# Patient Record
Sex: Male | Born: 1935 | Race: White | Hispanic: No | Marital: Married | Smoking: Never smoker
Health system: Southern US, Community
[De-identification: ages and names within clinical notes are randomized; demographics above are authoritative.]

## PROBLEM LIST (undated history)

## (undated) ENCOUNTER — Emergency Department (HOSPITAL_COMMUNITY): Disposition: A | Discharge status: Home / Self Care | Payer: Medicare Other

## (undated) DIAGNOSIS — N400 Enlarged prostate without lower urinary tract symptoms: Secondary | ICD-10-CM

## (undated) DIAGNOSIS — I779 Disorder of arteries and arterioles, unspecified: Secondary | ICD-10-CM

## (undated) DIAGNOSIS — I639 Cerebral infarction, unspecified: Secondary | ICD-10-CM

## (undated) DIAGNOSIS — R55 Syncope and collapse: Secondary | ICD-10-CM

## (undated) DIAGNOSIS — R42 Dizziness and giddiness: Secondary | ICD-10-CM

## (undated) DIAGNOSIS — E785 Hyperlipidemia, unspecified: Secondary | ICD-10-CM

## (undated) DIAGNOSIS — T7840XA Allergy, unspecified, initial encounter: Secondary | ICD-10-CM

## (undated) DIAGNOSIS — I10 Essential (primary) hypertension: Secondary | ICD-10-CM

## (undated) DIAGNOSIS — I739 Peripheral vascular disease, unspecified: Secondary | ICD-10-CM

## (undated) DIAGNOSIS — R0609 Other forms of dyspnea: Secondary | ICD-10-CM

## (undated) DIAGNOSIS — I8390 Asymptomatic varicose veins of unspecified lower extremity: Secondary | ICD-10-CM

## (undated) DIAGNOSIS — M1990 Unspecified osteoarthritis, unspecified site: Secondary | ICD-10-CM

## (undated) DIAGNOSIS — Z1211 Encounter for screening for malignant neoplasm of colon: Secondary | ICD-10-CM

## (undated) DIAGNOSIS — R0600 Dyspnea, unspecified: Secondary | ICD-10-CM

## (undated) DIAGNOSIS — C61 Malignant neoplasm of prostate: Secondary | ICD-10-CM

## (undated) DIAGNOSIS — K469 Unspecified abdominal hernia without obstruction or gangrene: Secondary | ICD-10-CM

## (undated) HISTORY — DX: Encounter for screening for malignant neoplasm of colon: Z12.11

## (undated) HISTORY — DX: Cerebral infarction, unspecified: I63.9

## (undated) HISTORY — DX: Dyspnea, unspecified: R06.00

## (undated) HISTORY — DX: Malignant neoplasm of prostate: C61

## (undated) HISTORY — DX: Essential (primary) hypertension: I10

## (undated) HISTORY — DX: Unspecified osteoarthritis, unspecified site: M19.90

## (undated) HISTORY — DX: Allergy, unspecified, initial encounter: T78.40XA

## (undated) HISTORY — DX: Syncope and collapse: R55

## (undated) HISTORY — DX: Unspecified abdominal hernia without obstruction or gangrene: K46.9

## (undated) HISTORY — DX: Hyperlipidemia, unspecified: E78.5

## (undated) HISTORY — PX: TOTAL HIP ARTHROPLASTY: SHX124

## (undated) HISTORY — DX: Dizziness and giddiness: R42

## (undated) HISTORY — DX: Asymptomatic varicose veins of unspecified lower extremity: I83.90

## (undated) HISTORY — DX: Disorder of arteries and arterioles, unspecified: I77.9

## (undated) HISTORY — DX: Other forms of dyspnea: R06.09

## (undated) HISTORY — PX: VEIN LIGATION AND STRIPPING: SHX2653

## (undated) HISTORY — DX: Benign prostatic hyperplasia without lower urinary tract symptoms: N40.0

## (undated) HISTORY — PX: PROSTATE BIOPSY: SHX241

## (undated) HISTORY — DX: Peripheral vascular disease, unspecified: I73.9

## (undated) HISTORY — PX: INGUINAL HERNIA REPAIR: SUR1180

---

## 1979-08-21 HISTORY — PX: HEMORRHOID SURGERY: SHX153

## 1990-12-20 HISTORY — PX: VEIN LIGATION AND STRIPPING: SHX2653

## 1997-12-20 HISTORY — PX: LUMBAR MICRODISCECTOMY: SHX99

## 1998-10-10 ENCOUNTER — Encounter: Payer: Self-pay | Admitting: Neurosurgery

## 1998-10-10 ENCOUNTER — Ambulatory Visit (HOSPITAL_COMMUNITY): Admission: RE | Admit: 1998-10-10 | Discharge: 1998-10-10 | Payer: Self-pay | Admitting: Neurosurgery

## 1998-11-03 ENCOUNTER — Ambulatory Visit (HOSPITAL_COMMUNITY): Admission: RE | Admit: 1998-11-03 | Discharge: 1998-11-03 | Payer: Self-pay | Admitting: Sports Medicine

## 1998-11-03 ENCOUNTER — Encounter: Payer: Self-pay | Admitting: Sports Medicine

## 1998-11-05 ENCOUNTER — Ambulatory Visit (HOSPITAL_COMMUNITY): Admission: RE | Admit: 1998-11-05 | Discharge: 1998-11-05 | Payer: Self-pay | Admitting: Neurology

## 1998-11-05 ENCOUNTER — Encounter: Payer: Self-pay | Admitting: Neurology

## 1998-11-19 HISTORY — PX: ANTERIOR CERVICAL DECOMP/DISCECTOMY FUSION: SHX1161

## 1998-11-26 ENCOUNTER — Encounter: Payer: Self-pay | Admitting: Neurosurgery

## 1998-12-01 ENCOUNTER — Encounter: Payer: Self-pay | Admitting: Neurosurgery

## 1998-12-01 ENCOUNTER — Inpatient Hospital Stay (HOSPITAL_COMMUNITY): Admission: RE | Admit: 1998-12-01 | Discharge: 1998-12-02 | Payer: Self-pay | Admitting: Neurosurgery

## 1998-12-25 ENCOUNTER — Ambulatory Visit (HOSPITAL_COMMUNITY): Admission: RE | Admit: 1998-12-25 | Discharge: 1998-12-25 | Payer: Self-pay | Admitting: Neurosurgery

## 1998-12-25 ENCOUNTER — Encounter: Payer: Self-pay | Admitting: Neurosurgery

## 1999-01-02 ENCOUNTER — Ambulatory Visit (HOSPITAL_COMMUNITY): Admission: RE | Admit: 1999-01-02 | Discharge: 1999-01-02 | Payer: Self-pay | Admitting: Neurosurgery

## 1999-01-15 ENCOUNTER — Encounter: Admission: RE | Admit: 1999-01-15 | Discharge: 1999-04-15 | Payer: Self-pay | Admitting: Neurosurgery

## 1999-02-03 ENCOUNTER — Encounter: Payer: Self-pay | Admitting: Neurosurgery

## 1999-02-03 ENCOUNTER — Ambulatory Visit (HOSPITAL_COMMUNITY): Admission: RE | Admit: 1999-02-03 | Discharge: 1999-02-03 | Payer: Self-pay | Admitting: Neurosurgery

## 1999-03-10 ENCOUNTER — Encounter: Payer: Self-pay | Admitting: Sports Medicine

## 1999-03-10 ENCOUNTER — Ambulatory Visit (HOSPITAL_COMMUNITY): Admission: RE | Admit: 1999-03-10 | Discharge: 1999-03-10 | Payer: Self-pay | Admitting: Sports Medicine

## 1999-03-30 ENCOUNTER — Inpatient Hospital Stay (HOSPITAL_COMMUNITY): Admission: RE | Admit: 1999-03-30 | Discharge: 1999-04-02 | Payer: Self-pay | Admitting: Orthopedic Surgery

## 1999-03-30 ENCOUNTER — Encounter: Payer: Self-pay | Admitting: Orthopedic Surgery

## 1999-04-02 ENCOUNTER — Inpatient Hospital Stay (HOSPITAL_COMMUNITY)
Admission: RE | Admit: 1999-04-02 | Discharge: 1999-04-10 | Payer: Self-pay | Admitting: Physical Medicine & Rehabilitation

## 1999-04-03 ENCOUNTER — Encounter: Payer: Self-pay | Admitting: Physical Medicine & Rehabilitation

## 1999-04-06 ENCOUNTER — Encounter: Payer: Self-pay | Admitting: Physical Medicine & Rehabilitation

## 1999-06-08 ENCOUNTER — Encounter: Admission: RE | Admit: 1999-06-08 | Discharge: 1999-07-20 | Payer: Self-pay | Admitting: Orthopedic Surgery

## 1999-07-21 ENCOUNTER — Encounter: Admission: RE | Admit: 1999-07-21 | Discharge: 1999-07-31 | Payer: Self-pay | Admitting: Orthopedic Surgery

## 2000-09-23 ENCOUNTER — Encounter: Payer: Self-pay | Admitting: Orthopedic Surgery

## 2000-09-29 ENCOUNTER — Inpatient Hospital Stay (HOSPITAL_COMMUNITY): Admission: RE | Admit: 2000-09-29 | Discharge: 2000-10-04 | Payer: Self-pay | Admitting: Orthopedic Surgery

## 2000-09-29 ENCOUNTER — Encounter: Payer: Self-pay | Admitting: Orthopedic Surgery

## 2001-03-21 ENCOUNTER — Encounter (INDEPENDENT_AMBULATORY_CARE_PROVIDER_SITE_OTHER): Payer: Self-pay | Admitting: Specialist

## 2001-03-21 ENCOUNTER — Other Ambulatory Visit: Admission: RE | Admit: 2001-03-21 | Discharge: 2001-03-21 | Payer: Self-pay | Admitting: Gastroenterology

## 2004-12-20 DIAGNOSIS — K469 Unspecified abdominal hernia without obstruction or gangrene: Secondary | ICD-10-CM

## 2004-12-20 HISTORY — DX: Unspecified abdominal hernia without obstruction or gangrene: K46.9

## 2005-02-09 ENCOUNTER — Ambulatory Visit: Payer: Self-pay | Admitting: Family Medicine

## 2005-02-23 ENCOUNTER — Ambulatory Visit: Payer: Self-pay | Admitting: Family Medicine

## 2005-12-21 ENCOUNTER — Other Ambulatory Visit: Payer: Self-pay

## 2005-12-21 ENCOUNTER — Ambulatory Visit: Payer: Self-pay | Admitting: General Surgery

## 2006-02-17 HISTORY — PX: CARDIAC CATHETERIZATION: SHX172

## 2006-03-03 ENCOUNTER — Ambulatory Visit: Payer: Self-pay | Admitting: General Surgery

## 2006-03-23 ENCOUNTER — Ambulatory Visit: Payer: Self-pay | Admitting: Family Medicine

## 2006-12-20 DIAGNOSIS — I639 Cerebral infarction, unspecified: Secondary | ICD-10-CM

## 2006-12-20 DIAGNOSIS — R42 Dizziness and giddiness: Secondary | ICD-10-CM

## 2006-12-20 HISTORY — DX: Cerebral infarction, unspecified: I63.9

## 2006-12-20 HISTORY — DX: Dizziness and giddiness: R42

## 2006-12-20 HISTORY — PX: COLONOSCOPY: SHX174

## 2007-08-14 ENCOUNTER — Ambulatory Visit: Payer: Self-pay | Admitting: Gastroenterology

## 2007-08-28 ENCOUNTER — Encounter: Payer: Self-pay | Admitting: Gastroenterology

## 2007-08-28 ENCOUNTER — Ambulatory Visit: Payer: Self-pay | Admitting: Gastroenterology

## 2007-10-26 ENCOUNTER — Inpatient Hospital Stay (HOSPITAL_COMMUNITY): Admission: EM | Admit: 2007-10-26 | Discharge: 2007-10-30 | Payer: Self-pay | Admitting: Emergency Medicine

## 2007-10-26 ENCOUNTER — Encounter (INDEPENDENT_AMBULATORY_CARE_PROVIDER_SITE_OTHER): Payer: Self-pay | Admitting: *Deleted

## 2007-10-30 ENCOUNTER — Encounter (INDEPENDENT_AMBULATORY_CARE_PROVIDER_SITE_OTHER): Payer: Self-pay | Admitting: Internal Medicine

## 2007-11-07 ENCOUNTER — Inpatient Hospital Stay (HOSPITAL_COMMUNITY): Admission: EM | Admit: 2007-11-07 | Discharge: 2007-11-09 | Payer: Self-pay | Admitting: Emergency Medicine

## 2007-11-13 HISTORY — PX: CARDIOVASCULAR STRESS TEST: SHX262

## 2008-02-29 ENCOUNTER — Ambulatory Visit: Payer: Self-pay | Admitting: General Surgery

## 2008-03-07 ENCOUNTER — Ambulatory Visit: Payer: Self-pay | Admitting: General Surgery

## 2008-11-26 ENCOUNTER — Inpatient Hospital Stay (HOSPITAL_COMMUNITY): Admission: EM | Admit: 2008-11-26 | Discharge: 2008-11-28 | Payer: Self-pay | Admitting: Emergency Medicine

## 2008-12-20 HISTORY — PX: CATARACT EXTRACTION W/ INTRAOCULAR LENS  IMPLANT, BILATERAL: SHX1307

## 2010-07-23 ENCOUNTER — Ambulatory Visit: Payer: Self-pay | Admitting: Cardiology

## 2011-05-04 ENCOUNTER — Ambulatory Visit: Payer: Self-pay | Admitting: Gastroenterology

## 2011-05-04 NOTE — H&P (Signed)
NAMEDIONICIO, SHELNUTT NO.:  [id number]   MEDICAL RECORD NO.:  [id number]          PATIENT TYPE:  INP   LOCATION:  3705                         FACILITY:  [facility]   PHYSICIAN:  Colleen Can. Deborah Chalk, M.D.DATE OF BIRTH:  [birth year]   DATE OF ADMISSION:  11/07/2007  DATE OF DISCHARGE:                              HISTORY & PHYSICAL   CHIEF COMPLAINT:  Syncope.   HISTORY OF PRESENT ILLNESS:  Sohail is a pleasant 77 year old white male  who was just discharged from the hospital approximately 8 days ago after  having a cerebellar stroke.  At that time he presented with acute  vertigo.  He really had no residual neurologic deficits.  He was noted  to be hypertensive during that admission and was started on Avalide  150/12.5 mg.  Since his discharge time he has basically returned to his  normal activities.  He actually took his wife to a doctor's appointment  yesterday.  He is back driving without problems.  Today he took his  blood pressure and noted it was somewhat low at approximately 106  systolic.  He was feeling dopey-headed.  His daughter called the house  and the patient's wife informed her that he had had several syncopal  episodes.  His blood pressure apparently became lower at 68/40.  EMS was  called.  He was sitting in a chair upon their arrival and then had  another witnessed episode apparently of snoring respirations.  Apparently his heart rate dropped down into the 30s and blood pressure  remained low as well.  Those strips are not available at the present for  review.  He was subsequently brought to the emergency room.  He is  currently in no acute distress.  He is pain-free and neurologically  intact.   PAST MEDICAL HISTORY:  1. Recent cerebellar stroke.  2. Hypertension, recently started on Avalide.  3. Hypercholesterolemia, recently started on Zocor.  4. Osteoarthritis.  5. History of multiple vein strippings due to varicose veins.  6. Hemorrhoid  surgery.  7. Lumbar microdiskectomy.  8. Anterior C5-6 diskectomy.  9. Bilateral hip surgery.  10.Right inguinal hernia surgery.   ALLERGIES:  DARVOCET.   MEDICINES:  1. Aspirin 325 mg a day.  2. Avalide 150/12.5 mg daily.  3. Simvastatin 40 mg a day.   FAMILY HISTORY:  Unchanged.   SOCIAL HISTORY:  He is married.  He is retired from [employer].  He has  no alcohol or tobacco use.   REVIEW OF SYSTEMS:  As noted above and is otherwise unremarkable.   EXAM:  He is pleasant, elderly white male who is in no acute distress.  Blood pressure is 137/68, his heart rate is in the 60s, respiratory rate  is 18, he is afebrile.  SKIN:  Warm and dry.  Color is unremarkable.  LUNGS:  Fairly clear.  HEART:  A regular rhythm.  ABDOMEN:  Soft.  EXTREMITIES:  Without edema.  NEUROLOGIC:  No gross focal deficits.   Labs are pending.  His EKG shows first-degree AV block.   OVERALL IMPRESSION:  1. Syncope.  2. Recent cerebellar stroke.  3. Hypertension, recently started on Avalide.  4. Hyperlipidemia.   PLAN:  He will be admitted to the service of Dr. Ronny Flurry.  He will  be placed on telemetry.  Avalide will be held.  We will check  orthostatic blood pressures.  The patient may, in fact, need permanent  pacemaker implantation.      Sharlee Blew, N.P.      Colleen Can. Deborah Chalk, M.D.  Electronically Signed    LC/MEDQ  D:  11/07/2007  T:  11/08/2007  Job:  [id number]   cc:   Cassell Clement, M.D.

## 2011-05-04 NOTE — H&P (Signed)
Matthew Bright, Matthew Bright                ACCOUNT NO.:  [id number]   MEDICAL RECORD NO.:  [id number]          PATIENT TYPE:  OBV   LOCATION:  6707                         FACILITY:  [facility]   PHYSICIAN:  Darryl D. Prime, MD    DATE OF BIRTH:  09-26-1926   DATE OF ADMISSION:  11/25/2008  DATE OF DISCHARGE:                              HISTORY & PHYSICAL   PRIMARY CARE PHYSICIAN:  Dr. Chandra Batch.   CARDIOLOGIST:  Dr. Patty Sermons.   Patient is full code.   CHIEF COMPLAINT:  Passed out.   HISTORY OF PRESENT ILLNESS:  Mr. Matthew Bright is an 77 year old male with a  history of possible vasovagal syncope who was at a meeting at church and  think he ate too much.  He had a huge meal and shortly thereafter felt  weak and groggy, then passed out.  A former nurse there noted he did not  have a pulse for about a minute.  He was brought to the emergency room  here.  He came to after a minute with no signs of depressive  neurological status.  He denies any chest pain or shortness of breath  prior to the episode.  In the emergency room, he was given aspirin and  IV fluids.  He was not orthostatic there.   PAST MEDICAL HISTORY/PAST SURGICAL HISTORY:  1. He has a history of syncope believed to be possibly vasovagal in      November 2008 having negative cath in 2007.  2. The patient has a history of orthostatic hypertension related to      AVALIDE which was discontinued.  3. History of hyperlipidemia.  4. History of multiple vein stripping due to varicose veins.  5. He has a history of possible cerebella stroke in 2008.  6. History of hemorrhoid surgery.  7. History of anterior C5-C6 diskectomy.  8. History of bilateral hip surgery.  9. He has a history of right inguinal hernia surgery.  10.History of lumbar microlaminectomy.  11.He has a history of PFO by echo.  EF 60% at that time which was in      November 2008.   ALLERGIES:  He is allergic to DARVOCET.   MEDICATIONS HE IS ON:  1. Lisinopril 5 mg  daily.  2. He is on lovastatin 20 mg daily.   FAMILY HISTORY:  Positive for coronary artery disease in his siblings.   SOCIAL HISTORY:  He is married with no history of tobacco, alcohol or  drug use.  He used to work and [employer].   REVIEW OF SYSTEMS:  A 14-point review of systems is negative or as  stated above.   PHYSICAL EXAMINATION:  VITAL SIGNS:  Temperature is 97.9 with a blood  pressure of 142/73.  Pulse is 70.  Respiratory rate of 16.  Sats are 98%  on room air.  GENERAL:  Patient is a male who looks much younger than his stated age  lying flat in bed in no acute distress.  HEENT:  Normocephalic, atraumatic.  Pupils are equal, round and reactive  to light.  Extraocular movements intact.  The  oropharynx shows no  posterior pharyngeal lesions.  NECK:  Supple with no lymphadenopathy or thyromegaly.  No carotid  bruits.  No jugular venous distention.  LUNGS:  Clear to auscultation bilaterally.  CARDIOVASCULAR:  Regular rhythm and rate with no murmurs, rubs or  gallops.  Normal S1, S2.  No S3 or S4.  ABDOMEN:  Soft, nontender, nondistended.  No hepatosplenomegaly.  EXTREMITIES:  No clubbing, cyanosis or edema.  NEUROLOGIC:  He is alert and oriented x4 with cranial nerves 2-12  grossly intact.  Strength and sensation are grossly intact.   LABORATORY DATA:  Shows a sodium of 137 with a potassium of 4.3,  chloride 104, bicarb of 25, BUN 19, creatinine of 1.45.  The increase  significantly compared to 10/2007 creatinine which was 1.  White count  of 9.3 with a hemoglobin of 13.6, hematocrit 41, platelets 223, calcium  is 9.3, cardiac markers at 2200 for a CK-MB of 9.3.  Chest x-ray is  negative.  EKG shows normal sinus rhythm with a vent rate of 77 beats  per minute, PR interval 217 with a left anterior vesicular block.   ASSESSMENT AND PLAN:  This is a patient, 77 year old male with a history  of possible vasovagal syncope who after eating a heavy meal, much  heavier than he  usually eats, had another episode of syncope.  He came  to quickly.  EKG is unremarkable.  He had no chest pain prior or after.  He does not have orthostatic hypotension at this time, although he was  given IV fluids in the emergency room.  He has hypertension.  At this  time, we will give him IV fluids if he does have signs of azotemia and  will place him on telemetry for at least 24 hours to rule out  arrhythmias.  He will likely be okay to have him discharged on his home  medications as prescribed.  If his blood pressure continues to be  difficult to control, we may get cardiology involved given the  combination of vasovagal episodes and hypertension.      Darryl D. Prime, MD  Electronically Signed     DDP/MEDQ  D:  11/26/2008  T:  11/26/2008  Job:  [id number]

## 2011-05-04 NOTE — Consult Note (Signed)
Matthew Bright, RACCA NO.:  [id number]   MEDICAL RECORD NO.:  [id number]          PATIENT TYPE:  INP   LOCATION:  6524                         FACILITY:  [facility]   PHYSICIAN:  Cassell Clement, M.D. DATE OF BIRTH:  12/01/1926   DATE OF CONSULTATION:  10/26/2007  DATE OF DISCHARGE:                                 CONSULTATION   HISTORY:  This is a 77 year old Caucasian gentleman, a resident of  [place], [place], who was admitted to [facility] with acute vertigo  and was found incidentally to have an elevated CK-MB and total CK but  normal troponin-I.  There is no history of any known prior heart  disease.  The patient gives a history that he had a cardiac  catheterization at [facility] in 2007 and was found to have no significant  disease at that time and was allowed to proceed with elective inguinal  herniorrhaphy after the catheterization results were known.  The patient  denies any history of chest pain or angina or exertional dyspnea.   FAMILY HISTORY:  Positive for coronary disease in siblings, but all of  them were heavy smokers.   SOCIAL HISTORY:  He lives in [place].  He is retired.  He does not  use alcohol or tobacco.   PAST MEDICAL HISTORY:  Multiple orthopedic surgical procedures.  The  patient also has a past history of vertigo, usually responsive to  Antivert but he did not have any with him this time.   REVIEW OF SYSTEMS:  Otherwise unremarkable.   PHYSICAL EXAM:  Blood pressure 140/50, pulse 73, regular, normal sinus  rhythm, respirations are normal.  SKIN:  Clear.  HEENT:  Negative.  No nystagmus.  Carotids normal.  Jugular venous pressure normal.  Thyroid normal.  CHEST:  Clear.  HEART:  No murmur, gallop, rub or click.  ABDOMEN:  Soft.  Liver and spleen not enlarged.  EXTREMITIES:  Good pedal pulses.  No edema or phlebitis.   His electrocardiogram taken today at 0626 as well as a repeat at 2032  show normal sinus rhythm, left anterior  hemiblock, variable degree of  incomplete right bundle branch block with poor R-wave progression, V1  through V4, but no acute ST-T wave changes.  There is been no  significant change in the tracing since previous EKG of September 23, 2000.   His 2-D echo shows normal left ventricular systolic function with an  ejection fraction of 60% and no wall motion abnormalities.   Chest x-ray not yet done and will be done this evening.   LABORATORY STUDIES:  CK and CK-MB of 11.6 and 12.7 with troponin-I of  0.03 and 0.03.   IMPRESSION:  1. Vertigo with possible recent underlying stroke.  2. No good evidence for an acute myocardial infarction with slight      bump in MB but normal troponin, no EKG changes, no chest pain and      essentially normal catheterization in 2007.  It may be that the      slight bump in MB reflects some mild brain damage from recent  stroke.   RECOMMENDATIONS:  Chest x-ray will be done tonight.  We will check  another set of enzymes in the morning as well as get a last EKG in the  morning.  At this point there is no indication for recatheterization.  Would consider a follow-up Cardiolite stress test as an outpatient in  the office after his acute neurologic workup and vertigo problems have  been resolved.  Would certainly continue him on daily aspirin.   Thanks for the opportunity to see this gentleman with you.           ______________________________  Cassell Clement, M.D.     TB/MEDQ  D:  10/26/2007  T:  10/27/2007  Job:  [id number]   cc:   [employer]

## 2011-05-04 NOTE — H&P (Signed)
NAMETANISH, PRIEN NO.:  [id number]   MEDICAL RECORD NO.:  [id number]          PATIENT TYPE:  EMS   LOCATION:  MAJO                         FACILITY:  [facility]   PHYSICIAN:  Michaelyn Barter, M.D. DATE OF BIRTH:  [birth year]   DATE OF ADMISSION:  10/26/2007  DATE OF DISCHARGE:                              HISTORY & PHYSICAL   PRIMARY CARE PHYSICIAN:  Unassigned.   CHIEF COMPLAINT:  Dizziness.   HISTORY OF PRESENT ILLNESS:  Mr. Lennox is a 77 year old gentleman who  states that he felt fine up until approximately 9:00 to 10:00 this  evening.  He indicates that he was lying in bed when he suddenly began  to feel dizzy.  He attempted to sit alongside of the bed but indicates  that the dizziness became worse.  He indicates that whenever he would  attempt to turn his head from side-to-side, he would become  significantly more dizzy.  After sitting up and feeling dizzy, he  decided to lay back down.  While sitting up, he began to feel clammy.  Therefore, he decided to call EMS.  He states that earlier today he felt  fine.  He attended his typical church/bible study and was okay.  He  states that he has had some dry heaves.  He denies any shortness of  breath.  No fevers or chills.  No diarrhea.  No other complaints.   PAST MEDICAL HISTORY:  1. Multiple vein stripping in both of his lower extremities secondary      to varicose veins.  2. Hemorrhoid repair.  3. Lumbar microdiskectomy.  4. Anterior C5-6 diskectomy.  5. Right hip repair by Dr. Eulah Pont.  6. Left hip repair by Dr. Eulah Pont.  7. Right inguinal hernia repair.   HOSPITAL MEDICATIONS:  None.   ALLERGIES:  DARVOCET.   SOCIAL HISTORY:  Cigarettes the patient denies.  Alcohol the patient  denies.   FAMILY HISTORY:  Father had a heart attack, had multiple CVAs.  He died  at the age of 52 secondary to CVA.  Mother died at age 70 after an MI.  Brother had bone cancer.   REVIEW OF SYSTEMS:  As per HPI.   PHYSICAL EXAMINATION:  GENERAL:  The patient is awake.  He is lying  flat.  He shows no obvious respiratory distress.  He is cooperative.  VITAL SIGNS:  Blood pressure 173/86, temperature 96.7, heart rate 64,  respirations 19, O2 saturation 100%.  HEENT:  Normocephalic, atraumatic.  Anicteric.  Extraocular movements  intact.  Oral mucosa is pink.  No exudates.  Partials are present.  NECK:  No JVD.  Supple.  No lymphadenopathy.  No thyromegaly.  CARDIAC:  S1, S2 present.  Regular rate and rhythm.  No murmurs,  gallops, rubs.  ABDOMEN:  Flat, soft, nontender, nondistended.  Positive bowel sounds x4  quadrants.  No masses palpated.  No hepatosplenomegaly .  EXTREMITIES:  Trace bilateral leg edema.  NEUROLOGICAL:  The patient is alert and oriented x3, cranial nerves II-  XII are intact excluding gag reflexes not tested.  There is no facial  droop.  No focal motor deficit.  Grip strength is equal bilaterally.   LABORATORY DATA:  The patient's sodium is 135, potassium 3.7, chloride  102, CO2 25, glucose 126, BUN 19, creatinine 0.90, bilirubin total 0.7,  alk-phos 57, SGOT 35, SGPT 26, total protein 6.5, albumin 3.6, calcium  8.7.  PTT 27, PT 13.9, INR 1.1.  White blood cell count is 12.1,  hemoglobin 12.7, hematocrit 38.5, platelets 229.  CT scan of the  patient's head revealed hypoattenuation in the inferior right cerebellar  hemisphere.  Acute ischemia could have this appearance.  MRI was  recommended.   ASSESSMENT/PLAN:  1. Acute onset of vertigo.  Etiology of this is questionable.  The      patient indicated that she had vertigo in the past.  CT scan of the      patient's head has been completed.  Findings are somewhat      inconclusive.  Will order a MRI of the head to further delineate      the questionable findings on CT scan of patient's head.  Will      provide the patient with IV fluids for now.  Will start Antivert.      Will check a 2D echocardiogram, carotid Dopplers as well  as a TSH,      T4, RPR and B-12.  2. Nausea.  Will provide the patient with p.r.n. antiemetics for now.  3. Gastrointestinal prophylaxis.  Will provide Protonix.  4. Deep vein thrombosis prophylaxis.  Will provide Lovenox.      Michaelyn Barter, M.D.  Electronically Signed     OR/MEDQ  D:  10/26/2007  T:  10/26/2007  Job:  [id number]

## 2011-05-04 NOTE — Discharge Summary (Signed)
NAMECAEDON, BOND NO.:  [id number]   MEDICAL RECORD NO.:  [id number]          PATIENT TYPE:  INP   LOCATION:  3705                         FACILITY:  [facility]   PHYSICIAN:  Cassell Clement, M.D. DATE OF BIRTH:  1926-01-29   DATE OF ADMISSION:  11/07/2007  DATE OF DISCHARGE:  11/09/2007                               DISCHARGE SUMMARY   FINAL DIAGNOSES:  1. Syncope.  2. Recent cerebellar strokes.  3. Hypercholesterolemia.  4. Orthostatic hypotension  5. History of multiple vein stripping in the past.   OPERATIONS PERFORMED:  None.   HISTORY OF PRESENT ILLNESS:  This 77 year old gentleman presented to the  emergency room after having episode of syncope at home.  He had been  discharged 8 days previously after being hospitalized on the hospitalist  service for acute vertigo and was diagnosed as having recent cerebellar  strokes.  No source of his strokes was found, and the patient was  discharged on aspirin.  He was also discharged at that time on Avalide,  Zocor and aspirin.  He had been doing well until the day of admission  when he developed dizziness and subsequent syncope at home.  He was  suspected of having orthostatic hypertension.  He came to emergency room  where he was evaluated by Dr. Deborah Chalk and admitted.  His heart rhythm  remained stable during the hospital stay orthostatic blood pressures  were monitored and once Avalide was stopped, there was no evidence of  any significant orthostatic drop.  He did have a slight bump in his CK-  MB of 10.5, 7.1 and 5.3 with troponins remaining normal and 0.02, 0.02  and 0.03.  His initial white count was elevated at 13,100 and follow-up  white counts were normal.  On November 19, the day after admission,  sitting blood pressure was 150/70 and standing was 140/70.  He was begun  on low-dose lisinopril without a diuretic.  He tolerated ambulation in  the hall.  The following day, he remained stable and blood  pressures  were 130/60 supine, 130/60 sitting and 136/60 standing.  It was felt  that the patient was stable for discharge.  He will be followed closely  in the office.   DISCHARGE MEDICATIONS:  1. Aspirin 325 one daily.  2. Simvastatin 40 mg each evening.  3. Lisinopril 5 mg daily.   DISCHARGE INSTRUCTIONS:  Patient is to wear support hose.  He is to  return to the office as previously scheduled for a stress Cardiolite on  Monday November 13, 2007.  He will be on a low sodium heart healthy  diet.  He is to increase activity as tolerated.  He is to stop the  Avalide that he has at home.   LABORATORY STUDIES:  On discharge, hemoglobin 12.5, white count 8800,  sodium 132, potassium 3.9, BUN 16, creatinine 1.04.  liver function  studies are normal.  TSH is normal at 2.36.  His urinalysis was benign.   CONDITION ON DISCHARGE:  Improved.           ______________________________  Cassell Clement, M.D.  TB/MEDQ  D:  11/09/2007  T:  11/09/2007  Job:  [id number]

## 2011-05-04 NOTE — Discharge Summary (Signed)
NAMEERRICK, SALTS NO.:  [id number]   MEDICAL RECORD NO.:  [id number]          PATIENT TYPE:  INP   LOCATION:  3031                         FACILITY:  [facility]   PHYSICIAN:  Lonia Blood, M.D.       DATE OF BIRTH:  1926-09-18   DATE OF ADMISSION:  10/25/2007  DATE OF DISCHARGE:  10/30/2007                               DISCHARGE SUMMARY   DISCHARGE DIAGNOSES:  1. Acute cerebellar strokes.  2. Hypertension.  3. Hyperlipidemia.  4. Osteoarthritis status post multiple surgeries.   DISCHARGE MEDICATIONS:  1. Aspirin 325 mg daily.  2. Avalide 150/12.5 mg  daily.  3. Simvastatin 40 mg at bedtime.   CONDITION ON DISCHARGE:  Matthew Bright was discharged in good condition.  At the time of discharge, the patient had no neurological deficits.  The  patient will follow up with Dr. Patty Sermons.   PROCEDURES DURING THIS ADMISSION:  1. On September 26, 2007, the patient underwent MRI of the brain with      findings of acute right pica territory infarct with occlusion of      the distal right vertebral artery.  2. On September 25, 2007, a transthoracic echocardiogram with findings of      68% ejection fraction.  3. On September 29, 2007, transesophageal echocardiogram by Dr. Reyes Ivan      with findings of 60% ejection fraction, normal-sized left atrium,      no thrombi in the left atrial appendage, minimal PFO with right-to-      left shunt, normal right atrium without thrombus, normal-appearing      pericardium.   CONSULTATIONS DURING THIS ADMISSION:  The patient was seen in  consultation by Dr. Patty Sermons from [facility].   HISTORY AND PHYSICAL:  For admission history and physical, refer to the  dictated H&P done by Dr. Michaelyn Barter on October 26, 2007.   HOSPITAL COURSE:  Problem 1.  Acute onset vertigo.  Matthew Bright was  admitted and observed on the telemetry unit.  His MRI of the brain  indicated an acute cerebellar stroke.  The patient was fully  investigated with  MRI, MRA, carotid Dopplers, as well as a  transesophageal echocardiogram.  No clear source for emboli was  identified.  Decision was made to treat the patient with aspirin as well  as antihypertensive and antilipidemics.  Matthew Bright neurological  deficits were quick-lived, and they did not require TPA.   Problem 2.  Hypertension.  Throughout this admission, the patient had a  persistent elevation of blood pressure measurements.  He seems to have  an isolated systolic hypertension problem.  We have elected to treat the  patient with a combination an ARB and diuretic, and the patient can  follow up with Dr. Patty Sermons.   Problem 3.  Hyperlipidemia.  A measured fasting lipid panel revealed the  presence of an LDL of 124.  Given the patient's acute stroke, we have  elected to treat the patient with a statin on a daily basis.   Problem 4.  Severe osteoarthritis.  We have checked Matthew Bright for the  possibility of hemochromatosis and rheumatoid arthritis, but it seems  that his arthritis is just severe osteoarthritis.  Matthew Bright was  evaluated by physical therapy and occupational therapy, and they did not  identify any acute and/or chronic needs.   Matthew Bright is discharged in good condition on October 30, 2007 and  instructed to follow up with his primary care physician as well as with  Dr. Patty Sermons.      Lonia Blood, M.D.  Electronically Signed     SL/MEDQ  D:  10/30/2007  T:  10/31/2007  Job:  [id number]   cc:   Cassell Clement, M.D.

## 2011-05-04 NOTE — Discharge Summary (Signed)
Matthew Bright, Matthew Bright NO.:  [id number]   MEDICAL RECORD NO.:  [id number]          PATIENT TYPE:  INP   LOCATION:  6707                         FACILITY:  [facility]   PHYSICIAN:  Altha Harm, MDDATE OF BIRTH:  [birth year]   DATE OF ADMISSION:  11/25/2008  DATE OF DISCHARGE:  11/28/2008                               DISCHARGE SUMMARY   DISCHARGE DISPOSITION:  Home.   FINAL DISCHARGE DIAGNOSES:  1. Syncope.  2. Hypertension.  3. Hyperlipidemia.  4. History of orthostatic hypotension related to Avalide use.  5. History of patent foramen ovale by echocardiogram with an ejection      fraction of 60% in November 2008.   SECONDARY DIAGNOSES:  1. History of multiple vein stripping due to varicosities.  2. History of cerebellar stroke in 2008.  3. History of hemorrhoid surgery.  4. History of anterior C5-C6 diskectomy.  5. History of bilateral hip surgery.  6. History of right inguinal hernia surgery.  7. History of lumbar microlaminectomy.   DISCHARGE MEDICATIONS:  Include the following:  1. Lisinopril 5 mg p.o. daily.  2. Lovastatin 20 mg p.o. daily.  3. Aspirin 81 mg p.o. daily.   CONSULTANT:  Dr. Patty Sermons:  Phone consultation.   PROCEDURES:  None.   DIAGNOSTIC STUDIES:  Portable chest x-ray done at time of admission  showed borderline heart size without acute cardiopulmonary disease.   CODE STATUS:  Full code.   ALLERGIES:  DARVOCET.   PRIMARY CARE PHYSICIAN:  Dr. Cassell Clement   CHIEF COMPLAINT:  Passing out.   HISTORY OF PRESENT ILLNESS:  Please refer to the H and P dictated by Dr.  Oralia Rud for details of the HPI.   HOSPITAL COURSE:  The patient was admitted after having passed out while  at church.  The patient describes his passing out episode as being out  for approximately maybe 30 seconds.  He states that during this time, he  had no awareness of anything occurring around him and was not able to  hear any voices or anything occurring.   The patient came to during that  episode and immediately was oriented x3.  The patient himself postulates  that this is secondary to eating a meal.  However, in all of his years,  he has never had a reaction like this to eating.  The physician who  admitted the patient reviewed the patient's records at the time, and the  patient has had a recent MRI and CT of the brain done within the last  year.  Thus, the decision was made not to repeat them as the patient has  had no focal neurological deficits.   The patient was observed under telemetry monitoring during his hospital  stay and had no arrhythmias.  In reviewing the patient's records, he had  a duplex ultrasound done in November 2008, approximately 1 year ago,  which showed bilaterally no significant plaque visualized.  There was no  ICA stenosis.  There was in the right vertebral artery thumping Doppler  signal consistent with a possible distal occlusion.  In the left  vertebral  artery, flow was antegrade.  An MRA of the head was performed  at that time, and the findings suggested an occlusion of the distal  right vertebral artery of unknown acuity, and an MRA of the neck was  recommended for further evaluation.  A 2-D echocardiogram was done also  in 2008 which showed atrial septum aneurysm with a small patent foramen  ovale and recommended aspirin or antiplatelet medication indefinitely  for stroke risk factor modifications made at that time.  In light of the  fact that these studies were done only 1 year ago, they were not  repeated at this time.  The patient had no further syncope here in the  hospital.  I discussed with the patient the possibility of having the  MRA of the neck done here in the hospital, and the patient is reluctant  to partake with this study.  I discussed it with Dr. Patty Sermons, and I am  going to recommend that the patient have the MRA of the neck done as an  outpatient to determine at that time of any further  intervention is  necessary and whether or not this could possibly be the source of the  cause of syncope for this patient.   At this time, the patient will be discharged at his request.  I  discussed this all with Dr. Patty Sermons, and I will recommend an  outpatient MRA of the neck performed to further evaluate this.  In  addition, Dr. Patty Sermons has instructed that the patient should call him  upon discharge from the hospital and follow up with him for possible  Holter monitor if necessary.   DIETARY RESTRICTION:  The patient should be on a 2 gram sodium diet, low-  cholesterol diet.   PHYSICAL RESTRICTIONS:  None.   FOLLOWUP:  The patient is to call Dr. Yevonne Pax office today to  schedule an appointment with him.  In addition, the patient is to follow  up with having the MRA of his neck performed.      Altha Harm, MD  Electronically Signed     MAM/MEDQ  D:  11/28/2008  T:  11/28/2008  Job:  [id number]   cc:   Cassell Clement, M.D.

## 2011-05-07 NOTE — Discharge Summary (Signed)
[place]. [facility]  Patient:    Matthew Bright, Matthew Bright                       MRN: [id number] Adm. Date:  [id number] Disc. Date: [id number] Attending:  Colbert Ewing Dictator:   Oris Drone. Petrarca, P.A.-C.                           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  DISCHARGE DIAGNOSIS:  Advanced degenerative joint disease of left hip.  PROCEDURE:  Left total hip replacement.  HISTORY OF PRESENT ILLNESS:  A 77 year old white male seen for evaluation of his left hip.  He is status post right total hip replacement with good results.  He is now having persistent pain in the left hip. Radiographic evidence is noted to be bone on bone.  He has pain with activities of daily living as well as with every step.  Now indicated for left total hip replacement.  HOSPITAL COURSE:  A 77 year old white male admitted September 29, 2000, after appropriate laboratory studies were obtained as well as 1 g of Ancef IV on call to the operating room, was taken to the operating room where he underwent a left total hip replacement.  He tolerated the procedure well.  He was continued postoperatively on Ancef prophylaxis.  Heparin and Coumadin as per pharmacy protocol for antithrombotic prophylaxis was ordered.  PT and OT rehab consults were ordered.  Foley catheter was placed intraoperative.  Physical therapy for ambulation, weightbearing as tolerated on the left.  He had an unremarkable postoperative course.  On October 01, 2000, he had his PCA and IV discontinued and he was placed on oral pain medications.  He did well and it was felt that he was not a candidate for rehab and he was discharged on October 04, 2000, to return back to the office in 10 days for follow-up.  EKG showed normal sinus rhythm with left axis deviation.  Nonspecific T-wave abnormalities noted.  Postoperative total hip replacement with no evidence of  acute complications on x-ray.  Chest x-ray showed basilar atelectasis or infiltrate left worse than right on April 02, 2000.  LABORATORY STUDIES:  September 23, 2000, showed hemoglobin 14.1, hematocrit 38.9%, white count 8400, platelets 215,000.  October 03, 2000, showed hemoglobin 10.7, hematocrit 30.6%, white count 8000, platelets 235,000. Chemistries of September 23, 2000, showed a sodium 138, potassium 4.0, chloride 104, CO2 28, glucose 193, BUN 14, creatinine 1.0, calcium 8.8, total protein 6.3, albumin 3.6, AST 33, ALT 24, ALP 64, total bilirubin 0.6.  Discharge sodium 137, potassium 3.9, chloride 105, CO2 28, glucose 98, BUN 15, creatinine 0.5, calcium 8.3.  Urinalysis was benign for a voided urine.  He was blood type A positive, antibody screen negative.  DISCHARGE INSTRUCTIONS:  He was given a prescription for Coumadin 5 mg as dosed by [facility], Lortab one tablet q.4h. p.r.n. pain.  Iron sulfate 325 one p.o. daily with a meal.  Continue his home medications.  He may be up with his walker.  He should drink plenty of fluids.  He should keep his wound clean and dry.  FOLLOW-UP:  He will follow back [place] in 10 days for recheck evaluation.  DISCHARGE CONDITION:  Improved. DD:  10/24/00 TD:  10/25/00 Job: [id number]

## 2011-05-07 NOTE — Op Note (Signed)
[place]  Patient:    Matthew Bright, Matthew Bright                       MRN: [id number] Proc. Date: 09/29/00 Adm. Date:  [id number] Attending:  Colbert Ewing                           Operative Report  PREOPERATIVE DIAGNOSIS:  End-stage degenerative joint disease, left hip.  POSTOPERATIVE DIAGNOSIS:  End-stage degenerative joint disease, left hip.  PROCEDURE:  Left total hip replacement utilizing Osteonics prosthesis. Press-fit 60 mm acetabular component.  Screw fixation x 2.  A 10-degree polyethylene insert.  Cemented #9 femoral component, Eon type.  A 132 degree neck angle.  A -3 head.  SURGEON:  Loreta Ave, M.D.  ASSISTANT:  Arlys John D. Petrarca, P.A.-C.  ANESTHESIA:  General.  ESTIMATED BLOOD LOSS:  500 cc.  BLOOD GIVEN:  None.  SPECIMENS:  Excised bone and soft tissue.  CULTURES:  None.  COMPLICATIONS:  None.  DRESSING:  Soft compressive with abduction pillow.  DESCRIPTION OF PROCEDURE:  Patient brought to the operating room and placed on the operating table in supine position.  After adequate anesthesia had been obtained, turned to the lateral position, prepped and draped in the usual sterile fashion.  Posterolateral approach.  Incision along the lateral aspect of the femur, extending posterior superior.  Skin and subcutaneous tissue divided and hemostasis obtained with electrocautery.  The iliotibial band exposed and incised.  Charnley retractor put in place.  Neurovascular structures identified and protected.  External rotators and capsule taken down off the back of the intertrochanteric groove and tagged with #1 Ethibond.  Hip dislocated posteriorly.  Grade 4 changes throughout.  Femoral neck cut in line with the definitive prosthesis one finger breadth above the lesser trochanter.  Acetabulum exposed.  All debris and soft tissue were removed.  Sequential reaming up to good bleeding bone and adequate inferior and medial  placement. Fitted for a 60 mm cup, which was hammered in place, then placed in 45 degrees of abduction and 20 of anteversion.  Fixation augmented with two screws 20 mm above, 16 mm posterior.  A 10-degree insert placed through the overhang, placed posterosuperior.  Retractor was removed.  Femur exposed.  Sequential reaming with the hand-held and power reamers up to a #9 cemented component. Trial reduction with the #9 component with a 40 mm neck, 132 degree neck angle, and -3 head yielded excellent stability in flexion and extension and excellent restoration of leg length.  Trial was removed.  Cement restrictor placed 2 cm distal to the definitive femoral component.  Copious irrigation with the pulse irrigating device.  Cement placed in the femoral shaft under pressure technique.  Femoral component seated in place, excessive cement removed.  Once the cement had hardened, the femoral head was attached. The hip was reduced.  Excellent stability in flexion and extension with good restoration of leg lengths.  External rotators and capsule were repaired through drill holes in the back of the intertrochanteric groove and tied over bone.  Wound irrigated.  The iliotibial band closed with #1 Vicryl, skin and subcutaneous tissue with Vicryl and staples.  The margins of the wound injected with Marcaine.  Sterile compressive dressing was applied.  Returned to the supine position.  Abduction pillow placed.  Anesthesia reversed, brought to the recovery room.  Tolerated the surgery well.  No complications. DD:  09/29/00 TD:  10/01/00 Job: [id number]

## 2011-07-21 ENCOUNTER — Other Ambulatory Visit: Payer: Self-pay | Admitting: Cardiology

## 2011-09-23 LAB — URINALYSIS, ROUTINE W REFLEX MICROSCOPIC
Glucose, UA: 100 mg/dL — AB
Hgb urine dipstick: NEGATIVE
Ketones, ur: 15 mg/dL — AB
Protein, ur: NEGATIVE mg/dL
Specific Gravity, Urine: 1.026 (ref 1.005–1.030)
Urobilinogen, UA: 1 mg/dL (ref 0.0–1.0)
pH: 6 (ref 5.0–8.0)

## 2011-09-23 LAB — BASIC METABOLIC PANEL
BUN: 14 mg/dL (ref 6–23)
CO2: 25 mEq/L (ref 19–32)
Calcium: 8.5 mg/dL (ref 8.4–10.5)
Calcium: 9.3 mg/dL (ref 8.4–10.5)
Chloride: 104 mEq/L (ref 96–112)
Chloride: 104 mEq/L (ref 96–112)
Chloride: 107 mEq/L (ref 96–112)
GFR calc Af Amer: >60 mL/min (ref >60)
GFR calc Af Amer: >60 mL/min (ref >60)
GFR calc non Af Amer: 47 mL/min — ABNORMAL LOW (ref >60)
GFR calc non Af Amer: >60 mL/min (ref >60)
Potassium: 3.9 mEq/L (ref 3.5–5.1)

## 2011-09-23 LAB — CK TOTAL AND CKMB (NOT AT ARMC)
CK, MB: 10.7 ng/mL — ABNORMAL HIGH (ref 0.3–4.0)
Total CK: 389 U/L — ABNORMAL HIGH (ref 7–232)

## 2011-09-23 LAB — DIFFERENTIAL
Basophils Absolute: 0.1 10*3/uL (ref 0.0–0.1)
Basophils Relative: 1 % (ref 0–1)
Eosinophils Relative: 5 % (ref 0–5)
Monocytes Absolute: 0.7 10*3/uL (ref 0.1–1.0)
Neutro Abs: 7 10*3/uL (ref 1.7–7.7)
Neutrophils Relative %: 75 % (ref 43–77)

## 2011-09-23 LAB — CBC
Hemoglobin: 12.9 g/dL — ABNORMAL LOW (ref 13.0–17.0)
Hemoglobin: 13.6 g/dL (ref 13.0–17.0)
MCHC: 34 g/dL (ref 30.0–36.0)
MCV: 90.8 fL (ref 78.0–100.0)
MCV: 91.3 fL (ref 78.0–100.0)
Platelets: 198 10*3/uL (ref 150–400)
RDW: 14.1 % (ref 11.5–15.5)
WBC: 8 10*3/uL (ref 4.0–10.5)
WBC: 9.3 10*3/uL (ref 4.0–10.5)

## 2011-09-23 LAB — POCT CARDIAC MARKERS: CKMB, poc: 9.3 ng/mL (ref 1.0–8.0)

## 2011-09-23 LAB — CARDIAC PANEL(CRET KIN+CKTOT+MB+TROPI)
Relative Index: 2.6 — ABNORMAL HIGH (ref 0.0–2.5)
Total CK: 340 U/L — ABNORMAL HIGH (ref 7–232)

## 2011-09-28 LAB — BASIC METABOLIC PANEL
CO2: 27
Chloride: 104
Chloride: 97
Chloride: 105
Creatinine, Ser: 1.04
Creatinine, Ser: 1.05
Creatinine, Ser: 1.1
GFR calc Af Amer: >60
GFR calc Af Amer: >60
GFR calc Af Amer: >60
GFR calc non Af Amer: >60
Potassium: 3.9
Sodium: 132 — ABNORMAL LOW

## 2011-09-28 LAB — DIFFERENTIAL
Basophils Relative: 0
Basophils Relative: 0
Eosinophils Absolute: 0.1
Lymphs Abs: 0.9
Monocytes Relative: 3
Neutrophils Relative %: 87 — ABNORMAL HIGH
Neutrophils Relative %: 89 — ABNORMAL HIGH

## 2011-09-28 LAB — I-STAT 8, (EC8 V) (CONVERTED LAB)
Acid-Base Excess: 2
BUN: 21
HCT: 43
Hemoglobin: 14.6
Operator id: 198171
Potassium: 4.4
Sodium: 135
TCO2: 32
pCO2, Ven: 56.9 — ABNORMAL HIGH
pH, Ven: 7.327 — ABNORMAL HIGH

## 2011-09-28 LAB — COMPREHENSIVE METABOLIC PANEL
ALT: 26
AST: 32
Albumin: 3.6
Alkaline Phosphatase: 57
CO2: 25
Calcium: 9.1
Creatinine, Ser: 1.16
GFR calc Af Amer: >60
GFR calc Af Amer: >60
GFR calc non Af Amer: >60
Glucose, Bld: 126 — ABNORMAL HIGH
Total Bilirubin: 0.7
Total Protein: 6.6
Total Protein: 6.5

## 2011-09-28 LAB — CBC
HCT: 36.9 — ABNORMAL LOW
HCT: 39.4
HCT: 38.5 — ABNORMAL LOW
Hemoglobin: 12.5 — ABNORMAL LOW
Hemoglobin: 13.6
MCHC: 33.9
MCHC: 33
MCV: 89.4
MCV: 90
MCV: 90.2
MCV: 89.7
MCV: 90.8
Platelets: 239
Platelets: 229
RBC: 4.1 — ABNORMAL LOW
RBC: 4.18 — ABNORMAL LOW
RBC: 4.13 — ABNORMAL LOW
RDW: 13.5
RDW: 14.4 — ABNORMAL HIGH
WBC: 13.1 — ABNORMAL HIGH
WBC: 8.4
WBC: 8.6

## 2011-09-28 LAB — CK TOTAL AND CKMB (NOT AT ARMC)
CK, MB: 11.6 — ABNORMAL HIGH
CK, MB: 7.2 — ABNORMAL HIGH
Relative Index: 3.5 — ABNORMAL HIGH
Relative Index: 3.6 — ABNORMAL HIGH
Total CK: 205

## 2011-09-28 LAB — CARDIAC PANEL(CRET KIN+CKTOT+MB+TROPI)
CK, MB: 5.3 — ABNORMAL HIGH
CK, MB: 12.7 — ABNORMAL HIGH
Relative Index: 2.7 — ABNORMAL HIGH
Relative Index: 3.4 — ABNORMAL HIGH
Relative Index: 4.1 — ABNORMAL HIGH
Troponin I: 0.02
Troponin I: 0.03
Troponin I: 0.02
Troponin I: 0.03

## 2011-09-28 LAB — TROPONIN I
Troponin I: 0.02
Troponin I: 0.03
Troponin I: 0.03

## 2011-09-28 LAB — URINALYSIS, ROUTINE W REFLEX MICROSCOPIC
Protein, ur: NEGATIVE
Urobilinogen, UA: 1

## 2011-09-28 LAB — APTT: aPTT: 27

## 2011-09-28 LAB — LIPID PANEL
LDL Cholesterol: 124 — ABNORMAL HIGH
VLDL: 9

## 2011-09-28 LAB — PROTIME-INR: INR: 1.1

## 2011-09-28 LAB — T4: T4, Total: 8.5

## 2011-09-28 LAB — FERRITIN: Ferritin: 27 (ref 22–322)

## 2011-09-28 LAB — POCT CARDIAC MARKERS: CKMB, poc: 3.6

## 2011-09-28 LAB — POCT I-STAT CREATININE: Creatinine, Ser: 1.4

## 2011-10-19 ENCOUNTER — Emergency Department (HOSPITAL_COMMUNITY)
Admission: EM | Admit: 2011-10-19 | Discharge: 2011-10-19 | Disposition: A | Discharge status: Home / Self Care | Payer: Medicare Other | Attending: Emergency Medicine | Admitting: Emergency Medicine

## 2011-10-19 ENCOUNTER — Emergency Department (HOSPITAL_COMMUNITY): Payer: Medicare Other

## 2011-10-19 DIAGNOSIS — Y92009 Unspecified place in unspecified non-institutional (private) residence as the place of occurrence of the external cause: Secondary | ICD-10-CM | POA: Insufficient documentation

## 2011-10-19 DIAGNOSIS — I10 Essential (primary) hypertension: Secondary | ICD-10-CM | POA: Insufficient documentation

## 2011-10-19 DIAGNOSIS — R296 Repeated falls: Secondary | ICD-10-CM | POA: Insufficient documentation

## 2011-10-19 DIAGNOSIS — R55 Syncope and collapse: Secondary | ICD-10-CM | POA: Insufficient documentation

## 2011-10-19 DIAGNOSIS — S0180XA Unspecified open wound of other part of head, initial encounter: Secondary | ICD-10-CM | POA: Insufficient documentation

## 2011-10-19 DIAGNOSIS — Z79899 Other long term (current) drug therapy: Secondary | ICD-10-CM | POA: Insufficient documentation

## 2011-10-19 LAB — URINALYSIS, ROUTINE W REFLEX MICROSCOPIC
Bilirubin Urine: NEGATIVE
Glucose, UA: NEGATIVE mg/dL
Ketones, ur: NEGATIVE mg/dL
Leukocytes, UA: NEGATIVE
Specific Gravity, Urine: 1.012 (ref 1.005–1.030)
pH: 7.5 (ref 5.0–8.0)

## 2011-10-19 LAB — DIFFERENTIAL
Basophils Absolute: 0 10*3/uL (ref 0.0–0.1)
Basophils Relative: 0 % (ref 0–1)
Eosinophils Absolute: 0.2 10*3/uL (ref 0.0–0.7)
Eosinophils Relative: 2 % (ref 0–5)
Monocytes Absolute: 0.7 10*3/uL (ref 0.1–1.0)
Neutro Abs: 8.2 10*3/uL — ABNORMAL HIGH (ref 1.7–7.7)

## 2011-10-19 LAB — COMPREHENSIVE METABOLIC PANEL
ALT: 22 U/L (ref 0–53)
Albumin: 3.5 g/dL (ref 3.5–5.2)
Alkaline Phosphatase: 62 U/L (ref 39–117)
Calcium: 8.9 mg/dL (ref 8.4–10.5)
Potassium: 3.9 mEq/L (ref 3.5–5.1)
Sodium: 134 mEq/L — ABNORMAL LOW (ref 135–145)
Total Protein: 6.5 g/dL (ref 6.0–8.3)

## 2011-10-19 LAB — CBC
MCHC: 34.3 g/dL (ref 30.0–36.0)
Platelets: 190 10*3/uL (ref 150–400)
RDW: 13.2 % (ref 11.5–15.5)

## 2011-10-20 ENCOUNTER — Telehealth: Payer: Self-pay | Admitting: Cardiology

## 2011-10-20 NOTE — Telephone Encounter (Signed)
New message  Pt called and said he passed out yesterday and went to hospital  He called to tell dr Patty Sermons

## 2011-10-20 NOTE — Telephone Encounter (Signed)
Spoke with patient and scheduled office visit with Lawson Fiscal for 11/2

## 2011-10-21 ENCOUNTER — Encounter: Payer: Self-pay | Admitting: Nurse Practitioner

## 2011-10-22 ENCOUNTER — Encounter: Payer: Self-pay | Admitting: Nurse Practitioner

## 2011-10-22 ENCOUNTER — Ambulatory Visit (INDEPENDENT_AMBULATORY_CARE_PROVIDER_SITE_OTHER): Payer: Medicare Other | Admitting: Nurse Practitioner

## 2011-10-22 VITALS — BP 160/82 | HR 73 | Ht 70.0 in | Wt 165.0 lb

## 2011-10-22 DIAGNOSIS — R9431 Abnormal electrocardiogram [ECG] [EKG]: Secondary | ICD-10-CM

## 2011-10-22 DIAGNOSIS — I10 Essential (primary) hypertension: Secondary | ICD-10-CM

## 2011-10-22 DIAGNOSIS — R55 Syncope and collapse: Secondary | ICD-10-CM

## 2011-10-22 NOTE — Progress Notes (Signed)
    Matthew Bright Date of Birth: September 23, 1926 Medical Record #[id number]  History of Present Illness: Matthew Bright is seen today for a work in visit. He is seen for Dr. Patty Sermons. He was in the ER this past Tuesday after having a syncopal episode. CT showed atrophy and small vessel disease, no acute intracranial abnormality and ethmoid sinus disease. He did suffer a laceration and has a black right eye with stitches. He has had prior syncope back in 2008 and there was mention made of low blood pressure and low heart rate at that time. His Avalide was stopped and he had no further problems. Rhythm remained stable and pacemaker was deferred.   He now presents with recurrent syncope. He had been feeling just fine up until this past Tuesday. He had eaten breakfast. He started to feel a little "funny". He got up and went towards the den, but passed out and hit the door. His glasses broke. He has a black right eye and a laceration that required stitches. Glucose was 82. No mention of his blood pressure or pulse. He did not have any incontinence. No seizure activity. EMS was called. He was discharged from the ER.   Current Outpatient Prescriptions on File Prior to Visit  Medication Sig Dispense Refill  . aspirin 81 MG tablet Take 81 mg by mouth daily.        Marland Kitchen lisinopril (PRINIVIL,ZESTRIL) 5 MG tablet TAKE ONE TABLET BY MOUTH TWICE DAILY  60 tablet  5    Allergies  Allergen Reactions  . Avalide (Irbesartan-Hydrochlorothiazide)   . Darvocet (Propoxyphene N-Acetaminophen)   . Lovastatin   . Zocor (Simvastatin)     Past Medical History  Diagnosis Date  . Hyperlipidemia   . Enlarged prostate   . Prostate cancer   . Exertional dyspnea   . Syncope and collapse October 2012  . Hypertension   . Cerebellar stroke, acute 2008  . S/P cardiac cath     Cath at [facility] several years ago with no significant CAD noted.     Past Surgical History  Procedure Date  . Prostate biopsy     X2  . Vein ligation and  stripping   . Hip surgery   . Lumbar laminectomy   . Hemorrhoid surgery   . Cervical discectomy     ANTERIOR C5-C6  . Inguinal hernia repair   . Cardiovascular stress test 11/13/2007    EF 67%, NO ISCHEMIA     History  Smoking status  . Never Smoker   Smokeless tobacco  . Not on file    History  Alcohol Use No    No family history on file.  Review of Systems: The review of systems is positive for recurrent syncope. No chest pain.  All other systems were reviewed and are negative.  Physical Exam: BP 160/80  Pulse 73  Ht 5\' 10"  (1.778 m)  Wt 165 lb (74.844 kg)  BMI 23.68 kg/m2 Patient is very pleasant and in no acute distress. Skin is warm and dry. Color is normal.  HEENT is unremarkable. Normocephalic/atraumatic. PERRL. Sclera are nonicteric. Neck is supple. No masses. No JVD. Lungs are clear. Cardiac exam shows a regular rate and rhythm. Abdomen is soft. Extremities are without edema. Gait and ROM are intact. No gross neurologic deficits noted.   LABORATORY DATA: EKG is reviewed with Dr. Patty Sermons and shows sinus rhythm with a first degree AV block.    Assessment / Plan:

## 2011-10-22 NOTE — Assessment & Plan Note (Addendum)
He has recurrent syncope. There has been concern in the past for bradycardia. I have discussed his case with Dr. Patty Sermons and reviewed the EKG. We will place an event monitor and check an echo. We will see him back in 1 month. If his monitor is ok, may need to consider loop recorder implantation. CBC and CMET was ok. CT is reviewed. Further disposition to follow. He is advised to not drive. Patient is agreeable to this plan and will call if any problems develop in the interim.

## 2011-10-22 NOTE — Assessment & Plan Note (Signed)
Has sinus with first degree AV block. Will place event monitor for recurrent syncope. I am concerned about bradycardia/pauses.

## 2011-10-22 NOTE — Patient Instructions (Addendum)
We are going to get an ultrasound of your heart.  I am going to have you wear a heart monitor for the next month to look at your heart rhythm  You should not drive over this next month.  We will see you back in a month.  Call for any problems.

## 2011-10-22 NOTE — Assessment & Plan Note (Signed)
Blood pressure is up a little but I have left his medicines as they are with this recent episode.

## 2011-10-27 ENCOUNTER — Ambulatory Visit (HOSPITAL_COMMUNITY): Payer: Medicare Other | Attending: Cardiovascular Disease | Admitting: Radiology

## 2011-10-27 DIAGNOSIS — I10 Essential (primary) hypertension: Secondary | ICD-10-CM | POA: Insufficient documentation

## 2011-10-27 DIAGNOSIS — I079 Rheumatic tricuspid valve disease, unspecified: Secondary | ICD-10-CM | POA: Insufficient documentation

## 2011-10-27 DIAGNOSIS — I379 Nonrheumatic pulmonary valve disorder, unspecified: Secondary | ICD-10-CM | POA: Insufficient documentation

## 2011-10-27 DIAGNOSIS — I080 Rheumatic disorders of both mitral and aortic valves: Secondary | ICD-10-CM | POA: Insufficient documentation

## 2011-10-27 DIAGNOSIS — R55 Syncope and collapse: Secondary | ICD-10-CM | POA: Insufficient documentation

## 2011-11-02 ENCOUNTER — Telehealth: Payer: Self-pay | Admitting: *Deleted

## 2011-11-02 NOTE — Telephone Encounter (Signed)
Advised of echo results 

## 2011-11-02 NOTE — Telephone Encounter (Signed)
Message copied by Burnell Blanks on Tue Nov 02, 2011 10:06 AM ------      Message from: Cassell Clement      Created: Wed Oct 27, 2011  8:20 PM       Echo okay.  Pl report.

## 2011-11-03 ENCOUNTER — Other Ambulatory Visit (HOSPITAL_COMMUNITY): Payer: Medicare Other | Admitting: Radiology

## 2011-11-30 ENCOUNTER — Ambulatory Visit (INDEPENDENT_AMBULATORY_CARE_PROVIDER_SITE_OTHER): Payer: Medicare Other | Admitting: Cardiology

## 2011-11-30 ENCOUNTER — Other Ambulatory Visit: Payer: Self-pay | Admitting: *Deleted

## 2011-11-30 ENCOUNTER — Other Ambulatory Visit: Payer: Medicare Other | Admitting: *Deleted

## 2011-11-30 ENCOUNTER — Encounter: Payer: Self-pay | Admitting: Cardiology

## 2011-11-30 VITALS — BP 150/70 | HR 76 | Ht 70.5 in | Wt 163.1 lb

## 2011-11-30 DIAGNOSIS — M791 Myalgia, unspecified site: Secondary | ICD-10-CM

## 2011-11-30 DIAGNOSIS — IMO0001 Reserved for inherently not codable concepts without codable children: Secondary | ICD-10-CM

## 2011-11-30 DIAGNOSIS — R55 Syncope and collapse: Secondary | ICD-10-CM

## 2011-11-30 DIAGNOSIS — I10 Essential (primary) hypertension: Secondary | ICD-10-CM

## 2011-11-30 DIAGNOSIS — I119 Hypertensive heart disease without heart failure: Secondary | ICD-10-CM

## 2011-11-30 DIAGNOSIS — E7800 Pure hypercholesterolemia, unspecified: Secondary | ICD-10-CM

## 2011-11-30 LAB — BASIC METABOLIC PANEL
Calcium: 8.8 mg/dL (ref 8.4–10.5)
GFR: 87.81 mL/min (ref >60.00)
Glucose, Bld: 81 mg/dL (ref 70–99)
Potassium: 4.1 mEq/L (ref 3.5–5.1)
Sodium: 134 mEq/L — ABNORMAL LOW (ref 135–145)

## 2011-11-30 LAB — CBC WITH DIFFERENTIAL/PLATELET
Basophils Relative: 0.6 % (ref 0.0–3.0)
Eosinophils Relative: 3.3 % (ref 0.0–5.0)
HCT: 37.1 % — ABNORMAL LOW (ref 39.0–52.0)
Hemoglobin: 12.7 g/dL — ABNORMAL LOW (ref 13.0–17.0)
Lymphocytes Relative: 17.1 % (ref 12.0–46.0)
Lymphs Abs: 1.3 10*3/uL (ref 0.7–4.0)
Monocytes Relative: 7.4 % (ref 3.0–12.0)
Neutro Abs: 5.3 10*3/uL (ref 1.4–7.7)
RBC: 4.07 Mil/uL — ABNORMAL LOW (ref 4.22–5.81)
RDW: 13.6 % (ref 11.5–14.6)
WBC: 7.3 10*3/uL (ref 4.5–10.5)

## 2011-11-30 LAB — LIPID PANEL
HDL: 58.3 mg/dL (ref >39.00)
Triglycerides: 84 mg/dL (ref 0.0–149.0)
VLDL: 16.8 mg/dL (ref 0.0–40.0)

## 2011-11-30 LAB — CARDIAC PANEL: Relative Index: 2.9 calc — ABNORMAL HIGH (ref 0.0–2.5)

## 2011-11-30 LAB — HEPATIC FUNCTION PANEL
ALT: 34 U/L (ref 0–53)
AST: 44 U/L — ABNORMAL HIGH (ref 0–37)
Albumin: 4 g/dL (ref 3.5–5.2)
Total Bilirubin: 0.7 mg/dL (ref 0.3–1.2)

## 2011-11-30 NOTE — Progress Notes (Signed)
Matthew Bright Date of Birth:  September 21, 1926 [facility] [address]   [address] [place], [place]  [address] [phone]           Fax   [phone]  History of Present Illness: This pleasant elderly gentleman is seen for a scheduled followup office visit.  He has been in good general health.  He had an episode of syncope on 10/18/11 while in his kitchen.  He came in the next day to office and was seen by Norma Fredrickson  NP and he had a 30 day outpatient event monitor which did not show any arrhythmias.  He also had a two-dimensional echocardiogram done on 10/27/11 which was unremarkable.  Of note is the fact that the patient had had a prior episode of syncope in 2008.  At that time it was felt that it may have been related to hypovolemia and at that time his Avalide had been stopped.  This time in retrospect the patient remembers that about a week prior to his syncopal spell he had given blood.  Prior to giving blood and they checked his blood pressure and it was lower than usual for him.  The patient has had no further episodes of syncope since 10/18/11.  One week ago he tripped on a drain pipe in his garage and fell and reinjured the right side of his face and was seen in a urgent care setting where his laceration was repaired with glue.  Current Outpatient Prescriptions  Medication Sig Dispense Refill  . aspirin 81 MG tablet Take 81 mg by mouth daily.        . B Complex-Biotin-FA (B COMPLEX 100 TR) TBCR Take by mouth daily.        . Coenzyme Q10 (COQ10 PO) Take by mouth daily.        Marland Kitchen lisinopril (PRINIVIL,ZESTRIL) 5 MG tablet TAKE ONE TABLET BY MOUTH TWICE DAILY  60 tablet  5  . Misc Natural Products (ADVANCED JOINT RELIEF PO) Take by mouth daily.        . niacin 500 MG tablet Take 500 mg by mouth daily with breakfast.        . NON FORMULARY Healthy prostate 4 tablets twice a day         Allergies  Allergen Reactions  . Avalide (Irbesartan-Hydrochlorothiazide)     . Darvocet (Propoxyphene N-Acetaminophen)   . Lovastatin   . Zocor (Simvastatin)     Patient Active Problem List  Diagnoses  . Syncope  . HTN (hypertension)  . Abnormal EKG    History  Smoking status  . Never Smoker   Smokeless tobacco  . Not on file    History  Alcohol Use No    No family history on file.  Review of Systems: Constitutional: no fever chills diaphoresis or fatigue or change in weight.  Head and neck: no hearing loss, no epistaxis, no photophobia or visual disturbance. Respiratory: No cough, shortness of breath or wheezing. Cardiovascular: No chest pain peripheral edema, palpitations. Gastrointestinal: No abdominal distention, no abdominal pain, no change in bowel habits hematochezia or melena. Genitourinary: No dysuria, no frequency, no urgency, no nocturia. Musculoskeletal:No arthralgias, no back pain, no gait disturbance or myalgias. Neurological: No dizziness, no headaches, no numbness, no seizures, no syncope, no weakness, no tremors. Hematologic: No lymphadenopathy, no easy bruising. Psychiatric: No confusion, no hallucinations, no sleep disturbance.    Physical Exam: Filed Vitals:   11/30/11 1038  BP: 150/70  Pulse: 76  the general appearance reveals a well-developed well-nourished elderly gentleman in no distress.  He does have significant residual ecchymosis on the right side of his face and around his eye secondary to his fall in the garage one week ago today.Pupils equal and reactive.   Extraocular Movements are full.  There is no scleral icterus.  The mouth and pharynx are normal.  The neck is supple.  The carotids reveal no bruits.  The jugular venous pressure is normal.  The thyroid is not enlarged.  There is no lymphadenopathy.  The chest is clear to percussion and auscultation. There are no rales or rhonchi. Expansion of the chest is symmetrical.  The precordium is quiet.  The first heart sound is normal.  The second heart sound is  physiologically split.  There is no murmur gallop rub or click.  There is no abnormal lift or heave.  The abdomen is soft and nontender. Bowel sounds are normal. The liver and spleen are not enlarged. There Are no abdominal masses. There are no bruits.  ExtremitiesStrength is normal and symmetrical in all extremities.  There is no lateralizing weakness.  There are no sensory deficits.  Integument is normal except for the facial bruising   Assessment / Plan: Continue same medication.  Blood work today is pending.  He may start to drive her car again now.  I have advised him not to donate blood in the future.  Recheck in 4 months for followup office visit and EKG

## 2011-11-30 NOTE — Assessment & Plan Note (Signed)
I reviewed his 30 day monitor which did not detect any arrhythmias.  His rhythm is normal sinus rhythm.  He said no further episodes of syncope since October.

## 2011-11-30 NOTE — Assessment & Plan Note (Signed)
The patient has a past history of high blood pressure and is on low-dose lisinopril 5 mg daily.  He has not had any side effects or dizziness from the present therapy.

## 2011-11-30 NOTE — Patient Instructions (Signed)
Do NOT give blood in the future You may drive now Your physician recommends that you continue on your current medications as directed. Please refer to the Current Medication list given to you today. Will obtain labs today and call you with the results Your physician wants you to follow-up in: 4 months You will receive a reminder letter in the mail two months in advance. If you don't receive a letter, please call our office to schedule the follow-up appointment.

## 2011-11-30 NOTE — Assessment & Plan Note (Signed)
The patient has a past history of hypercholesterolemia.  He is intolerant of statins which caused leg weakness and he did have prolonged elevation of his CK levels.  He is presently controlling his cholesterol with diet alone.

## 2011-12-02 ENCOUNTER — Telehealth: Payer: Self-pay | Admitting: *Deleted

## 2011-12-02 NOTE — Telephone Encounter (Signed)
Message copied by Burnell Blanks on Thu Dec 02, 2011  9:11 AM ------      Message from: Cassell Clement      Created: Tue Nov 30, 2011  9:27 PM       Please report.  Lipids are better. LDL 96 satisfactory.  One of the liver tests is slightly elevated.  This could also be muscle damage from recent fall. Kidney function is normal.  BS normal.      CBC shows mild anemia so avoid giving blood donations in the future.      Muscle enzymes are still elevated--the recent fall a week ago probably contributing.      Continue same meds.

## 2011-12-02 NOTE — Telephone Encounter (Signed)
Advise of labs

## 2011-12-06 ENCOUNTER — Emergency Department (HOSPITAL_COMMUNITY): Payer: Medicare Other

## 2011-12-06 ENCOUNTER — Other Ambulatory Visit: Payer: Self-pay

## 2011-12-06 ENCOUNTER — Inpatient Hospital Stay (HOSPITAL_COMMUNITY)
Admission: EM | Admit: 2011-12-06 | Discharge: 2011-12-08 | DRG: 312 | Disposition: A | Discharge status: Home / Self Care | Payer: Medicare Other | Source: Ambulatory Visit | Attending: Internal Medicine | Admitting: Internal Medicine

## 2011-12-06 ENCOUNTER — Encounter (HOSPITAL_COMMUNITY): Payer: Self-pay | Admitting: Neurology

## 2011-12-06 DIAGNOSIS — W19XXXA Unspecified fall, initial encounter: Secondary | ICD-10-CM | POA: Diagnosis present

## 2011-12-06 DIAGNOSIS — Y998 Other external cause status: Secondary | ICD-10-CM

## 2011-12-06 DIAGNOSIS — R55 Syncope and collapse: Principal | ICD-10-CM

## 2011-12-06 DIAGNOSIS — I10 Essential (primary) hypertension: Secondary | ICD-10-CM

## 2011-12-06 DIAGNOSIS — Z7982 Long term (current) use of aspirin: Secondary | ICD-10-CM

## 2011-12-06 DIAGNOSIS — I6200 Nontraumatic subdural hemorrhage, unspecified: Secondary | ICD-10-CM

## 2011-12-06 DIAGNOSIS — R404 Transient alteration of awareness: Secondary | ICD-10-CM | POA: Diagnosis present

## 2011-12-06 DIAGNOSIS — S065X9A Traumatic subdural hemorrhage with loss of consciousness of unspecified duration, initial encounter: Secondary | ICD-10-CM | POA: Diagnosis present

## 2011-12-06 LAB — COMPREHENSIVE METABOLIC PANEL
Albumin: 3.5 g/dL (ref 3.5–5.2)
Alkaline Phosphatase: 64 U/L (ref 39–117)
BUN: 19 mg/dL (ref 6–23)
Chloride: 98 mEq/L (ref 96–112)
Potassium: 4 mEq/L (ref 3.5–5.1)
Total Bilirubin: 0.4 mg/dL (ref 0.3–1.2)

## 2011-12-06 LAB — POCT I-STAT, CHEM 8
BUN: 20 mg/dL (ref 6–23)
Creatinine, Ser: 1.1 mg/dL (ref 0.50–1.35)
Potassium: 4.1 mEq/L (ref 3.5–5.1)
Sodium: 135 mEq/L (ref 135–145)
TCO2: 27 mmol/L (ref 0–100)

## 2011-12-06 LAB — DIFFERENTIAL
Basophils Relative: 1 % (ref 0–1)
Lymphocytes Relative: 19 % (ref 12–46)
Lymphs Abs: 1.4 10*3/uL (ref 0.7–4.0)
Monocytes Relative: 9 % (ref 3–12)
Neutro Abs: 4.7 10*3/uL (ref 1.7–7.7)
Neutrophils Relative %: 65 % (ref 43–77)

## 2011-12-06 LAB — CBC
Hemoglobin: 12.4 g/dL — ABNORMAL LOW (ref 13.0–17.0)
MCHC: 34.3 g/dL (ref 30.0–36.0)
RBC: 4.06 MIL/uL — ABNORMAL LOW (ref 4.22–5.81)
WBC: 7.3 10*3/uL (ref 4.0–10.5)

## 2011-12-06 LAB — CARDIAC PANEL(CRET KIN+CKTOT+MB+TROPI): Relative Index: 2.8 — ABNORMAL HIGH (ref 0.0–2.5)

## 2011-12-06 LAB — APTT: aPTT: 26 seconds (ref 24–37)

## 2011-12-06 LAB — PROTIME-INR
INR: 1.14 (ref 0.00–1.49)
Prothrombin Time: 14.8 seconds (ref 11.6–15.2)

## 2011-12-06 LAB — CK TOTAL AND CKMB (NOT AT ARMC): Total CK: 402 U/L — ABNORMAL HIGH (ref 7–232)

## 2011-12-06 MED ORDER — SODIUM CHLORIDE 0.9 % IV SOLN
INTRAVENOUS | Status: DC
  Administered 2011-12-06 – 2011-12-07 (×4): via INTRAVENOUS

## 2011-12-06 MED ORDER — SENNA 8.6 MG PO TABS
1.0000 | ORAL_TABLET | Freq: Every evening | ORAL | Status: DC | PRN
  Filled 2011-12-06: qty 1

## 2011-12-06 MED ORDER — ALUM & MAG HYDROXIDE-SIMETH 200-200-20 MG/5ML PO SUSP: 30.0000 mL | Freq: Four times a day (QID) | ORAL | Status: DC | PRN

## 2011-12-06 MED ORDER — DOCUSATE SODIUM 100 MG PO CAPS
100.0000 mg | ORAL_CAPSULE | Freq: Every day | ORAL | Status: DC | PRN
  Filled 2011-12-06: qty 1

## 2011-12-06 MED ORDER — ZOLPIDEM TARTRATE 5 MG PO TABS: 5.0000 mg | ORAL_TABLET | Freq: Every evening | ORAL | Status: DC | PRN

## 2011-12-06 NOTE — ED Provider Notes (Signed)
History     CSN: [id number] Arrival date & time: 12/06/2011 11:20 AM   Chief Complaint  Patient presents with  . Code Stroke   The history is provided by the patient and the EMS personnel. History Limited By: urgent need for intervention.   Pt was seen at 1115.  Pt was seen on arrival to ED.  Per EMS and pt report, c/o syncope and right sided weakness.  While sitting at table talking with his wife at home, pt experienced a brief syncopal episode approx 1015 PTA.  Pt woke up with right sided weakness and left facial droop.  EMS called Code Stroke en route to ED.  Pt has hx of multiple syncopal episodes over the last several months.  Pt endorses he fell against his truck approx 2-3 weeks ago, hitting the right side of his face.  Pt states he was eval at a local [facility] at that time.  Denies CP/palpitations, no SOB/cough, no abd pain, no N/V/D, no seizure activity, no incont of bowel/bladder.     PMD:  Dr. Mila Merry Past Medical History  Diagnosis Date  . Hyperlipidemia   . Enlarged prostate   . Prostate cancer   . Exertional dyspnea   . Syncope and collapse  2008 and again in October 2012  . Hypertension   . Cerebellar stroke, acute 2008  . S/P cardiac cath Jan. 2007    Cath at [facility] several years ago with no significant CAD noted.   . OA (osteoarthritis)   . Varicose veins     Past Surgical History  Procedure Date  . Prostate biopsy     X2  . Vein ligation and stripping   . Hip surgery   . Lumbar laminectomy   . Hemorrhoid surgery   . Cervical discectomy     ANTERIOR C5-C6  . Inguinal hernia repair   . Cardiovascular stress test 11/13/2007    EF 67%, NO ISCHEMIA    History  Substance Use Topics  . Smoking status: Never Smoker   . Smokeless tobacco: Not on file  . Alcohol Use: No    Review of Systems  Unable to perform ROS: Other    Allergies  Avalide; Darvocet; Lovastatin; and Zocor  Home Medications   Current Outpatient Rx  Name Route Sig Dispense Refill    . ASPIRIN 81 MG PO TABS Oral Take 81 mg by mouth daily.      . B COMPLEX 100 TR PO TBCR Oral Take by mouth daily.      . COQ10 PO Oral Take by mouth daily.      Marland Kitchen LISINOPRIL 5 MG PO TABS  TAKE ONE TABLET BY MOUTH TWICE DAILY 60 tablet 5  . ADVANCED JOINT RELIEF PO Oral Take by mouth daily.      Marland Kitchen NIACIN 500 MG PO TABS Oral Take 500 mg by mouth daily with breakfast.      . NON FORMULARY  Healthy prostate 4 tablets twice a day       BP 153/63  Pulse 57  Temp(Src) 97.5 F (36.4 C) (Oral)  Resp 16  SpO2 100%  Physical Exam 1115: Physical examination:  Nursing notes reviewed; Vital signs and O2 SAT reviewed;  Constitutional: Well developed, Well nourished, Well hydrated, In no acute distress; Head:  Normocephalic, +fading ecchymosis right lower face with horizontal healing lac to right zygoma area; Eyes: EOMI, PERRL, No scleral icterus; ENMT: Mouth and pharynx normal, Mucous membranes moist; Neck: Supple, Full range of motion, No  lymphadenopathy; Cardiovascular: Regular rate and rhythm, No murmur, rub, or gallop; Respiratory: Breath sounds clear & equal bilaterally, No rales, rhonchi, wheezes, or rub, Normal respiratory effort/excursion; Chest: Nontender, Movement normal; Abdomen: Soft, Nontender, Nondistended, Normal bowel sounds; Extremities: Pulses normal, No tenderness, 1+ pedal edema bilat, No calf edema or asymmetry.; Neuro: AA&Ox3, Major CN grossly intact. Speech clear, no facial droop.  +RUE and RLE drift on exam.  Strength 5/5 LUE and LLE; Skin: Color normal, Warm, Dry.    ED Course  Procedures  1135:  Neuro Dr. Thad Ranger at bedside for eval.   1200:  Neuro MD has eval, will follow as consultant.  Cancelled code stroke, pt not TPA candidate.  Requests to admit to Medicine service re: recurrent syncopal episodes, consult Neurosurgery re: SDH.  1210:  T/C to Neurosurgery Dr. Gerlene Fee, case discussed, including:  HPI, pertinent PM/SHx, VS/PE, dx testing, ED course and treatment.  He has  reviewed the CT scan images.  No intervention needed at this time.  Agreeable to consult if needed, have inpt service call prn.   1230:  T/C to [facility], case discussed, including:  HPI, pertinent PM/SHx, VS/PE, dx testing, ED course and treatment.  Agreeable to admit.  They will come to ED for eval.     MDM  MDM Reviewed: nursing note and vitals Reviewed previous: ECG Interpretation: ECG, x-ray, labs and CT scan Total time providing critical care: 30-74 minutes. This excludes time spent performing separately reportable procedures and services. Consults: admitting MD, neurosurgery and neurology   CRITICAL CARE Performed by: Laray Anger Total critical care time: 35 Critical care time was exclusive of separately billable procedures and treating other patients. Critical care was necessary to treat or prevent imminent or life-threatening deterioration. Critical care was time spent personally by me on the following activities: development of treatment plan with patient and/or surrogate as well as nursing, discussions with consultants, evaluation of patient's response to treatment, examination of patient, obtaining history from patient or surrogate, ordering and performing treatments and interventions, ordering and review of laboratory studies, ordering and review of radiographic studies, pulse oximetry and re-evaluation of patient's condition.    Date: 12/06/2011  Rate: 61  Rhythm: normal sinus rhythm  QRS Axis: left  Intervals: normal  ST/T Wave abnormalities: normal, artifact  Conduction Disutrbances:nonspecific intraventricular conduction delay  Narrative Interpretation:   Old EKG Reviewed: unchanged; no significant changes from previous EKG dated 11/25/2008.   Results for orders placed during the hospital encounter of 12/06/11  PROTIME-INR      Component Value Range   Prothrombin Time 14.8  11.6 - 15.2 (seconds)   INR 1.14  0.00 - 1.49   APTT      Component Value Range   aPTT  26  24 - 37 (seconds)  CBC      Component Value Range   WBC 7.3  4.0 - 10.5 (K/uL)   RBC 4.06 (*) 4.22 - 5.81 (MIL/uL)   Hemoglobin 12.4 (*) 13.0 - 17.0 (g/dL)   HCT 16.1 (*) 09.6 - 52.0 (%)   MCV 88.9  78.0 - 100.0 (fL)   MCH 30.5  26.0 - 34.0 (pg)   MCHC 34.3  30.0 - 36.0 (g/dL)   RDW 04.5  40.9 - 81.1 (%)   Platelets 204  150 - 400 (K/uL)  DIFFERENTIAL      Component Value Range   Neutrophils Relative 65  43 - 77 (%)   Neutro Abs 4.7  1.7 - 7.7 (K/uL)   Lymphocytes Relative  19  12 - 46 (%)   Lymphs Abs 1.4  0.7 - 4.0 (K/uL)   Monocytes Relative 9  3 - 12 (%)   Monocytes Absolute 0.7  0.1 - 1.0 (K/uL)   Eosinophils Relative 6 (*) 0 - 5 (%)   Eosinophils Absolute 0.5  0.0 - 0.7 (K/uL)   Basophils Relative 1  0 - 1 (%)   Basophils Absolute 0.1  0.0 - 0.1 (K/uL)  COMPREHENSIVE METABOLIC PANEL      Component Value Range   Sodium 132 (*) 135 - 145 (mEq/L)   Potassium 4.0  3.5 - 5.1 (mEq/L)   Chloride 98  96 - 112 (mEq/L)   CO2 28  19 - 32 (mEq/L)   Glucose, Bld 121 (*) 70 - 99 (mg/dL)   BUN 19  6 - 23 (mg/dL)   Creatinine, Ser 1.61  0.50 - 1.35 (mg/dL)   Calcium 9.0  8.4 - 09.6 (mg/dL)   Total Protein 6.6  6.0 - 8.3 (g/dL)   Albumin 3.5  3.5 - 5.2 (g/dL)   AST 38 (*) 0 - 37 (U/L)   ALT 29  0 - 53 (U/L)   Alkaline Phosphatase 64  39 - 117 (U/L)   Total Bilirubin 0.4  0.3 - 1.2 (mg/dL)   GFR calc non Af Amer 65 (*) >90 (mL/min)   GFR calc Af Amer 75 (*) >90 (mL/min)  CK TOTAL AND CKMB      Component Value Range   Total CK 402 (*) 7 - 232 (U/L)   CK, MB 11.2 (*) 0.3 - 4.0 (ng/mL)   Relative Index 2.8 (*) 0.0 - 2.5   TROPONIN I      Component Value Range   Troponin I <0.30  <0.30 (ng/mL)  POCT I-STAT, CHEM 8      Component Value Range   Sodium 135  135 - 145 (mEq/L)   Potassium 4.1  3.5 - 5.1 (mEq/L)   Chloride 100  96 - 112 (mEq/L)   BUN 20  6 - 23 (mg/dL)   Creatinine, Ser 0.45  0.50 - 1.35 (mg/dL)   Glucose, Bld 409 (*) 70 - 99 (mg/dL)   Calcium, Ion 8.11  1.12 -  1.32 (mmol/L)   TCO2 27  0 - 100 (mmol/L)   Hemoglobin 12.9 (*) 13.0 - 17.0 (g/dL)   HCT 91.4 (*) 78.2 - 52.0 (%)   Ct Head Wo Contrast  12/06/2011  *RADIOLOGY REPORT*  Clinical Data: History of previous syncopal episodes.  Episode of unresponsiveness today.  CT HEAD WITHOUT CONTRAST  Technique:  Contiguous axial images were obtained from the base of the skull through the vertex without contrast.  Comparison: 10/19/2011  Findings: There is an iso- to hypodense extracerebral fluid collection involving the left frontal and left parietal regions with maximal thickness of 19 mm (image 27). There is no midline shift.  This extra-axial fluid collection was not present previously and is consistent with a subacute to early chronic subdural hematoma.  There are no right-sided collections.  There is no evidence for acute stroke or parenchymal hemorrhage. There is no hydrocephalus.  Mild atrophy is noted.  There may be an old lacunar infarct involving the right basal ganglia.  The calvarium is intact.  There are no acute sinus fluid collections.  IMPRESSION: Roughly 2 cm thick subacute to early chronic left subdural hematoma.  This was not present on previous exam.  Critical Value/emergent results were called by telephone at the time of interpretation on 12/06/2011  at 11:39 a.m.  to  Dr. Clarene Duke, who verbally acknowledged these results.  Original Report Authenticated By: Elsie Stain, M.D.     [facility] [address] [place] Harry, DO 12/08/11 1105

## 2011-12-06 NOTE — ED Notes (Signed)
OPC to see

## 2011-12-06 NOTE — ED Notes (Signed)
Dr. Clarene Duke informed of CKMB

## 2011-12-06 NOTE — Progress Notes (Signed)
Brought family back to see patient.  Family is concerned because they don't know yet what is causing this episode.  I offered emotional and spiritual support.  Thornell Sartorius 1:49 PM  12/06/11 1300  Clinical Encounter Type  Visited With Patient;Family  Visit Type Spiritual support

## 2011-12-06 NOTE — H&P (Signed)
Hospital Admission Note Date: 12/06/2011  Patient name: Matthew Bright Medical record number: [id number] Date of birth: 02-04-1926 Age: 77 y.o. Gender: male PCP: Matthew Hakim, MD  Medical Service: Internal Medicine Teaching Service-[facility]  Attending physician:  Matthew Righter, MD   1st Contact:  Matthew Bright    Pager: [phone] 2nd Contact:  Matthew Bright   Pager: [phone] After 5 pm or weekends: 1st Contact:      Pager: [phone] 2nd Contact:      Pager: [phone]  Chief Complaint: passed out  History of Present Illness: Matthew Bright is a 77 yo male with PMHx significant for multiple syncopal episodes over the past 3 years and recent fall with resultant trauma to the right side of his face. He presents to the ED today via ambulance after an episode of syncope this morning.  He reports that he was seated having breakfast with his wife and started to feel "dopey headed" then passed out while seated.  The loss of consciousness was complete with rapid onset and short duration. He recovered spontaneously with feeling of right-sided weakness but reports that his right-side has been weak since since a fall 2 weeks ago.   He denies fatigue, sleep or food deprivation, being over heated, alcohol consumption, pain or strong emotions prior to the episode.  His last syncopal episode was in Oct 2012 whereby he sustained trauma to the head with need for sutures, although CT of head was negative.  At that time he had EKG that was without abnormalities and  was evaluated as an outpatient by his Cardiologist Matthew Bright.  Further evaluation revealed a normal TTE and 30-day event monitor which did not show arrythmias.  Of note he had recent fall 2 weeks ago after tripping over something with resultant trauma to right side of his face.  He was evaluated at Urgent Care had films but no CT imagining of the head at that time.  Today he was initially assessed as a code stroke, had CT of the head which demonstrated  subacute/chronic left subdural hematoma.  Meds: No current facility-administered medications on file as of 12/06/2011.   Medications Prior to Admission  Medication Sig Dispense Refill  . aspirin 81 MG tablet Take 81 mg by mouth daily.        . B Complex-Biotin-FA (B COMPLEX 100 TR) TBCR Take by mouth daily.        . Coenzyme Q10 (COQ10 PO) Take by mouth daily.        Marland Kitchen lisinopril (PRINIVIL,ZESTRIL) 5 MG tablet TAKE ONE TABLET BY MOUTH TWICE DAILY  60 tablet  5  . Misc Natural Products (ADVANCED JOINT RELIEF PO) Take by mouth daily.        . niacin 500 MG tablet Take 500 mg by mouth daily with breakfast.        . NON FORMULARY Healthy prostate 4 tablets twice a day         Allergies: Avalide; Darvocet; Lovastatin; and Zocor Past Medical History  Diagnosis Date  . Hyperlipidemia   . Enlarged prostate   . Prostate cancer   . Exertional dyspnea   . Syncope and collapse  2008 and again in October 2012  . Hypertension   . Cerebellar stroke, acute 2008  . S/P cardiac cath Jan. 2007    Cath at [facility] several years ago with no significant CAD noted.   . OA (osteoarthritis)   . Varicose veins    Past Surgical History  Procedure Date  .  Prostate biopsy     X2  . Vein ligation and stripping   . Hip surgery   . Lumbar laminectomy   . Hemorrhoid surgery   . Cervical discectomy     ANTERIOR C5-C6  . Inguinal hernia repair   . Cardiovascular stress test 11/13/2007    EF 67%, NO ISCHEMIA     History   Social History  . Marital Status: Married    Spouse Name: N/A    Number of Children: N/A  . Years of Education: N/A   Occupational History  . Not on file.   Social History Main Topics  . Smoking status: Never Smoker     Review of Systems: Pertinent items are noted in HPI.  Physical Exam: Blood pressure 163/81, pulse 64, temperature 97.7 F (36.5 C), temperature source Oral, resp. rate 16, SpO2 100.00%. General: Well-developed, well-nourished, elderly white male lying on  stretcher with family at bedside in no acute distress;  Head: Normocephalic, signs of prior trauma with multple ecchymoses to tight cheek, zygomatic arch and right peri-orbital area. Eyes/Ears: PERRL, EOMI, No signs of anemia or jaundice, hypertrichosis of external ear canals bilaterally Nose: Mucous membranes moist, not inflammed, nonerythematous. Throat: Oropharynx nonerythematous, no exudate appreciated.  Neck: No deformities, masses, or tenderness noted.Supple, No carotid Bruits, no JVD. Lungs: Normal respiratory effort. Clear to auscultation BL without crackles or wheezes anteriorly. Heart: RRR. S1 and S2 normal without gallop, murmur, or rubs appreciated Abdomen: BS normoactive. Soft, Nondistended, non-tender. No masses or organomegaly. Extremities: trace pretibial edema with bilateral TED hose Neurologic: alert and oriented x3, appropriate and cooperative throughout examination. Strength 5/5 bilateral upper and lower extremities   Lab results: Basic Metabolic Panel:  Basename 12/06/11 1150 12/06/11 1127  NA 135 132*  K 4.1 4.0  CL 100 98  CO2 -- 28  GLUCOSE 116* 121*  BUN 20 19  CREATININE 1.10 1.03  CALCIUM -- 9.0  MG -- --  PHOS -- --   Liver Function Tests:  [facility] 12/06/11 1127  AST 38*  ALT 29  ALKPHOS 64  BILITOT 0.4  PROT 6.6  ALBUMIN 3.5   CBC:  Basename 12/06/11 1150 12/06/11 1127  WBC -- 7.3  NEUTROABS -- 4.7  HGB 12.9* 12.4*  HCT 38.0* 36.1*  MCV -- 88.9  PLT -- 204   Cardiac Enzymes:  Basename 12/06/11 1126  CKTOTAL 402*  CKMB 11.2*  CKMBINDEX --  TROPONINI <0.30    Coagulation:  Basename 12/06/11 1127  LABPROT 14.8  INR 1.14    Imaging results:  Ct Head Wo Contrast  12/06/2011  *RADIOLOGY REPORT*  Clinical Data: History of previous syncopal episodes.  Episode of unresponsiveness today.  CT HEAD WITHOUT CONTRAST  Technique:  Contiguous axial images were obtained from the base of the skull through the vertex without contrast.   Comparison: 10/19/2011  Findings: There is an iso- to hypodense extracerebral fluid collection involving the left frontal and left parietal regions with maximal thickness of 19 mm (image 27). There is no midline shift.  This extra-axial fluid collection was not present previously and is consistent with a subacute to early chronic subdural hematoma.  There are no right-sided collections.  There is no evidence for acute stroke or parenchymal hemorrhage. There is no hydrocephalus.  Mild atrophy is noted.  There may be an old lacunar infarct involving the right basal ganglia.  The calvarium is intact.  There are no acute sinus fluid collections.  IMPRESSION: Roughly 2 cm thick subacute to early  chronic left subdural hematoma.  This was not present on previous exam.  Critical Value/emergent results were called by telephone at the time of interpretation on 12/06/2011  at 11:39 a.m.  to  Dr. Clarene Duke, who verbally acknowledged these results.  Original Report Authenticated By: Elsie Stain, M.D.    Other results: EKG:   Assessment & Plan by Problem: Principal Problem:  1. Syncope : concern for cardiac source given that patient was seated but given his EKG is without changes, recent ECHO without abnormalities it is more  likely neurocardiogenic (post-prandial hypotension) given his history that his episodes occur after eating thus diverting blood flow to the intestines & stomach. This is more common with dehydration and anemia. He reports that he tries to keep hydrated by drinking water with his pills and "a cup of orange juice and such" but could "do much better with drinking water".  Plan: -check orthostatic bp (although patient has already been given fluids in the ED) -monitor with telemetry overnight -advise to eat small meals and eat slowly, avoid alcohol and high carbohydrate meals -advise to avoid taking bp meds around meal time -check 12 lead EKG -cycle cardiac enzymes given elevated Ck and CK-Mb,  although troponin negative  2. Disposition: pt would likely benefit from assisted living placement but currently resistant to this suggestion, will address further with family who is very concerned given these recurrent syncopal episodes  Signed: Kristie Bright 12/06/2011, 2:17 PM

## 2011-12-06 NOTE — Progress Notes (Signed)
Chaplain Note: This is a follow up referral visit from [facility]  I visited with Pt.and his son who was at pt.s bedside.  Son indicated that further tests were being done and pt.was being admitted. Will follow as need.

## 2011-12-06 NOTE — Consult Note (Signed)
Referring Physician: Clarene Duke    Chief Complaint: Syncope   HPI: Matthew Bright is an 77 y.o. male who awakened normal today.  Was noted while at the breakfast table to have a syncopal event.  Patient reports that he did get warning with feeling of dizziness as if he was going to pass out.  EMS was called and patient was brought in as a code stroke. Has been wearing a cardiac monitor for the past month.  Got the report last week that no events were noted on the monitor.  Monitor was worn because of a syncopal episode that occurred about 2 months ago.   About two weeks ago fell over a drain pipe and hit his truck.  Was seen at an urgent care and multiple facial films were performed but no CT imaging was done.,  Patient reports that he has noticed some mild RUE weakness since that fall.   LSN: [id number] tPA Given: No: SDH on imaging  Past Medical History  Diagnosis Date  . Hyperlipidemia   . Enlarged prostate   . Prostate cancer   . Exertional dyspnea   . Syncope and collapse  2008 and again in October 2012  . Hypertension   . Cerebellar stroke, acute 2008  . S/P cardiac cath Jan. 2007    Cath at [facility] several years ago with no significant CAD noted.   . OA (osteoarthritis)   . Varicose veins     Past Surgical History  Procedure Date  . Prostate biopsy     X2  . Vein ligation and stripping   . Hip surgery   . Lumbar laminectomy   . Hemorrhoid surgery   . Cervical discectomy     ANTERIOR C5-C6  . Inguinal hernia repair   . Cardiovascular stress test 11/13/2007    EF 67%, NO ISCHEMIA     No family history on file. Social History:  reports that he has never smoked. He does not have any smokeless tobacco history on file. He reports that he does not drink alcohol or use illicit drugs.  Allergies:  Allergies  Allergen Reactions  . Avalide (Irbesartan-Hydrochlorothiazide)   . Darvocet (Propoxyphene N-Acetaminophen)   . Lovastatin   . Zocor (Simvastatin)     Medications: I have  reviewed the patient's current medications. Prior to Admission:  ASA, Coenzyme Q, Lisinopril, B-complex, Niacin   ROS: History obtained from the patient  General ROS: negative for - chills, fatigue, fever, night sweats, weight gain or weight loss Psychological ROS: negative for - behavioral disorder, hallucinations, memory difficulties, mood swings or suicidal ideation Ophthalmic ROS: negative for - blurry vision, double vision, eye pain or loss of vision ENT ROS: negative for - epistaxis, nasal discharge, oral lesions, sore throat, tinnitus or vertigo Allergy and Immunology ROS: negative for - hives or itchy/watery eyes Hematological and Lymphatic ROS: facial bruising Endocrine ROS: negative for - galactorrhea, hair pattern changes, polydipsia/polyuria or temperature intolerance Respiratory ROS: negative for - cough, hemoptysis, shortness of breath or wheezing Cardiovascular ROS: negative for - chest pain, dyspnea on exertion, edema or irregular heartbeat Gastrointestinal ROS: negative for - abdominal pain, diarrhea, hematemesis, nausea/vomiting or stool incontinence Genito-Urinary ROS: negative for - dysuria, hematuria, incontinence or urinary frequency/urgency Musculoskeletal ROS: joint pain due to osteoarthritis Neurological ROS: as noted in HPI Dermatological ROS: negative for rash and skin lesion changes  Physical Examination: Blood pressure 153/63, pulse 57, temperature 97.5 F (36.4 C), temperature source Oral, resp. rate 16, SpO2 100.00%.  Neurologic Examination:  Mental Status: Alert, oriented, thought content appropriate.  Speech fluent without evidence of aphasia.  Able to follow 3 step commands without difficulty. Cranial Nerves: II: visual fields grossly normal, pupils equal, round, reactive to light and accommodation III,IV, VI: ptosis not present, extra-ocular motions intact bilaterally V,VII: smile symmetric, facial light touch sensation normal bilaterally VIII:  hearing normal bilaterally IX,X: gag reflex present XI: trapezius strength/neck flexion strength normal bilaterally XII: tongue strength normal  Motor: Right : Upper extremity   5-/5 w/pronator drift   Left:     Upper extremity   5/5  Lower extremity   5/5       Lower extremity   5/5 Tone and bulk:normal tone throughout; no atrophy noted Sensory: Pinprick and light touch intact throughout, bilaterally Deep Tendon Reflexes: 2+ and symmetric in the upper extremities, absent in the lower extremities Plantars: Right: upgoing   Left: upgoing Cerebellar: normal finger-to-nose and normal heel-to-shin test   Results for orders placed during the hospital encounter of 12/06/11 (from the past 48 hour(s))  CK TOTAL AND CKMB     Status: Abnormal   Collection Time   12/06/11 11:26 AM      Component Value Range Comment   Total CK 402 (*) 7 - 232 (U/L)    CK, MB 11.2 (*) 0.3 - 4.0 (ng/mL)    Relative Index 2.8 (*) 0.0 - 2.5    TROPONIN I     Status: Normal   Collection Time   12/06/11 11:26 AM      Component Value Range Comment   Troponin I <0.30  <0.30 (ng/mL)   PROTIME-INR     Status: Normal   Collection Time   12/06/11 11:27 AM      Component Value Range Comment   Prothrombin Time 14.8  11.6 - 15.2 (seconds)    INR 1.14  0.00 - 1.49    APTT     Status: Normal   Collection Time   12/06/11 11:27 AM      Component Value Range Comment   aPTT 26  24 - 37 (seconds)   CBC     Status: Abnormal   Collection Time   12/06/11 11:27 AM      Component Value Range Comment   WBC 7.3  4.0 - 10.5 (K/uL)    RBC 4.06 (*) 4.22 - 5.81 (MIL/uL)    Hemoglobin 12.4 (*) 13.0 - 17.0 (g/dL)    HCT 45.4 (*) 09.8 - 52.0 (%)    MCV 88.9  78.0 - 100.0 (fL)    MCH 30.5  26.0 - 34.0 (pg)    MCHC 34.3  30.0 - 36.0 (g/dL)    RDW 11.9  14.7 - 82.9 (%)    Platelets 204  150 - 400 (K/uL)   DIFFERENTIAL     Status: Abnormal   Collection Time   12/06/11 11:27 AM      Component Value Range Comment   Neutrophils  Relative 65  43 - 77 (%)    Neutro Abs 4.7  1.7 - 7.7 (K/uL)    Lymphocytes Relative 19  12 - 46 (%)    Lymphs Abs 1.4  0.7 - 4.0 (K/uL)    Monocytes Relative 9  3 - 12 (%)    Monocytes Absolute 0.7  0.1 - 1.0 (K/uL)    Eosinophils Relative 6 (*) 0 - 5 (%)    Eosinophils Absolute 0.5  0.0 - 0.7 (K/uL)    Basophils Relative 1  0 - 1 (%)  Basophils Absolute 0.1  0.0 - 0.1 (K/uL)   COMPREHENSIVE METABOLIC PANEL     Status: Abnormal   Collection Time   12/06/11 11:27 AM      Component Value Range Comment   Sodium 132 (*) 135 - 145 (mEq/L)    Potassium 4.0  3.5 - 5.1 (mEq/L)    Chloride 98  96 - 112 (mEq/L)    CO2 28  19 - 32 (mEq/L)    Glucose, Bld 121 (*) 70 - 99 (mg/dL)    BUN 19  6 - 23 (mg/dL)    Creatinine, Ser 1.61  0.50 - 1.35 (mg/dL)    Calcium 9.0  8.4 - 10.5 (mg/dL)    Total Protein 6.6  6.0 - 8.3 (g/dL)    Albumin 3.5  3.5 - 5.2 (g/dL)    AST 38 (*) 0 - 37 (U/L)    ALT 29  0 - 53 (U/L)    Alkaline Phosphatase 64  39 - 117 (U/L)    Total Bilirubin 0.4  0.3 - 1.2 (mg/dL)    GFR calc non Af Amer 65 (*) >90 (mL/min)    GFR calc Af Amer 75 (*) >90 (mL/min)   POCT I-STAT, CHEM 8     Status: Abnormal   Collection Time   12/06/11 11:50 AM      Component Value Range Comment   Sodium 135  135 - 145 (mEq/L)    Potassium 4.1  3.5 - 5.1 (mEq/L)    Chloride 100  96 - 112 (mEq/L)    BUN 20  6 - 23 (mg/dL)    Creatinine, Ser 0.96  0.50 - 1.35 (mg/dL)    Glucose, Bld 045 (*) 70 - 99 (mg/dL)    Calcium, Ion 4.09  1.12 - 1.32 (mmol/L)    TCO2 27  0 - 100 (mmol/L)    Hemoglobin 12.9 (*) 13.0 - 17.0 (g/dL)    HCT 81.1 (*) 91.4 - 52.0 (%)    Ct Head Wo Contrast  12/06/2011  *RADIOLOGY REPORT*  Clinical Data: History of previous syncopal episodes.  Episode of unresponsiveness today.  CT HEAD WITHOUT CONTRAST  Technique:  Contiguous axial images were obtained from the base of the skull through the vertex without contrast.  Comparison: 10/19/2011  Findings: There is an iso- to  hypodense extracerebral fluid collection involving the left frontal and left parietal regions with maximal thickness of 19 mm (image 27). There is no midline shift.  This extra-axial fluid collection was not present previously and is consistent with a subacute to early chronic subdural hematoma.  There are no right-sided collections.  There is no evidence for acute stroke or parenchymal hemorrhage. There is no hydrocephalus.  Mild atrophy is noted.  There may be an old lacunar infarct involving the right basal ganglia.  The calvarium is intact.  There are no acute sinus fluid collections.  IMPRESSION: Roughly 2 cm thick subacute to early chronic left subdural hematoma.  This was not present on previous exam.  Critical Value/emergent results were called by telephone at the time of interpretation on 12/06/2011  at 11:39 a.m.  to  Dr. Clarene Duke, who verbally acknowledged these results.  Original Report Authenticated By: Elsie Stain, M.D.    Assessment: 77 y.o. male admitted after a syncopal event.  Exam only significant for mild RUE weakness.  CT shows a left SDH-based on history likely subacute and related to the fall about 2 weeks ago.  Patient therefore not a tPA candidate.  Still  concerned about cause of syncope though and further work up required.    Stroke Risk Factors - hyperlipidemia, hypertension and stroke in the past  Plan: 1. EEG 2. Patient with epsodes of vertigo, syncope and now with occipital SDH-would rule out vertebrobasilar pathology.  CTA of the head and neck. 3. Repeat head CT to follow progression of hemorrhage   Thana Farr, MD [facility] [phone] 12/06/2011, 12:28 PM

## 2011-12-06 NOTE — ED Notes (Signed)
Admitting provider at bedside.

## 2011-12-06 NOTE — ED Notes (Signed)
[id number] ready

## 2011-12-06 NOTE — Progress Notes (Signed)
Pt was admitted to the ED on 12/06/11 after having a fall due to a syncopal episode at breakfast.  Pt is from home with his wife and daughter.  Pt is alert and oriented x3, and is ambulatory with 1 assist.  Skin is intact except for a bruise and abrasion to the right cheek from a previous fall.  Pt is on telemetry showing atrial fibrillation.  All other systems are unremarkable.  Plan of care includes hydration with IV fluids, neuro checks, and lab work.  Will continue to treat per MD orders.

## 2011-12-06 NOTE — ED Notes (Signed)
Per EMS- Pt comes from home where he was eating breakfast, pt had syncopal episode, became unresponsive. Initial bystander noted left sided facial droop. Last seen normal 1015. BP 118 systolic, sinus bradycardia. Pt has hx of wear holter monitor, reporting no arrythmias noted. Pt has scratch on face where he reports fell in his truck several weeks ago. At this time pt alert and oriented. Stroke team at bedside. Vitals stable

## 2011-12-06 NOTE — Code Documentation (Signed)
77 yo male who was sitting with his wife at the kitchen table when he suddenly felt light headed and passed out; pt was aware of events preceding LOC.  This occurred at 1015. EMS was called and activated code stroke at 1100. Pt arrived at 1116. Stroke team here PTA at 1111.  CT showed SDH; pt reports fall (tripped outside) about 2 weeks ago resulting in visit to Urgent Care, dermabond of laceration R check and obvious bruising. Code stroke canceled at 1145 per Dr. Thad Ranger. Pt is now alert and able to give all the above information.He reports wearing halter monitor for 1 month which showed no arrhythmias. Great historian.

## 2011-12-06 NOTE — ED Notes (Signed)
Report received from Angelene Giovanni, RN on patient. Initial assessment performed by this RN and patient placed on cardiac monitor. VSS. No changes from previous assessment. See detailed assessment below. Patient currently waiting for inpatient bed. Family at bedside and aware of plan, and expected delay.

## 2011-12-06 NOTE — Progress Notes (Addendum)
Pt is having high BP and is running A. Fib on tele.  Not sure if pt runs A. Fib at baseline.  Paged the first contact # for the after 5pm MD twice, but got no response.     Page not received on [phone]) and second after hours number not paged ([phone]).  Will contact RN. - K. Arvilla Market, DO

## 2011-12-06 NOTE — ED Notes (Signed)
Stroke team at bedside. Pt on monitor. Responding appropriately

## 2011-12-07 ENCOUNTER — Other Ambulatory Visit: Payer: Self-pay

## 2011-12-07 ENCOUNTER — Inpatient Hospital Stay (HOSPITAL_COMMUNITY): Payer: Medicare Other

## 2011-12-07 LAB — CBC
HCT: 36.3 % — ABNORMAL LOW (ref 39.0–52.0)
Hemoglobin: 12.2 g/dL — ABNORMAL LOW (ref 13.0–17.0)
MCH: 29.7 pg (ref 26.0–34.0)
MCV: 88.3 fL (ref 78.0–100.0)
RBC: 4.11 MIL/uL — ABNORMAL LOW (ref 4.22–5.81)
WBC: 7.6 10*3/uL (ref 4.0–10.5)

## 2011-12-07 LAB — CARDIAC PANEL(CRET KIN+CKTOT+MB+TROPI)
CK, MB: 7.5 ng/mL (ref 0.3–4.0)
Relative Index: 2.6 — ABNORMAL HIGH (ref 0.0–2.5)
Relative Index: 2.8 — ABNORMAL HIGH (ref 0.0–2.5)
Total CK: 294 U/L — ABNORMAL HIGH (ref 7–232)
Troponin I: <0.3 ng/mL (ref <0.30)
Troponin I: <0.3 ng/mL (ref <0.30)

## 2011-12-07 LAB — GLUCOSE, CAPILLARY: Glucose-Capillary: 93 mg/dL (ref 70–99)

## 2011-12-07 LAB — COMPREHENSIVE METABOLIC PANEL
AST: 29 U/L (ref 0–37)
Albumin: 3.1 g/dL — ABNORMAL LOW (ref 3.5–5.2)
BUN: 14 mg/dL (ref 6–23)
Calcium: 8.4 mg/dL (ref 8.4–10.5)
Chloride: 102 meq/L (ref 96–112)
Creatinine, Ser: 0.83 mg/dL (ref 0.50–1.35)
Total Protein: 6 g/dL (ref 6.0–8.3)

## 2011-12-07 MED ORDER — LISINOPRIL 5 MG PO TABS: 10.0000 mg | ORAL_TABLET | Freq: Every day | ORAL | Status: DC

## 2011-12-07 MED ORDER — IOHEXOL 350 MG/ML SOLN
50.0000 mL | Freq: Once | INTRAVENOUS | Status: AC | PRN
  Administered 2011-12-07: 50 mL via INTRAVENOUS

## 2011-12-07 MED ORDER — LISINOPRIL 10 MG PO TABS
10.0000 mg | ORAL_TABLET | Freq: Every day | ORAL | Status: DC
  Administered 2011-12-07 – 2011-12-08 (×2): 10 mg via ORAL
  Filled 2011-12-07 (×2): qty 1

## 2011-12-07 NOTE — Progress Notes (Signed)
Subjective: No complaints per patient overnight and on exam, no red alarms on telemetry, 1st degree AV block noted briefly which resolved, occasional PVCs, pt aymptomatic  Objective: Vital signs in last 24 hours: Filed Vitals:   12/07/11 0504 12/07/11 1224 12/07/11 1225 12/07/11 1226  BP: 160/75 178/85 163/79 161/79  Pulse: 69 82 74 79  Temp: 98 F (36.7 C)     TempSrc: Oral     Resp: 18     Height:      Weight:      SpO2: 96%      Weight change:   Intake/Output Summary (Last 24 hours) at 12/07/11 1630 Last data filed at 12/07/11 1405  Gross per 24 hour  Intake   1400 ml  Output   1700 ml  Net   -300 ml   Physical Exam: General: Well-developed, well-nourished, elderly white male lying in bed, no acute distress;  Head: multiple ecchymoses to tight cheek, zygomatic arch and right peri-orbital area. Lungs: Normal respiratory effort. Clear to auscultation BL without crackles or wheezes anteriorly. Heart: RRR. S1 and S2 normal without gallop, murmur, or rubs appreciated Abdomen: BS normoactive. Soft, Nondistended, non-tender. No masses or organomegaly. Neurologic: alert and oriented x3, appropriate and cooperative throughout examination  Lab Results: Basic Metabolic Panel:  Lab 12/07/11 1610 12/06/11 1150 12/06/11 1127  NA 134* 135 --  K 3.8 4.1 --  CL 102 100 --  CO2 25 -- 28  GLUCOSE 84 116* --  BUN 14 20 --  CREATININE 0.83 1.10 --  CALCIUM 8.4 -- 9.0  MG -- -- --  PHOS -- -- --   Liver Function Tests:  Lab 12/07/11 0521 12/06/11 1127  AST 29 38*  ALT 24 29  ALKPHOS 58 64  BILITOT 0.4 0.4  PROT 6.0 6.6  ALBUMIN 3.1* 3.5    CBC:  Lab 12/07/11 0521 12/06/11 1150 12/06/11 1127  WBC 7.6 -- 7.3  NEUTROABS -- -- 4.7  HGB 12.2* 12.9* --  HCT 36.3* 38.0* --  MCV 88.3 -- 88.9  PLT 221 -- 204   Cardiac Enzymes:  Lab 12/07/11 0913 12/07/11 0028 12/06/11 1600  CKTOTAL 236* 294* 367*  CKMB 6.5* 7.5* 10.3*  CKMBINDEX -- -- --  TROPONINI <0.30 <0.30 <0.30    CBG:  Lab 12/07/11 0755  GLUCAP 93    Coagulation:  Lab 12/06/11 1127  LABPROT 14.8  INR 1.14    Studies/Results: Ct Head Wo Contrast  12/06/2011  *RADIOLOGY REPORT*  Clinical Data: History of previous syncopal episodes.  Episode of unresponsiveness today.  CT HEAD WITHOUT CONTRAST  Technique:  Contiguous axial images were obtained from the base of the skull through the vertex without contrast.  Comparison: 10/19/2011  Findings: There is an iso- to hypodense extracerebral fluid collection involving the left frontal and left parietal regions with maximal thickness of 19 mm (image 27). There is no midline shift.  This extra-axial fluid collection was not present previously and is consistent with a subacute to early chronic subdural hematoma.  There are no right-sided collections.  There is no evidence for acute stroke or parenchymal hemorrhage. There is no hydrocephalus.  Mild atrophy is noted.  There may be an old lacunar infarct involving the right basal ganglia.  The calvarium is intact.  There are no acute sinus fluid collections.  IMPRESSION: Roughly 2 cm thick subacute to early chronic left subdural hematoma.  This was not present on previous exam.  Critical Value/emergent results were called by telephone at the  time of interpretation on 12/06/2011  at 11:39 a.m.  to  Dr. Clarene Duke, who verbally acknowledged these results.  Original Report Authenticated By: Elsie Stain, M.D.   Medications: I have reviewed the patient's current medications. Scheduled Meds:   . lisinopril  10 mg Oral Daily   Continuous Infusions:   . sodium chloride 100 mL/hr at 12/07/11 1203   PRN Meds:.alum & mag hydroxide-simeth, docusate sodium, senna, zolpidem  Assessment/Plan: 77 yo with h/o multiple episodes of syncope and prior negative 30 day event monitor and prior negative CT of head admitted for syncopal episode with subacute/chronic subdural hematoma on CT of head 1. Syncope : recent ECHO 10/2011  without abnormalities to explain syncope,  likely vasovagal/neurocardiogenic (post-prandial hypotension) given his history that his episodes occur after eating.  Pt noted today that his first syncopal episode occurred after eating a great deal at a celebration.  Son noted that nearly all episodes occurred in the kitchen with the exception of the syncopal event that occurred post blood donation.  Patient not orthostatic hypotensive during this admission but had been hydrated with maintenance IV fluids. CK-Mb and Total CK continuing to trend downward, troponin remains negative.  Plan:   -CTA of head and neck per Neurology pending -continue to monitor with telemetry -advise to eat small meals and eat slowly, avoid alcohol and high carbohydrate meals  -advise to avoid taking bp meds around meal time  -cycle cardiac enzymes given elevated Ck and CK-Mb, although troponin negative  -f/u with Dr. Patty Sermons, consider tilt table and/or EEG as outpatient  2. Disposition: pt desires to return to his home upon discharge   LOS: 1 day   Korine Winton 12/07/2011, 4:30 PM

## 2011-12-07 NOTE — Progress Notes (Signed)
Internal Medicine Teaching Service Attending Note Date: 12/07/2011  Patient name: Matthew Bright  Medical record number: [id number]  Date of birth: 1926-05-20    This patient has been seen and discussed with the house staff. Please see their note for complete details. I concur with their findings:  Patient to get neuro work up today.  If no etiology identified, will d/c home.    Phillips Goulette 12/07/2011, 2:16 PM

## 2011-12-07 NOTE — Progress Notes (Signed)
[facility]ESS NOTE    SUBJECTIVE   Patient has no complaints today.    OBJECTIVE   Vital signs in last 24 hours: Temp:  [97.5 F (36.4 C)-98.4 F (36.9 C)] 98 F (36.7 C) (12/18 0504) Pulse Rate:  [57-77] 69  (12/18 0504) Resp:  [16-19] 18  (12/18 0504) BP: (139-172)/(63-84) 160/75 mmHg (12/18 0504) SpO2:  [95 %-100 %] 96 % (12/18 0504) Weight:  [72.7 kg (160 lb 4.4 oz)] 160 lb 4.4 oz (72.7 kg) (12/17 1540)  Intake/Output from previous day: 12/17 0701 - 12/18 0700 In: 800 [I.V.:800] Out: 1050 [Urine:1050] Intake/Output this shift: Total I/O In: 240 [P.O.:240] Out: 350 [Urine:350] Nutritional status: Cardiac  Past Medical History  Diagnosis Date  . Hyperlipidemia   . Enlarged prostate   . Prostate cancer   . Exertional dyspnea   . Syncope and collapse  2008 and again in October 2012  . Hypertension   . Cerebellar stroke, acute 2008  . S/P cardiac cath Jan. 2007    Cath at [facility] several years ago with no significant CAD noted.   . OA (osteoarthritis)   . Varicose veins     Neurologic Exam:   Mental Status: Alert, oriented, thought content appropriate.  Speech fluent without evidence of aphasia. Able to follow 3 step commands without difficulty. Cranial Nerves: II-Visual fields grossly intact. III/IV/VI-Extraocular movements intact.  Pupils reactive bilaterally. V/VII-Smile symmetric VIII-grossly intact IX/X-normal gag XI-bilateral shoulder shrug XII-midline tongue extension Motor: 5/5 bilaterally with normal tone and bulk except on the right proximal strength of shoulder.  I feel a 5-/5 strength.  Today I did not see any drift.  Sensory: Pinprick and light touch intact throughout, bilaterally Deep Tendon Reflexes: 2+ and symmetric throughout Plantars: upgoing bilaterally Cerebellar: Normal finger-to-nose, normal rapid alternating movements and normal heel-to-shin test.     Lab Results: Lab Results    Component Value Date/Time   CHOL 171 11/30/2011 11:11 AM   Lipid Panel No results found for this basename: CHOL,TRIG,HDL,CHOLHDL,VLDL,LDLCALC in the last 72 hours  Studies/Results: Ct Head Wo Contrast  12/06/2011  *RADIOLOGY REPORT*  Clinical Data: History of previous syncopal episodes.  Episode of unresponsiveness today.  CT HEAD WITHOUT CONTRAST  Technique:  Contiguous axial images were obtained from the base of the skull through the vertex without contrast.  Comparison: 10/19/2011  Findings: There is an iso- to hypodense extracerebral fluid collection involving the left frontal and left parietal regions with maximal thickness of 19 mm (image 27). There is no midline shift.  This extra-axial fluid collection was not present previously and is consistent with a subacute to early chronic subdural hematoma.  There are no right-sided collections.  There is no evidence for acute stroke or parenchymal hemorrhage. There is no hydrocephalus.  Mild atrophy is noted.  There may be an old lacunar infarct involving the right basal ganglia.  The calvarium is intact.  There are no acute sinus fluid collections.  IMPRESSION: Roughly 2 cm thick subacute to early chronic left subdural hematoma.  This was not present on previous exam.  Critical Value/emergent results were called by telephone at the time of interpretation on 12/06/2011  at 11:39 a.m.  to  Dr. Clarene Duke, who verbally acknowledged these results.  Original Report Authenticated By: Elsie Stain,  M.D.    Medications:    Scheduled:  Currently being addressed by resident  Assessment/Plan:    77 YO male who was initially admitted for syncopal event.  On exam he continues to have slight right UE weakness.  No events while in the hospital.    Recommend: (1) as in prior note we recommend a EEG (2) CTA head and neck to evaluate for VBI in addition to evaluate for progression of hemorrhage. (3) orthostatic blood pressure and PT to evaluate  Matthew Morn  PA-C [facility] [phone]  12/07/2011, 10:06 AM

## 2011-12-07 NOTE — H&P (Addendum)
Internal Medicine Teaching Service Attending Note Date: 12/07/2011  Patient name: Matthew Bright  Medical record number: [id number]  Date of birth: 1926-04-26   I have seen and evaluated Matthew Bright and discussed their care with the Residency Team.  77 yo with history of syncopal episodes, vertigo and mechanical fall here with syncopal episode.  Has been seen by cardiology and worked up with no etiology identified.  He does note LOC.  It appears to occur around the time of eating.  Recent mechanical fall with subdural hematoma.    Physical Exam: Blood pressure 161/79, pulse 79, temperature 98 F (36.7 C), temperature source Oral, resp. rate 18, height 5\' 8"  (1.727 m), weight 160 lb 4.4 oz (72.7 kg), SpO2 96.00%. BP 161/79  Pulse 79  Temp(Src) 98 F (36.7 C) (Oral)  Resp 18  Ht 5\' 8"  (1.727 m)  Wt 160 lb 4.4 oz (72.7 kg)  BMI 24.37 kg/m2  SpO2 96% General appearance: alert, cooperative and no distress Lungs: clear to auscultation bilaterally Heart: regular rate and rhythm, S1, S2 normal, no murmur, click, rub or gallop  Lab results: Results for orders placed during the hospital encounter of 12/06/11 (from the past 24 hour(s))  CARDIAC PANEL(CRET KIN+CKTOT+MB+TROPI)     Status: Abnormal   Collection Time   12/06/11  4:00 PM      Component Value Range   Total CK 367 (*) 7 - 232 (U/L)   CK, MB 10.3 (*) 0.3 - 4.0 (ng/mL)   Troponin I <0.30  <0.30 (ng/mL)   Relative Index 2.8 (*) 0.0 - 2.5   CARDIAC PANEL(CRET KIN+CKTOT+MB+TROPI)     Status: Abnormal   Collection Time   12/07/11 12:28 AM      Component Value Range   Total CK 294 (*) 7 - 232 (U/L)   CK, MB 7.5 (*) 0.3 - 4.0 (ng/mL)   Troponin I <0.30  <0.30 (ng/mL)   Relative Index 2.6 (*) 0.0 - 2.5   COMPREHENSIVE METABOLIC PANEL     Status: Abnormal   Collection Time   12/07/11  5:21 AM      Component Value Range   Sodium 134 (*) 135 - 145 (mEq/L)   Potassium 3.8  3.5 - 5.1 (mEq/L)   Chloride 102  96 - 112 (mEq/L)   CO2  25  19 - 32 (mEq/L)   Glucose, Bld 84  70 - 99 (mg/dL)   BUN 14  6 - 23 (mg/dL)   Creatinine, Ser 4.09  0.50 - 1.35 (mg/dL)   Calcium 8.4  8.4 - 81.1 (mg/dL)   Total Protein 6.0  6.0 - 8.3 (g/dL)   Albumin 3.1 (*) 3.5 - 5.2 (g/dL)   AST 29  0 - 37 (U/L)   ALT 24  0 - 53 (U/L)   Alkaline Phosphatase 58  39 - 117 (U/L)   Total Bilirubin 0.4  0.3 - 1.2 (mg/dL)   GFR calc non Af Amer 79 (*) >90 (mL/min)   GFR calc Af Amer >90  >90 (mL/min)  CBC     Status: Abnormal   Collection Time   12/07/11  5:21 AM      Component Value Range   WBC 7.6  4.0 - 10.5 (K/uL)   RBC 4.11 (*) 4.22 - 5.81 (MIL/uL)   Hemoglobin 12.2 (*) 13.0 - 17.0 (g/dL)   HCT 91.4 (*) 78.2 - 52.0 (%)   MCV 88.3  78.0 - 100.0 (fL)   MCH 29.7  26.0 - 34.0 (pg)  MCHC 33.6  30.0 - 36.0 (g/dL)   RDW 45.4  09.8 - 11.9 (%)   Platelets 221  150 - 400 (K/uL)  GLUCOSE, CAPILLARY     Status: Normal   Collection Time   12/07/11  7:55 AM      Component Value Range   Glucose-Capillary 93  70 - 99 (mg/dL)  CARDIAC PANEL(CRET KIN+CKTOT+MB+TROPI)     Status: Abnormal   Collection Time   12/07/11  9:13 AM      Component Value Range   Total CK 236 (*) 7 - 232 (U/L)   CK, MB 6.5 (*) 0.3 - 4.0 (ng/mL)   Troponin I <0.30  <0.30 (ng/mL)   Relative Index 2.8 (*) 0.0 - 2.5     Imaging results:  Ct Head Wo Contrast  12/06/2011  *RADIOLOGY REPORT*  Clinical Data: History of previous syncopal episodes.  Episode of unresponsiveness today.  CT HEAD WITHOUT CONTRAST  Technique:  Contiguous axial images were obtained from the base of the skull through the vertex without contrast.  Comparison: 10/19/2011  Findings: There is an iso- to hypodense extracerebral fluid collection involving the left frontal and left parietal regions with maximal thickness of 19 mm (image 27). There is no midline shift.  This extra-axial fluid collection was not present previously and is consistent with a subacute to early chronic subdural hematoma.  There are no  right-sided collections.  There is no evidence for acute stroke or parenchymal hemorrhage. There is no hydrocephalus.  Mild atrophy is noted.  There may be an old lacunar infarct involving the right basal ganglia.  The calvarium is intact.  There are no acute sinus fluid collections.  IMPRESSION: Roughly 2 cm thick subacute to early chronic left subdural hematoma.  This was not present on previous exam.  Critical Value/emergent results were called by telephone at the time of interpretation on 12/06/2011  at 11:39 a.m.  to  Dr. Clarene Duke, who verbally acknowledged these results.  Original Report Authenticated By: Elsie Stain, M.D.    Assessment and Plan: I agree with the formulated Assessment and Plan with the following changes: patient with syncope, likely vasovagal since associated with food.  No signs of dehydration.  Neuro also has seen patient and considering vertebrobasilar issue and patient to get CTA and EEG.  If not significant, patient will be instructed to eat smaller meals and keep hydrated.

## 2011-12-08 ENCOUNTER — Encounter (HOSPITAL_COMMUNITY): Payer: Self-pay | Admitting: *Deleted

## 2011-12-08 DIAGNOSIS — R55 Syncope and collapse: Secondary | ICD-10-CM

## 2011-12-08 LAB — GLUCOSE, CAPILLARY

## 2011-12-08 NOTE — Discharge Summary (Signed)
Internal Medicine Teaching [facility] Discharge Note  Name: Matthew Bright MRN: [id number] DOB: March 04, 1926 77 y.o.  Date of Admission: 12/06/2011 11:20 AM Date of Discharge: 12/08/2011 Attending Physician: Staci Righter, MD  Discharge Diagnosis: Principal Problem:  *Syncope, vasovagal Active Problems:  HTN (hypertension)   Discharge Medications: Discharge Medication List as of 12/08/2011 10:44 AM    CONTINUE these medications which have CHANGED   Details  lisinopril (PRINIVIL,ZESTRIL) 5 MG tablet Take 2 tablets (10 mg total) by mouth at bedtime., Starting 12/07/2011, Until Discontinued, Normal      CONTINUE these medications which have NOT CHANGED   Details  aspirin 81 MG tablet Take 81 mg by mouth daily.  , Until Discontinued, Historical Med    B Complex-Biotin-FA (B COMPLEX 100 TR) TBCR Take by mouth daily.  , Until Discontinued, Historical Med    Coenzyme Q10 (COQ10 PO) Take by mouth daily.  , Until Discontinued, Historical Med    Misc Natural Products (ADVANCED JOINT RELIEF PO) Take 2 tablets by mouth 2 (two) times daily. , Until Discontinued, Historical Med    niacin 500 MG tablet Take 250 mg by mouth daily with breakfast. , Until Discontinued, Historical Med    NON FORMULARY 1 tablet 2 (two) times daily. , Until Discontinued, Historical Med    Probiotic Product (PRO-BIOTIC BLEND) CAPS Take 1 capsule by mouth daily with breakfast.  , Until Discontinued, Historical Med        Disposition and follow-up:   Mr.Matthew Bright was discharged from [facility] in Stable condition.    Follow-up Appointments: Follow-up Information    Follow up with FISHER,DONALD EUGENE in 5 days. [facility] 9:00am)       Follow up with Cassell Clement, MD. (12/24/11 Cardiology 11:45am)    Contact information:   [address], [address] [place] [address] [phone]         Discharge Orders    Future Appointments: Provider:  Department: Dept Phone: Center:   12/24/2011 11:45 AM Cassell Clement, MD [facility] [phone] None     Future Orders Please Complete By Expires   Increase activity slowly      Discharge instructions      Comments:   Follow-up with Dr. Patty Sermons and Dr. Sherrie Mustache.      Consultations: Neurology, Neurosurgery    Procedures Performed:  Ct Angio Head W/cm &/or Wo Cm  12/08/2011  *RADIOLOGY REPORT*  Clinical Data:  Vertebral basilar insufficiency.  CT ANGIOGRAPHY HEAD AND NECK  Technique:  Multidetector CT imaging of the head and neck was performed using the standard protocol during bolus administration of intravenous contrast.  Multiplanar CT image reconstructions including MIPs were obtained to evaluate the vascular anatomy. Carotid stenosis measurements (when applicable) are obtained utilizing NASCET criteria, using the distal internal carotid diameter as the denominator.  Contrast:  50 ml Omnipaque-350.  Comparison:  12/06/2011 head CT.  CTA NECK  Findings:  Common origin of the innominate artery and left common carotid artery.  Mild calcification and narrowing of the proximal left subclavian artery.  Plaque at the origin of the dominant left vertebral artery with mild narrowing.  Mild kink and slight narrowing proximal right subclavian artery.  Calcified plaque with mild to moderate narrowing of the proximal aspect of the nondominant right vertebral artery.  Right vertebral artery is narrowed at the C2 level and occluded at the C1 - occipital level.  Kink with mild narrowing proximal to mid right common carotid artery.  No significant stenosis of the proximal right internal carotid artery at the bulb level.  Just beyond this region there is a mild kink and mild narrowing.  Plaque at the proximal left internal carotid artery with less than 20% diameter stenosis.  Mild ectasia of the vertical segment of the left internal carotid artery.  Prominent cervical spondylotic changes.  Right thyroid 2  cm lesion.  This can be assessed with ultrasound.  Scattered normal sized to prominent sized lymph nodes largest in the right level II region measures 1.4 x 1.4 x 1.8 cm.  Prominent calcification at the right submandibular gland level with diminutive size right side new line.   Review of the MIP images confirms the above findings.  IMPRESSION: Plaque at the origin of the dominant left vertebral artery with mild narrowing.  Calcified plaque with mild to moderate narrowing of the proximal aspect of the nondominant right vertebral artery.  Right vertebral artery is narrowed at the C2 level and occluded at the C1 - occipital level.  No hemodynamically significant stenosis involving either carotid bifurcation.  Prominent cervical spondylotic changes.  Right thyroid 2 cm lesion.  This can be assessed with ultrasound.  Scattered normal sized to prominent sized lymph nodes largest in the right level II region measures 1.4 x 1.4 x 1.8 cm.  Prominent calcification at the right submandibular gland level with diminutive size right side new line.  CTA HEAD  Findings:  No CT evidence of large acute infarct.  Small acute infarct cannot be excluded by CT.  Small vessel disease type changes.  Global atrophy without hydrocephalus.  No intracranial mass or abnormal enhancement.  Prominent panus formation and with mild impression upon the ventral aspect of the cervical medullary junction.  Occlusion of the right vertebral artery at the C1 - occipital level.  Right PICA is small although patent.  Distal right vertebral artery is patent.  Ectatic left vertebral artery with mild narrowing distally.  Irregularity with mild narrowing of the basilar artery.  Calcification with mild narrowing irregularity of the cavernous segment of the internal carotid arteries bilaterally.  Mild branch vessel irregularity.   Review of the MIP images confirms the above findings.  IMPRESSION: Occlusion of the right vertebral artery at the C1 - occipital  level.  Right PICA is small although patent.  Distal right vertebral artery is patent.  Ectatic left vertebral artery with mild narrowing distally.  Irregularity with mild narrowing of the basilar artery.  Calcification with mild narrowing irregularity of the cavernous segment of the internal carotid arteries bilaterally.  Mild branch vessel irregularity.  Complex left sided 1.9 cm of subdural hematoma and local mass effect appears relatively similar to the recent head CT.  No new intracranial hemorrhage detected.  Remote right cerebellar infarct.  The present examination was read preliminarily by Dr. Benard Rink at the time imaging.  On follow-up final report 12/08/2011 8:35 a.m.  Original Report Authenticated By: Fuller Canada, M.D.   Ct Head Wo Contrast  12/06/2011  *RADIOLOGY REPORT*  Clinical Data: History of previous syncopal episodes.  Episode of unresponsiveness today.  CT HEAD WITHOUT CONTRAST  Technique:  Contiguous axial images were obtained from the base of the skull through the vertex without contrast.  Comparison: 10/19/2011  Findings: There is an iso- to hypodense extracerebral fluid collection involving the left frontal and left parietal regions with maximal thickness of 19 mm (image 27). There is no midline shift.  This extra-axial fluid collection was not present previously and is  consistent with a subacute to early chronic subdural hematoma.  There are no right-sided collections.  There is no evidence for acute stroke or parenchymal hemorrhage. There is no hydrocephalus.  Mild atrophy is noted.  There may be an old lacunar infarct involving the right basal ganglia.  The calvarium is intact.  There are no acute sinus fluid collections.  IMPRESSION: Roughly 2 cm thick subacute to early chronic left subdural hematoma.  This was not present on previous exam.  Critical Value/emergent results were called by telephone at the time of interpretation on 12/06/2011  at 11:39 a.m.  to  Dr. Clarene Duke, who  verbally acknowledged these results.  Original Report Authenticated By: Elsie Stain, M.D.   Ct Angio Neck W/cm &/or Wo/cm  12/08/2011  *RADIOLOGY REPORT*  Clinical Data:  Vertebral basilar insufficiency.  CT ANGIOGRAPHY HEAD AND NECK  Technique:  Multidetector CT imaging of the head and neck was performed using the standard protocol during bolus administration of intravenous contrast.  Multiplanar CT image reconstructions including MIPs were obtained to evaluate the vascular anatomy. Carotid stenosis measurements (when applicable) are obtained utilizing NASCET criteria, using the distal internal carotid diameter as the denominator.  Contrast:  50 ml Omnipaque-350.  Comparison:  12/06/2011 head CT.  CTA NECK  Findings:  Common origin of the innominate artery and left common carotid artery.  Mild calcification and narrowing of the proximal left subclavian artery.  Plaque at the origin of the dominant left vertebral artery with mild narrowing.  Mild kink and slight narrowing proximal right subclavian artery.  Calcified plaque with mild to moderate narrowing of the proximal aspect of the nondominant right vertebral artery.  Right vertebral artery is narrowed at the C2 level and occluded at the C1 - occipital level.  Kink with mild narrowing proximal to mid right common carotid artery.  No significant stenosis of the proximal right internal carotid artery at the bulb level.  Just beyond this region there is a mild kink and mild narrowing.  Plaque at the proximal left internal carotid artery with less than 20% diameter stenosis.  Mild ectasia of the vertical segment of the left internal carotid artery.  Prominent cervical spondylotic changes.  Right thyroid 2 cm lesion.  This can be assessed with ultrasound.  Scattered normal sized to prominent sized lymph nodes largest in the right level II region measures 1.4 x 1.4 x 1.8 cm.  Prominent calcification at the right submandibular gland level with diminutive size  right side new line.   Review of the MIP images confirms the above findings.  IMPRESSION: Plaque at the origin of the dominant left vertebral artery with mild narrowing.  Calcified plaque with mild to moderate narrowing of the proximal aspect of the nondominant right vertebral artery.  Right vertebral artery is narrowed at the C2 level and occluded at the C1 - occipital level.  No hemodynamically significant stenosis involving either carotid bifurcation.  Prominent cervical spondylotic changes.  Right thyroid 2 cm lesion.  This can be assessed with ultrasound.  Scattered normal sized to prominent sized lymph nodes largest in the right level II region measures 1.4 x 1.4 x 1.8 cm.  Prominent calcification at the right submandibular gland level with diminutive size right side new line.  CTA HEAD  Findings:  No CT evidence of large acute infarct.  Small acute infarct cannot be excluded by CT.  Small vessel disease type changes.  Global atrophy without hydrocephalus.  No intracranial mass or abnormal enhancement.  Prominent panus  formation and with mild impression upon the ventral aspect of the cervical medullary junction.  Occlusion of the right vertebral artery at the C1 - occipital level.  Right PICA is small although patent.  Distal right vertebral artery is patent.  Ectatic left vertebral artery with mild narrowing distally.  Irregularity with mild narrowing of the basilar artery.  Calcification with mild narrowing irregularity of the cavernous segment of the internal carotid arteries bilaterally.  Mild branch vessel irregularity.   Review of the MIP images confirms the above findings.  IMPRESSION: Occlusion of the right vertebral artery at the C1 - occipital level.  Right PICA is small although patent.  Distal right vertebral artery is patent.  Ectatic left vertebral artery with mild narrowing distally.  Irregularity with mild narrowing of the basilar artery.  Calcification with mild narrowing irregularity of the  cavernous segment of the internal carotid arteries bilaterally.  Mild branch vessel irregularity.  Complex left sided 1.9 cm of subdural hematoma and local mass effect appears relatively similar to the recent head CT.  No new intracranial hemorrhage detected.  Remote right cerebellar infarct.  The present examination was read preliminarily by Dr. Benard Rink at the time imaging.  On follow-up final report 12/08/2011 8:35 a.m.  Original Report Authenticated By: Fuller Canada, M.D.    Admission HPI: Mr. Fruth is a 77 yo male with PMHx significant for multiple syncopal episodes over the past 3 years and recent fall with resultant trauma to the right side of his face. He presents to the ED today via ambulance after an episode of syncope this morning. He reports that he was seated having breakfast with his wife and started to feel "dopey headed" then passed out while seated. The loss of consciousness was complete with rapid onset and short duration. He recovered spontaneously with feeling of right-sided weakness but reports that his right-side has been weak since since a fall 2 weeks ago. He denies fatigue, sleep or food deprivation, being over heated, alcohol consumption, pain or strong emotions prior to the episode. His last syncopal episode was in Oct 2012 whereby he sustained trauma to the head with need for sutures, although CT of head was negative. At that time he had EKG that was without abnormalities and was evaluated as an outpatient by his Cardiologist Dr. Patty Sermons. Further evaluation revealed a normal TTE and 30-day event monitor which did not show arrythmias. Of note he had recent fall 2 weeks ago after tripping over something with resultant trauma to right side of his face. He was evaluated at Urgent Care had films but no CT imagining of the head at that time. Today he was initially assessed as a code stroke, had CT of the head which demonstrated subacute/chronic left subdural hematoma.  Physical Exam:    Blood pressure 163/81, pulse 64, temperature 97.7 F (36.5 C), temperature source Oral, resp. rate 16, SpO2 100.00%. General: Well-developed, well-nourished, elderly white male lying on stretcher with family at bedside in no acute distress;  Head: Normocephalic, signs of prior trauma with multple ecchymoses to tight cheek, zygomatic arch and right peri-orbital area. Eyes/Ears: PERRL, EOMI, No signs of anemia or jaundice, hypertrichosis of external ear canals bilaterally Nose: Mucous membranes moist, not inflammed, nonerythematous. Throat: Oropharynx nonerythematous, no exudate appreciated.  Neck: No deformities, masses, or tenderness noted.Supple, No carotid Bruits, no JVD. Lungs: Normal respiratory effort. Clear to auscultation BL without crackles or wheezes anteriorly. Heart: RRR. S1 and S2 normal without gallop, murmur, or rubs appreciated Abdomen: BS  normoactive. Soft, Nondistended, non-tender. No masses or organomegaly. Extremities: trace pretibial edema with bilateral TED hose Neurologic: alert and oriented x3, appropriate and cooperative throughout examination. Strength 5/5 bilateral upper and lower extremities   Hospital Course by problem list: 1. Syncope : Mr. Cordner was initially evaluated for acute stroke and assessed by Neurology and Neurosurgery in the Emergency Department. Ct of Head was without  findings consistent with acute CVA but did show subacute/chronic subdural hematoma from prior head trauma fall ~2 -3 weeks prior.  In addition a recent ECHO 10/2011 and 30 day cardiac event monitor was without abnormalities to explain his syncope.  Neurology continued to evaluate Mr. Luffman during his admission and ordered CTA of Head and Neck which demonstrated the above findings which are unlikely to be etiology of his syncopal events. His syncopal episodes were deemed likely vasovagal/neurocardiogenic (post-prandial hypotension) given his history that all of his syncopal episodes occur  after eating with the exception of one which occurred after donating blood. Patient was not orthostatic hypotensive during this admission but had been hydrated with maintenance IV fluids. He was monitored with telemetry which demonstrated a brief 1st degree AV block on Day #2 and PVCs. His CK-Mb and Total CK were elevated on admission and thought to be secondary to prior trauma from fall.  They continued to trend downward during this admission with negative troponin throughout his stay.  Mr. Siddiqi was given literature on neurogenic hypotension/ vasovagal syncope and advised to eat small meals, eat slowly, avoid alcohol and high carbohydrate meals as well as refrain from taking his bp meds around meal time as before.  He will follow-up with [facility] Cardiologist Dr. Patty Sermons, consider tilt table and/or EEG as outpatient   2. Hypertension: Patient's blood pressure remained elevated throughout his admission with systolic blood pressure ranging 149-117 with pulse 63-82 on maintenance IV fluids. He was continued on his home regimen of lisinopril 5 mg by mouth twice a day. Prior to discharge his regimen was changed to 10 mg at bedtime in an effort to prevent further vasovagal hypotensive episodes.  3. Disposition: By time of discharge patient was satisfied with extent of his workup and diagnoses. He desired to return to his home whereby he lives with his wife upon discharge as opposed to an assisted living facility. If symptoms persist, his primary care physician Dr. Mila Merry of [facility] should consider tilt table evaluation and/or EEG as outpatient to rule out vestibular and/or seizure disorder   Discharge Vitals:  BP 159/71  Pulse 63  Temp(Src) 98.3 F (36.8 C) (Oral)  Resp 20  Ht 5\' 8"  (1.727 m)  Wt 160 lb 4.4 oz (72.7 kg)  BMI 24.37 kg/m2  SpO2 96%  Discharge Labs:  Results for orders placed during the hospital encounter of 12/06/11 (from the past 24 hour(s))  GLUCOSE,  CAPILLARY     Status: Normal   Collection Time   12/08/11  8:08 AM      Component Value Range   Glucose-Capillary 82  70 - 99 (mg/dL)   Comment 1 Documented in Chart     Comment 2 Notify RN      Signed: Kristie Cowman 12/08/2011, 3:26 PM

## 2011-12-08 NOTE — Progress Notes (Signed)
Physical Therapy Evaluation Patient Details Name: Matthew Bright MRN: [id number] DOB: 1926/02/21 Today's Date: 12/08/2011 1235-1300 EV2  Problem List:  Patient Active Problem List  Diagnoses  . Syncope  . HTN (hypertension)  . Abnormal EKG  . Hypercholesterolemia    Past Medical History:  Past Medical History  Diagnosis Date  . Hyperlipidemia   . Enlarged prostate   . Prostate cancer   . Exertional dyspnea   . Syncope and collapse  2008 and again in October 2012  . Hypertension   . Cerebellar stroke, acute 2008  . S/P cardiac cath Jan. 2007    Cath at [facility] several years ago with no significant CAD noted.   . OA (osteoarthritis)   . Varicose veins    Past Surgical History:  Past Surgical History  Procedure Date  . Prostate biopsy     X2  . Vein ligation and stripping   . Hip surgery   . Lumbar laminectomy   . Hemorrhoid surgery   . Cervical discectomy     ANTERIOR C5-C6  . Inguinal hernia repair   . Cardiovascular stress test 11/13/2007    EF 67%, NO ISCHEMIA     PT Assessment/Plan/Recommendation PT Assessment Clinical Impression Statement: Patient with syncope and recent h/o increased falls presents with decreased mobility due to recent hospitalization.  He denies need for HHPT, has already been discharged and son present to take patient home.  Has cane at home and instructed to use cane, wear shoes and conduct very gradual return to activity at home allowing son to assist with groceries and heavy chores in the home as wife utilizes walker int he home.  Feel he is minimally off baseline and he hopes to return to baseline gradually with activity at home.  Ecouraged him to contact his physician if weakness persists.. PT Recommendation/Assessment: Patent does not need any further PT services No Skilled PT: All education completed;Patient is supervision for all activity/mobility PT Recommendation Follow Up Recommendations: Home health PT (pt refuses HHPT  currently) Equipment Recommended: None recommended by PT PT Goals     PT Evaluation Precautions/Restrictions  Precautions Precautions: Fall Precaution Comments: h/o falling in Oct with syncope and laceration to right upper eye with 6 stitches, tripped about a week ago and injured area under right eye with laceration and bonding, then syncope with this visit (pt says diagnosed with neurogenic hypotension after meals. Required Braces or Orthoses:  (patient wears medical  compression knee hi's) Prior Functioning  Home Living Lives With: Spouse Type of Home: House Home Layout: Two level;Full bath on main level;Laundry or work area in basement;Able to live on main level with bedroom/bathroom Alternate Level Stairs-Rails: Can reach both;Left;Right Alternate Level Stairs-Number of Steps: 11 Home Access: Stairs to enter Entrance Stairs-Rails: Right Entrance Stairs-Number of Steps: 7 (side entrance), 9 (front entrance) Home Adaptive Equipment: Straight cane Additional Comments: only used cane outdoors (carried it mainly) per patient Prior Function Level of Independence: Independent with basic ADLs;Independent with homemaking with ambulation;Independent with transfers;Independent with gait Driving: Yes Cognition Cognition Arousal/Alertness: Awake/alert Overall Cognitive Status: Appears within functional limits for tasks assessed Orientation Level: Oriented X4 Sensation/Coordination   Extremity Assessment RLE Assessment RLE Assessment: Not tested LLE Assessment LLE Assessment: Not tested Mobility (including Balance) Bed Mobility Bed Mobility: Yes Supine to Sit: 6: Modified independent (Device/Increase time);HOB flat Transfers Transfers: Yes Sit to Stand: 6: Modified independent (Device/Increase time);From bed;With upper extremity assist Ambulation/Gait Ambulation/Gait: Yes Ambulation/Gait Assistance: 4: Min assist;5: Supervision Ambulation/Gait Assistance Details (indicate  cue  type and reason): initially min assist for safety, then progressed to supervision Assistive device: 1 person hand held assist;None;Other (Comment) (wall rail) Gait Pattern: Decreased stride length;Trunk flexed;Step-through pattern (right LE inwardly rotated with foot in pronated position) Stairs: Yes Stairs Assistance: 5: Supervision Stairs Assistance Details (indicate cue type and reason): negotiated with step through technique to ascend, step to sequence to descend, supervision to minguard just for safety Stair Management Technique: One rail Left;Alternating pattern;Step to pattern Number of Stairs: 7   Posture/Postural Control Posture/Postural Control: Postural limitations Postural Limitations: forward head, rounded shoulders, forward trunk flexion,  Balance Balance Assessed: Yes Static Standing Balance Static Standing - Balance Support: No upper extremity supported;Right upper extremity supported (pt intermittently touching b/s table) Static Standing - Level of Assistance: 6: Modified independent (Device/Increase time) Static Standing - Comment/# of Minutes: stood about 1.5-2 minutes before walking Exercise    End of Session PT - End of Session Equipment Utilized During Treatment: Gait belt Activity Tolerance: Patient tolerated treatment well Patient left: with family/visitor present (standing at bedside with son present) General Behavior During Session: [facility] for tasks performed Cognition: [facility] for tasks performed  [facility] 12/08/2011, 1:54 PM

## 2011-12-08 NOTE — Progress Notes (Signed)
[name] to be D/C'd Home per MD order.  Discussed with the patient and all questions fully answered.   [place], [place]  Home Medication Instructions ZOX:[id number]   Printed on:12/08/11 1218  Medication Information                    aspirin 81 MG tablet Take 81 mg by mouth daily.             Misc Natural Products (ADVANCED JOINT RELIEF PO) Take 2 tablets by mouth 2 (two) times daily.            B Complex-Biotin-FA (B COMPLEX 100 TR) TBCR Take by mouth daily.             Coenzyme Q10 (COQ10 PO) Take by mouth daily.             niacin 500 MG tablet Take 250 mg by mouth daily with breakfast.            NON FORMULARY 1 tablet 2 (two) times daily.            Probiotic Product (PRO-BIOTIC BLEND) CAPS Take 1 capsule by mouth daily with breakfast.             lisinopril (PRINIVIL,ZESTRIL) 5 MG tablet Take 2 tablets (10 mg total) by mouth at bedtime.             VVS,Abrasion to right cheek, otherwise, skin intact.  IV catheter discontinued intact. Site without signs and symptoms of complications. Dressing and pressure applied.  An After Visit Summary was printed and given to the patient. Patient escorted via WC, and D/C home via private auto.  Driggers, Rae Roam 12/08/2011 12:18 PM

## 2011-12-08 NOTE — Progress Notes (Signed)
[facility] PROGRESS NOTE    SUBJECTIVE   No complaints  OBJECTIVE   Vital signs in last 24 hours: Temp:  [98.3 F (36.8 C)] 98.3 F (36.8 C) (12/19 0545) Pulse Rate:  [63-82] 63  (12/19 0545) Resp:  [20] 20  (12/19 0545) BP: (149-178)/(71-85) 159/71 mmHg (12/19 0545) SpO2:  [95 %-96 %] 96 % (12/19 0545)  Intake/Output from previous day: 12/18 0701 - 12/19 0700 In: 2018.3 [P.O.:840; I.V.:1178.3] Out: 1252 [Urine:1251; Stool:1] Intake/Output this shift: Total I/O In: -  Out: 500 [Urine:500] Nutritional status: Cardiac  Past Medical History  Diagnosis Date  . Hyperlipidemia   . Enlarged prostate   . Prostate cancer   . Exertional dyspnea   . Syncope and collapse  2008 and again in October 2012  . Hypertension   . Cerebellar stroke, acute 2008  . S/P cardiac cath Jan. 2007    Cath at [facility] several years ago with no significant CAD noted.   . OA (osteoarthritis)   . Varicose veins     Neurologic Exam:   Mental Status:  Alert, oriented, thought content appropriate. Speech fluent without evidence of aphasia. Able to follow 3 step commands without difficulty.  Cranial Nerves:  II-Visual fields grossly intact.  III/IV/VI-Extraocular movements intact. Pupils reactive bilaterally.  V/VII-Smile symmetric  VIII-grossly intact  IX/X-normal gag  XI-bilateral shoulder shrug  XII-midline tongue extension  Motor: 5/5 bilaterally with normal tone and bulk except on the right proximal strength of shoulder. I feel a 5-/5 strength.  Sensory: Pinprick and light touch intact throughout, bilaterally  Deep Tendon Reflexes: 2+ and symmetric throughout  Plantars: upgoing bilaterally  Cerebellar: Normal finger-to-nose, normal rapid alternating movements and normal heel-to-shin test.     Lab Results: Lab Results  Component Value Date/Time   CHOL 171 11/30/2011 11:11 AM   Lipid Panel No results found for this basename:  CHOL,TRIG,HDL,CHOLHDL,VLDL,LDLCALC in the last 72 hours  Studies/Results: Ct Angio Head W/cm &/or Wo Cm  12/08/2011  *RADIOLOGY REPORT*  Clinical Data:  Vertebral basilar insufficiency.  CT ANGIOGRAPHY HEAD AND NECK  Technique:  Multidetector CT imaging of the head and neck was performed using the standard protocol during bolus administration of intravenous contrast.  Multiplanar CT image reconstructions including MIPs were obtained to evaluate the vascular anatomy. Carotid stenosis measurements (when applicable) are obtained utilizing NASCET criteria, using the distal internal carotid diameter as the denominator.  Contrast:  50 ml Omnipaque-350.  Comparison:  12/06/2011 head CT.  CTA NECK  Findings:  Common origin of the innominate artery and left common carotid artery.  Mild calcification and narrowing of the proximal left subclavian artery.  Plaque at the origin of the dominant left vertebral artery with mild narrowing.  Mild kink and slight narrowing proximal right subclavian artery.  Calcified plaque with mild to moderate narrowing of the proximal aspect of the nondominant right vertebral artery.  Right vertebral artery is narrowed at the C2 level and occluded at the C1 - occipital level.  Kink with mild narrowing proximal to mid right common carotid artery.  No significant stenosis of the proximal right internal carotid artery at the bulb level.  Just beyond this region there is a mild kink and mild narrowing.  Plaque at the proximal left internal carotid artery with less  than 20% diameter stenosis.  Mild ectasia of the vertical segment of the left internal carotid artery.  Prominent cervical spondylotic changes.  Right thyroid 2 cm lesion.  This can be assessed with ultrasound.  Scattered normal sized to prominent sized lymph nodes largest in the right level II region measures 1.4 x 1.4 x 1.8 cm.  Prominent calcification at the right submandibular gland level with diminutive size right side new line.    Review of the MIP images confirms the above findings.    IMPRESSION: Plaque at the origin of the dominant left vertebral artery with mild narrowing.  Calcified plaque with mild to moderate narrowing of the proximal aspect of the nondominant right vertebral artery.  Right vertebral artery is narrowed at the C2 level and occluded at the C1 - occipital level.  No hemodynamically significant stenosis involving either carotid bifurcation.  Prominent cervical spondylotic changes.  Right thyroid 2 cm lesion.  This can be assessed with ultrasound.  Scattered normal sized to prominent sized lymph nodes largest in the right level II region measures 1.4 x 1.4 x 1.8 cm.  Prominent calcification at the right submandibular gland level with diminutive size right side new line.    CTA HEAD  Findings:  No CT evidence of large acute infarct.  Small acute infarct cannot be excluded by CT.  Small vessel disease type changes.  Global atrophy without hydrocephalus.  No intracranial mass or abnormal enhancement.  Prominent panus formation and with mild impression upon the ventral aspect of the cervical medullary junction.  Occlusion of the right vertebral artery at the C1 - occipital level.  Right PICA is small although patent.  Distal right vertebral artery is patent.  Ectatic left vertebral artery with mild narrowing distally.  Irregularity with mild narrowing of the basilar artery.  Calcification with mild narrowing irregularity of the cavernous segment of the internal carotid arteries bilaterally.  Mild branch vessel irregularity.   Review of the MIP images confirms the above findings.    IMPRESSION: Occlusion of the right vertebral artery at the C1 - occipital level.  Right PICA is small although patent.  Distal right vertebral artery is patent.  Ectatic left vertebral artery with mild narrowing distally.  Irregularity with mild narrowing of the basilar artery.  Calcification with mild narrowing irregularity of the cavernous  segment of the internal carotid arteries bilaterally.  Mild branch vessel irregularity.  Complex left sided 1.9 cm of subdural hematoma and local mass effect appears relatively similar to the recent head CT.  No new intracranial hemorrhage detected.  Remote right cerebellar infarct.  The present examination was read preliminarily by Dr. Benard Rink at the time imaging.  On follow-up final report 12/08/2011 8:35 a.m.  Original Report Authenticated By: Fuller Canada, M.D.   Ct Head Wo Contrast  12/06/2011  *RADIOLOGY REPORT*  Clinical Data: History of previous syncopal episodes.  Episode of unresponsiveness today.  CT HEAD WITHOUT CONTRAST  Technique:  Contiguous axial images were obtained from the base of the skull through the vertex without contrast.  Comparison: 10/19/2011  Findings: There is an iso- to hypodense extracerebral fluid collection involving the left frontal and left parietal regions with maximal thickness of 19 mm (image 27). There is no midline shift.  This extra-axial fluid collection was not present previously and is consistent with a subacute to early chronic subdural hematoma.  There are no right-sided collections.  There is no evidence for acute stroke or parenchymal hemorrhage. There is no hydrocephalus.  Mild  atrophy is noted.  There may be an old lacunar infarct involving the right basal ganglia.  The calvarium is intact.  There are no acute sinus fluid collections.    IMPRESSION: Roughly 2 cm thick subacute to early chronic left subdural hematoma.  This was not present on previous exam.  Critical Value/emergent results were called by telephone at the time of interpretation on 12/06/2011  at 11:39 a.m.  to  Dr. Clarene Duke, who verbally acknowledged these results.  Original Report Authenticated By: Elsie Stain, M.D.     Medications:     Scheduled:   . lisinopril  10 mg Oral Daily    Assessment/Plan:    77 yo with history of syncopal episodes, vertigo and mechanical fall here  with syncopal episode No further recommendations  Felicie Morn PA-C [facility] [phone]  12/08/2011, 10:03 AM

## 2011-12-24 ENCOUNTER — Ambulatory Visit (INDEPENDENT_AMBULATORY_CARE_PROVIDER_SITE_OTHER): Payer: Medicare Other | Admitting: Cardiology

## 2011-12-24 ENCOUNTER — Encounter: Payer: Self-pay | Admitting: Cardiology

## 2011-12-24 VITALS — BP 126/80 | HR 78 | Ht 68.0 in | Wt 161.0 lb

## 2011-12-24 DIAGNOSIS — E7800 Pure hypercholesterolemia, unspecified: Secondary | ICD-10-CM

## 2011-12-24 DIAGNOSIS — R55 Syncope and collapse: Secondary | ICD-10-CM

## 2011-12-24 DIAGNOSIS — I119 Hypertensive heart disease without heart failure: Secondary | ICD-10-CM

## 2011-12-24 DIAGNOSIS — I10 Essential (primary) hypertension: Secondary | ICD-10-CM

## 2011-12-24 NOTE — Progress Notes (Signed)
Matthew Bright Date of Birth:  [birth year] [facility] / [facility] [address]   [address] [place], [place]  [address] [phone]           Fax   [phone]  History of Present Illness: This pleasant 78 year old gentleman is seen for a scheduled followup office visit.  He was recently hospitalized after having an episode of syncope.  Workup in the hospital included a CT angiogram of the neck which showed mild plaque but no significant stenosis.  He was advised at discharge to cut back on his lisinopril and take just 5 mg daily instead of 10.  He was also advised to double his water intake.  It was felt that his episode of syncope may have been substituted by a dehydrated state.  The patient has had no further episodes of dizziness or syncope.  Current Outpatient Prescriptions  Medication Sig Dispense Refill  . aspirin 81 MG tablet Take 81 mg by mouth daily.        . B Complex-Biotin-FA (B COMPLEX 100 TR) TBCR Take by mouth daily.        . Coenzyme Q10 (COQ10 PO) Take by mouth daily.        Marland Kitchen lisinopril (PRINIVIL,ZESTRIL) 5 MG tablet Take 2 tablets (10 mg total) by mouth at bedtime.  60 tablet  5  . Misc Natural Products (ADVANCED JOINT RELIEF PO) Take 2 tablets by mouth 2 (two) times daily.       . niacin 500 MG tablet Take 250 mg by mouth daily with breakfast.       . NON FORMULARY 1 tablet 2 (two) times daily.       . Probiotic Product (PRO-BIOTIC BLEND) CAPS Take 1 capsule by mouth daily with breakfast.          Allergies  Allergen Reactions  . Avalide (Irbesartan-Hydrochlorothiazide) Other (See Comments)  . Darvocet (Propoxyphene N-Acetaminophen) Other (See Comments)  . Lovastatin   . Zocor (Simvastatin)     Patient Active Problem List  Diagnoses  . HTN (hypertension)  . Abnormal EKG  . Hypercholesterolemia  . Syncope, vasovagal    History  Smoking status  . Never Smoker   Smokeless tobacco  . Not on file    History  Alcohol Use No    No  family history on file.  Review of Systems: Constitutional: no fever chills diaphoresis or fatigue or change in weight.  Head and neck: no hearing loss, no epistaxis, no photophobia or visual disturbance. Respiratory: No cough, shortness of breath or wheezing. Cardiovascular: No chest pain peripheral edema, palpitations. Gastrointestinal: No abdominal distention, no abdominal pain, no change in bowel habits hematochezia or melena. Genitourinary: No dysuria, no frequency, no urgency, no nocturia. Musculoskeletal:No arthralgias, no back pain, no gait disturbance or myalgias. Neurological: No dizziness, no headaches, no numbness, no seizures, no syncope, no weakness, no tremors. Hematologic: No lymphadenopathy, no easy bruising. Psychiatric: No confusion, no hallucinations, no sleep disturbance.    Physical Exam: Filed Vitals:   12/24/11 1151  BP: 126/80  Pulse: 78   general appearance reveals a well-developed alert elderly gentleman in no distress.Pupils equal and reactive.   Extraocular Movements are full.  There is no scleral icterus.  The mouth and pharynx are normal.  The neck is supple.  The carotids reveal no bruits.  The jugular venous pressure is normal.  The thyroid is not enlarged.  There is no lymphadenopathy.  The chest is clear to percussion and auscultation. There  are no rales or rhonchi. Expansion of the chest is symmetrical.  The precordium is quiet.  The first heart sound is normal.  The second heart sound is physiologically split.  There is no murmur gallop rub or click.  There is no abnormal lift or heave.  The abdomen is soft and nontender. Bowel sounds are normal. The liver and spleen are not enlarged. There Are no abdominal masses. There are no bruits.  The pedal pulses are good.  There is no phlebitis or edema.  There is no cyanosis or clubbing. Strength is normal and symmetrical in all extremities.  There is no lateralizing weakness.  There are no sensory  deficits.  The skin is warm and dry.  There is no rash.    Assessment / Plan: Continue present medication including lisinopril 5 mg once a day. Recheck in 4 months for followup office visit and EKG

## 2011-12-24 NOTE — Assessment & Plan Note (Signed)
The patient has a past history of hypercholesterolemia and he does have plaque visible in his carotids without obstructing lesions.  His primary care physician had suggested statin therapy about the patient is intolerant of statin therapy because of severe myalgias with evidence of CK elevation and a persistent leg weakness.  He will avoid statin therapy in the future but is amenable to a trial of Zetia and he will be discussing that with his primary care physician.

## 2011-12-24 NOTE — Patient Instructions (Signed)
Try increasing your exercise by riding exercise bike  Your physician recommends that you continue on your current medications as directed. Please refer to the Current Medication list given to you today.

## 2011-12-24 NOTE — Assessment & Plan Note (Signed)
The patient has had no further episodes of dizziness or syncope. 

## 2011-12-24 NOTE — Assessment & Plan Note (Signed)
The patient has a past history of high blood pressure.  His blood pressure has been remaining in the normal range on the lesser dose of lisinopril and he feels well.

## 2011-12-27 ENCOUNTER — Encounter: Payer: Self-pay | Admitting: Cardiology

## 2012-01-04 ENCOUNTER — Other Ambulatory Visit: Payer: Self-pay

## 2012-01-04 ENCOUNTER — Emergency Department (HOSPITAL_COMMUNITY)
Admission: EM | Admit: 2012-01-04 | Discharge: 2012-01-04 | Disposition: A | Discharge status: Home / Self Care | Payer: Medicare Other | Attending: Emergency Medicine | Admitting: Emergency Medicine

## 2012-01-04 ENCOUNTER — Encounter (HOSPITAL_COMMUNITY): Payer: Self-pay

## 2012-01-04 DIAGNOSIS — Z7982 Long term (current) use of aspirin: Secondary | ICD-10-CM | POA: Insufficient documentation

## 2012-01-04 DIAGNOSIS — I10 Essential (primary) hypertension: Secondary | ICD-10-CM | POA: Insufficient documentation

## 2012-01-04 DIAGNOSIS — Z8546 Personal history of malignant neoplasm of prostate: Secondary | ICD-10-CM | POA: Insufficient documentation

## 2012-01-04 DIAGNOSIS — M1990 Unspecified osteoarthritis, unspecified site: Secondary | ICD-10-CM | POA: Insufficient documentation

## 2012-01-04 DIAGNOSIS — R42 Dizziness and giddiness: Secondary | ICD-10-CM | POA: Insufficient documentation

## 2012-01-04 DIAGNOSIS — E785 Hyperlipidemia, unspecified: Secondary | ICD-10-CM | POA: Insufficient documentation

## 2012-01-04 DIAGNOSIS — R55 Syncope and collapse: Secondary | ICD-10-CM

## 2012-01-04 DIAGNOSIS — I498 Other specified cardiac arrhythmias: Secondary | ICD-10-CM | POA: Insufficient documentation

## 2012-01-04 DIAGNOSIS — Z79899 Other long term (current) drug therapy: Secondary | ICD-10-CM | POA: Insufficient documentation

## 2012-01-04 LAB — URINALYSIS, ROUTINE W REFLEX MICROSCOPIC
Leukocytes, UA: NEGATIVE
Nitrite: NEGATIVE
Specific Gravity, Urine: 1.012 (ref 1.005–1.030)
pH: 7 (ref 5.0–8.0)

## 2012-01-04 LAB — CBC
Hemoglobin: 12.2 g/dL — ABNORMAL LOW (ref 13.0–17.0)
MCH: 30 pg (ref 26.0–34.0)
MCHC: 34.4 g/dL (ref 30.0–36.0)
RDW: 13 % (ref 11.5–15.5)

## 2012-01-04 LAB — BASIC METABOLIC PANEL
Chloride: 99 mEq/L (ref 96–112)
GFR calc Af Amer: 90 mL/min — ABNORMAL LOW (ref >90)
Potassium: 4 mEq/L (ref 3.5–5.1)

## 2012-01-04 MED ORDER — SODIUM CHLORIDE 0.9 % IV BOLUS (SEPSIS)
500.0000 mL | Freq: Once | INTRAVENOUS | Status: AC
  Administered 2012-01-04: 500 mL via INTRAVENOUS

## 2012-01-04 MED ORDER — ACETAMINOPHEN 325 MG PO TABS
650.0000 mg | ORAL_TABLET | Freq: Once | ORAL | Status: AC
  Administered 2012-01-04: 650 mg via ORAL
  Filled 2012-01-04: qty 2

## 2012-01-04 NOTE — ED Provider Notes (Signed)
I saw and evaluated the patient, reviewed the resident's note and I agree with the findings and plan.  Reyes Aldaco P Margery Szostak, MD 01/04/12 1632 

## 2012-01-04 NOTE — ED Notes (Signed)
Family at bedside. Pt's spouse; Rickey Barbara and pt's daughter Augusto Gamble at bedside

## 2012-01-04 NOTE — ED Notes (Signed)
Pt stated that he has been having a headache since this AM. He stated that he was also having dizziness. Currently, patient states that he is not dizzy anymore. He is not in any respiratory distress. No CP or n/v. Complaining of a headache 5 out of 10. Medication given. Will continue to monitor.

## 2012-01-04 NOTE — ED Notes (Signed)
Per [facility], pt from home, pt was sitting down eating breakfast and had a syncopal episode x1, original sbp palpated on scene was 74, increased to sbp palpated 158/78, pt's sats originally 94 ra placed on 2 L increasing O2 sats to 100%. 20 g IV LH, 100 cc normal saline given en route, 12 lead showed 1st degree AV block

## 2012-01-04 NOTE — ED Notes (Addendum)
Pt c/o waking w/a headache this am, pt went to kitchen set down in a chair, checked bp in both arms, pt unsure of actual readings, sts "it was higher in L arm," pt became dizzy and light headed w/a syncopal episode while sitting in the chair, pt denies falling, hitting his head, or LOC. Pt denies chest pain, sob, N/V, or sweating. Pt reports hx of syncopal eoisodes

## 2012-01-04 NOTE — ED Notes (Signed)
[facility] 111 

## 2012-01-04 NOTE — ED Provider Notes (Signed)
I saw and evaluated the patient, reviewed the resident's note and I agree with the findings and plan. The patient is a 78 year old, male, with a history of postprandial syncope.  He had eaten and he had a syncopal episode.  He is back to normal now.  He denied chest pain, shortness of breath, palpitations, nausea, vomiting, or diarrhea.  He denies recent illness.  On examination, he is normal.  His EKG does not show any arrhythmias or signs of a STEMI.  We will release him to home to  Nicholes Stairs, MD 01/04/12 1515

## 2012-01-04 NOTE — ED Provider Notes (Signed)
History     CSN: [id number]  Arrival date & time 01/04/12  1123   First MD Initiated Contact with Patient 01/04/12 1146      Chief Complaint  Patient presents with  . Near Syncope    (Consider location/radiation/quality/duration/timing/severity/associated sxs/prior treatment) HPI history from patient and family  Patient is an 78 year old male with history of postprandial neuro-mediated syncope, hypertension who presents with presyncope. This started just prior to arrival. Patient had just finished eating breakfast when he had a brief period of lightheadedness. He did not have position change prior to this. He described lightheaded presyncopal sensation but no vertigo. This resolved on its own but was still mildly present when EMS arrived. Patient denies full loss of consciousness. No recent or preceding chest pain, palpitations, dyspnea. No recent issues with by mouth intake or infectious symptoms such as fever, cough, abdominal pain, diarrhea, vomiting. Patient has had similar episodes multiple times before. He has had a couple full syncopal episodes that were much like this. He has had workup for the syncope and has seen his primary care doctor as well as cardiologist for them. Working diagnosis is postprandial neuro- mediated syncope. No significant medication changes recently.  No modifying factors noted except for having eaten prior. Overall severity moderate inpatient asymptomatic on ED evaluation.    Past Medical History  Diagnosis Date  . Hyperlipidemia   . Enlarged prostate   . Prostate cancer   . Exertional dyspnea   . Syncope and collapse  2008 and again in October 2012  . Hypertension   . Cerebellar stroke, acute 2008  . S/P cardiac cath Jan. 2007    Cath at [facility] several years ago with no significant CAD noted.   . OA (osteoarthritis)   . Varicose veins     Past Surgical History  Procedure Date  . Prostate biopsy     X2  . Vein ligation and stripping   . Hip  surgery   . Lumbar laminectomy   . Hemorrhoid surgery   . Cervical discectomy     ANTERIOR C5-C6  . Inguinal hernia repair   . Cardiovascular stress test 11/13/2007    EF 67%, NO ISCHEMIA     History reviewed. No pertinent family history.  History  Substance Use Topics  . Smoking status: Never Smoker   . Smokeless tobacco: Not on file  . Alcohol Use: No      Review of Systems  Constitutional: Negative for fever, chills and activity change.  HENT: Negative for congestion and neck pain.   Respiratory: Negative for cough, chest tightness, shortness of breath and wheezing.   Cardiovascular: Negative for chest pain.  Gastrointestinal: Negative for nausea, vomiting, abdominal pain, diarrhea and abdominal distention.  Genitourinary: Negative for difficulty urinating.  Musculoskeletal: Negative for gait problem.  Skin: Negative for rash.  Neurological: Positive for light-headedness. Negative for weakness and numbness.  Psychiatric/Behavioral: Negative for behavioral problems and confusion.  All other systems reviewed and are negative.    Allergies  Avalide; Darvocet; Lovastatin; and Zocor  Home Medications   Current Outpatient Rx  Name Route Sig Dispense Refill  . ASPIRIN 81 MG PO TABS Oral Take 81 mg by mouth daily.      . B COMPLEX 100 TR PO TBCR Oral Take by mouth daily.      . COQ10 PO Oral Take by mouth daily.      Marland Kitchen LISINOPRIL 5 MG PO TABS Oral Take 5 mg by mouth at bedtime.    Marland Kitchen  ADVANCED JOINT RELIEF PO Oral Take 2 tablets by mouth 2 (two) times daily.     Marland Kitchen NIACIN 500 MG PO TABS Oral Take 250 mg by mouth daily with breakfast.     . NON FORMULARY  1 tablet 2 (two) times daily. Healthy prostate    . PRO-BIOTIC BLEND PO CAPS Oral Take 1 capsule by mouth daily with breakfast.        BP 179/82  Pulse 69  Temp(Src) 97.4 F (36.3 C) (Oral)  Resp 16  SpO2 99%  Physical Exam  Nursing note and vitals reviewed. Constitutional: He is oriented to person, place, and  time. He appears well-developed and well-nourished. No distress.  HENT:  Head: Normocephalic.  Nose: Nose normal.  Eyes: EOM are normal. Pupils are equal, round, and reactive to light. No scleral icterus.  Neck: Normal range of motion. Neck supple. No JVD present.  Cardiovascular: Regular rhythm and intact distal pulses.        Mild bradycardia  Pulmonary/Chest: Effort normal and breath sounds normal. No respiratory distress. He has no wheezes.  Abdominal: Soft. Bowel sounds are normal. He exhibits no distension. There is no tenderness.  Musculoskeletal: Normal range of motion. He exhibits no edema and no tenderness.       2+ DP pulses bilaterally  Neurological: He is alert and oriented to person, place, and time. He has normal strength. No cranial nerve deficit or sensory deficit. Coordination normal. GCS eye subscore is 4. GCS verbal subscore is 5. GCS motor subscore is 6.       Normal strength No pronator drift  Alert and oriented to person place and time. Answering questions appropriately.  Normal coordination Normal strength in each extremity.  Skin: Skin is warm and dry. He is not diaphoretic.  Psychiatric: He has a normal mood and affect. His behavior is normal. Thought content normal.    ED Course  Procedures (including critical care time)  ECG from 01/04/2012 at 1333. Heart rate 61. Sinus rhythm. First-degree heart block. Left axis deviation. No hypertrophy. Left anterior fascicular block. Nonspecific T wave change in lead 3 and aVF. Q waves in anterior leads. No ischemic ST changes. When compared to ECG from 12/07/2011, there is no significant change.  Labs Reviewed  CBC - Abnormal; Notable for the following:    RBC 4.06 (*)    Hemoglobin 12.2 (*)    HCT 35.5 (*)    All other components within normal limits  BASIC METABOLIC PANEL - Abnormal; Notable for the following:    Sodium 132 (*)    GFR calc non Af Amer 77 (*)    GFR calc Af Amer 90 (*)    All other components  within normal limits  URINALYSIS, ROUTINE W REFLEX MICROSCOPIC  POCT CBG MONITORING   No results found.   1. Near syncope       MDM   Patient here with near syncope after eating breakfast. He did not have full loss of consciousness. Patient has not had any recent palpitations, chest pain, dyspnea, exertional limitations or other cardiac symptoms. He also has not had any recent fever, URI symptoms, GI symptoms or other infectious symptoms. The timing and nature of his complaint are consistent with his known postprandial hypotension. Patient did have isolated systolic blood pressure reading of 74 with EMS, however this was in setting of his complaint and would be expected with his postprandial hypotension. No features that would suggest sepsis or other serious etiology for the transient hypotension.  EKG and labs here are unremarkable. Patient has a normal neurologic exam and he feels at his baseline on evaluation. Patient was monitored on cardiac monitoring ED and did not have any significant tachyarrhythmias. Patient is appropriate for discharge and outpatient followup. Results, treatment plan, return precautions were all discussed with patient and family. They're in agreement with plan.       Milus Glazier 01/04/12 1550

## 2012-01-10 ENCOUNTER — Telehealth: Payer: Self-pay | Admitting: Cardiology

## 2012-01-10 NOTE — Telephone Encounter (Signed)
PCP advised to change Lisinopril to 5 mg 1/2 twice daily and blood pressure 172/81 before breakfast and medications.  Did come down to 158/72 about 1 &1/2 hours later.  He saw his PCP as follow up from ED visit 1/15 syncopal episode.  Advised  Dr. Patty Sermons out of office until tomorrow but would forward to him for review and call him.

## 2012-01-10 NOTE — Telephone Encounter (Signed)
New Problem:    Patient called in wanting to speak with you about his lisinopril (PRINIVIL,ZESTRIL) 5 MG tablet and also wanted to know if his primary care doctor has contacted [place]. Please call back.

## 2012-01-10 NOTE — Telephone Encounter (Signed)
I agree with splitting the 5 mg dose in half and taking 1/2  Twice a day.

## 2012-01-11 NOTE — Telephone Encounter (Signed)
Advised patient and scheduled ov with Lawson Fiscal NP on Friday

## 2012-01-12 ENCOUNTER — Encounter: Payer: Self-pay | Admitting: Nurse Practitioner

## 2012-01-12 ENCOUNTER — Ambulatory Visit (INDEPENDENT_AMBULATORY_CARE_PROVIDER_SITE_OTHER): Payer: Medicare Other | Admitting: Nurse Practitioner

## 2012-01-12 VITALS — BP 148/78 | HR 78 | Ht 68.5 in | Wt 163.0 lb

## 2012-01-12 DIAGNOSIS — R55 Syncope and collapse: Secondary | ICD-10-CM

## 2012-01-12 MED ORDER — LISINOPRIL 5 MG PO TABS: 2.5000 mg | ORAL_TABLET | Freq: Every day | ORAL | Status: DC

## 2012-01-12 NOTE — Progress Notes (Signed)
[name] Date of Birth: September 24, 1926 Medical Record #[id number]  History of Present Illness: Matthew Bright is seen back today for a work in visit. He is seen for Dr. Patty Sermons. He was back in the ED last week with a near syncopal episode. It was really more of a lightheaded spell after eating breakfast. He did not pass out. He has had syncope back in November with a negative work up. This was felt to be related to dehydration and hypotension. He has been checking his blood pressure and having labile readings. He was advised to split dose his Lisinopril. He is seeing Neurology next month.  He comes in today. He is doing ok. He is trying to drink more water and stay hydrated. He has not had recurrent spells. He brings in his cuff to check for correlation. It is way off. No chest pain. He is a little unsteady on his feet.   Current Outpatient Prescriptions on File Prior to Visit  Medication Sig Dispense Refill  . aspirin 81 MG tablet Take 81 mg by mouth daily.        . B Complex-Biotin-FA (B COMPLEX 100 TR) TBCR Take by mouth daily.        . Coenzyme Q10 (COQ10 PO) Take by mouth daily.        . Misc Natural Products (ADVANCED JOINT RELIEF PO) Take 2 tablets by mouth 2 (two) times daily.       . niacin 500 MG tablet Take 250 mg by mouth daily with breakfast.       . NON FORMULARY 1 tablet 2 (two) times daily. Healthy prostate      . Probiotic Product (PRO-BIOTIC BLEND) CAPS Take 1 capsule by mouth daily with breakfast.        . DISCONTD: lisinopril (PRINIVIL,ZESTRIL) 5 MG tablet Take 2.5 mg by mouth 2 (two) times daily.         Allergies  Allergen Reactions  . Avalide (Irbesartan-Hydrochlorothiazide) Other (See Comments)  . Darvocet (Propoxyphene N-Acetaminophen) Other (See Comments)  . Lovastatin   . Zocor (Simvastatin)     Past Medical History  Diagnosis Date  . Hyperlipidemia   . Enlarged prostate   . Prostate cancer   . Exertional dyspnea   . Syncope and collapse  2008 and again in  October 2012  . Hypertension   . Cerebellar stroke, acute 2008  . S/P cardiac cath Jan. 2007    Cath at [facility] several years ago with no significant CAD noted.   . OA (osteoarthritis)   . Varicose veins     Past Surgical History  Procedure Date  . Prostate biopsy     X2  . Vein ligation and stripping   . Hip surgery   . Lumbar laminectomy   . Hemorrhoid surgery   . Cervical discectomy     ANTERIOR C5-C6  . Inguinal hernia repair   . Cardiovascular stress test 11/13/2007    EF 67%, NO ISCHEMIA     History  Smoking status  . Never Smoker   Smokeless tobacco  . Not on file    History  Alcohol Use No    History reviewed. No pertinent family history.  Review of Systems: The review of systems is per the HPI.  All other systems were reviewed and are negative.  Physical Exam: BP 148/78  Pulse 78  Ht 5' 8.5" (1.74 m)  Wt 163 lb (73.936 kg)  BMI 24.42 kg/m2 Blood pressure by me is 124/70. His machine  read 154/76. Patient is very pleasant and in no acute distress. Skin is warm and dry. Color is normal.  HEENT is unremarkable. Normocephalic/atraumatic. PERRL. Sclera are nonicteric. Neck is supple. No masses. No JVD. Lungs are clear. Cardiac exam shows a regular rate and rhythm. Abdomen is soft. Extremities are without edema. Gait and ROM are intact. No gross neurologic deficits noted.  LABORATORY DATA:   Assessment / Plan:

## 2012-01-12 NOTE — Patient Instructions (Signed)
Your blood pressure is ok. Your cuff does not correlate and is reading much higher.  Cut back the Lisinopril to just 2.5 mg once a day. Try to get a new blood pressure machine to monitor.   Stay hydrated.  I will see you in a month. Bring your blood pressure machine and your list of readings.   Call the [facility] office at [phone] if you have any questions, problems or concerns.

## 2012-01-12 NOTE — Assessment & Plan Note (Signed)
He has had recurrent lightheadedness. I think he feels like his blood pressure has been up by his cuff. His cuff does not correlate and is 30 points higher. We are going to cut back his lisinopril to just 2.5 mg at night starting tomorrow night. I will see him in a month. He is going to get a new blood pressure cuff as well. He will keep his appointment with neurology as well. Patient is agreeable to this plan and will call if any problems develop in the interim.

## 2012-01-14 ENCOUNTER — Ambulatory Visit: Payer: Medicare Other | Admitting: Nurse Practitioner

## 2012-01-28 ENCOUNTER — Telehealth: Payer: Self-pay | Admitting: Cardiology

## 2012-01-28 DIAGNOSIS — E7800 Pure hypercholesterolemia, unspecified: Secondary | ICD-10-CM

## 2012-01-28 NOTE — Telephone Encounter (Signed)
Orders in system

## 2012-01-28 NOTE — Telephone Encounter (Signed)
Pt called to make appt, has labs requested that visit, need order put in please, lipid profile, hfp,bmet

## 2012-02-11 ENCOUNTER — Encounter: Payer: Self-pay | Admitting: Nurse Practitioner

## 2012-02-11 ENCOUNTER — Ambulatory Visit (INDEPENDENT_AMBULATORY_CARE_PROVIDER_SITE_OTHER): Payer: Medicare Other | Admitting: Nurse Practitioner

## 2012-02-11 VITALS — BP 150/68 | HR 76 | Ht 68.5 in | Wt 166.0 lb

## 2012-02-11 DIAGNOSIS — R55 Syncope and collapse: Secondary | ICD-10-CM

## 2012-02-11 NOTE — Assessment & Plan Note (Signed)
He is doing well with his current regimen. No further spells. He is trying to stay hydrated. Blood pressure is satisfactory here today. We will see him back at his scheduled time in April. Patient is agreeable to this plan and will call if any problems develop in the interim.

## 2012-02-11 NOTE — Progress Notes (Signed)
Matthew Bright Date of Birth: 06-18-1926 Medical Record #[id number]  History of Present Illness: Matthew Bright is seen back today for a one month check. He is seen for Dr. Patty Sermons. He is a pleasant 78 year old male who has had recurrent near syncopal spells. These have felt to be due to over medication of his ACE, dehydration and cerebral vascular disease.   He comes in today. He has had a good month. No further spells. Saw neurology yesterday and sounds like he has some blockage of his vertebrals. He has a new blood pressure machine but left it out in the car. Says his readings are ok. He is feeling better. He is doing a better job of staying hydrated. No chest pain. Not short of breath. Stays active working on his bird houses.   Current Outpatient Prescriptions on File Prior to Visit  Medication Sig Dispense Refill  . aspirin 81 MG tablet Take 81 mg by mouth daily.        . B Complex-Biotin-FA (B COMPLEX 100 TR) TBCR Take by mouth daily.        . Coenzyme Q10 (COQ10 PO) Take by mouth daily.        Marland Kitchen lisinopril (PRINIVIL,ZESTRIL) 5 MG tablet Take 0.5 tablets (2.5 mg total) by mouth daily.      . Misc Natural Products (ADVANCED JOINT RELIEF PO) Take 2 tablets by mouth 2 (two) times daily.       . Multiple Vitamin (MULTIVITAMIN) tablet Take 1 tablet by mouth daily.      . niacin 500 MG tablet Take 250 mg by mouth daily with breakfast.       . NON FORMULARY 1 tablet 2 (two) times daily. Healthy prostate      . Probiotic Product (PRO-BIOTIC BLEND) CAPS Take 1 capsule by mouth daily with breakfast.          Allergies  Allergen Reactions  . Avalide (Irbesartan-Hydrochlorothiazide) Other (See Comments)  . Darvocet (Propoxyphene N-Acetaminophen) Other (See Comments)  . Lovastatin   . Zocor (Simvastatin)     Past Medical History  Diagnosis Date  . Hyperlipidemia   . Enlarged prostate   . Prostate cancer   . Exertional dyspnea   . Syncope and collapse  2008 and again in October 2012  .  Hypertension   . Cerebellar stroke, acute 2008  . S/P cardiac cath Jan. 2007    Cath at [facility] several years ago with no significant CAD noted.   . OA (osteoarthritis)   . Varicose veins   . Carotid artery disease     per CTA but no significant stenosis    Past Surgical History  Procedure Date  . Prostate biopsy     X2  . Vein ligation and stripping   . Hip surgery   . Lumbar laminectomy   . Hemorrhoid surgery   . Cervical discectomy     ANTERIOR C5-C6  . Inguinal hernia repair   . Cardiovascular stress test 11/13/2007    EF 67%, NO ISCHEMIA     History  Smoking status  . Never Smoker   Smokeless tobacco  . Not on file    History  Alcohol Use No    History reviewed. No pertinent family history.  Review of Systems: The review of systems is per the HPI.  All other systems were reviewed and are negative.  Physical Exam: BP 150/68  Pulse 76  Ht 5' 8.5" (1.74 m)  Wt 166 lb (75.297 kg)  BMI  24.87 kg/m2 Patient is very pleasant and in no acute distress. Skin is warm and dry. Color is normal.  HEENT is unremarkable. Normocephalic/atraumatic. PERRL. Sclera are nonicteric. Neck is supple. No masses. No JVD. Lungs are clear. Cardiac exam shows a regular rate and rhythm. Abdomen is soft. Extremities are without edema. Gait and ROM are intact. He is using a cane. No gross neurologic deficits noted.   LABORATORY DATA: N/A   Assessment / Plan:

## 2012-02-11 NOTE — Patient Instructions (Signed)
Stay on your current medicines.  We will see you back in April with Dr. Patty Sermons.  Call the [facility] office at [phone] if you have any questions, problems or concerns.

## 2012-03-01 ENCOUNTER — Encounter: Payer: Self-pay | Admitting: Cardiology

## 2012-03-03 ENCOUNTER — Telehealth: Payer: Self-pay | Admitting: Cardiology

## 2012-03-03 NOTE — Telephone Encounter (Signed)
Pt's primary care physician and Vascular Surgeon want pt to start Crestor however pt says He has had problems taking statins before and wants Dr. Yevonne Pax opinion prior to starting Crestor.  Advised pt that [facility] or Dr. Patty Sermons would be back in touch with him early next week.  He agrees.

## 2012-03-03 NOTE — Telephone Encounter (Signed)
Pt was told by Dr. Patty Sermons to stay away from all statin drugs but his pcp wants him to take crestor and pt wants to run this by someone at out office

## 2012-03-06 ENCOUNTER — Telehealth: Payer: Self-pay | Admitting: *Deleted

## 2012-03-06 NOTE — Telephone Encounter (Signed)
He can try a very low dose of Crestor  Half of a 5 mg tab on MWF only. If myalgias recur he will have to stop it altogether.

## 2012-03-06 NOTE — Telephone Encounter (Signed)
Advised patient

## 2012-03-06 NOTE — Telephone Encounter (Signed)
Called patient about Crestor and he told me about an episode that he had recently.  On Sunday 3/10 at lunch he was about to say prayer and he couldn't get any words out.  He stated that he knew what he wanted to say but just couldn't say anything.  He does go to vascular center in [place] every July.  Will forward to  Dr. Patty Sermons for review

## 2012-03-07 NOTE — Telephone Encounter (Signed)
He should follow up with his medical doctor about that.  The medical doctor may want him checked sooner by the vascular center.

## 2012-03-24 ENCOUNTER — Encounter: Payer: Self-pay | Admitting: Cardiology

## 2012-04-05 ENCOUNTER — Other Ambulatory Visit: Payer: Medicare Other

## 2012-04-05 ENCOUNTER — Encounter: Payer: Self-pay | Admitting: Cardiology

## 2012-04-05 ENCOUNTER — Ambulatory Visit (INDEPENDENT_AMBULATORY_CARE_PROVIDER_SITE_OTHER): Payer: Medicare Other | Admitting: Cardiology

## 2012-04-05 VITALS — BP 128/80 | HR 66 | Ht 68.0 in | Wt 164.0 lb

## 2012-04-05 DIAGNOSIS — E7800 Pure hypercholesterolemia, unspecified: Secondary | ICD-10-CM

## 2012-04-05 DIAGNOSIS — I10 Essential (primary) hypertension: Secondary | ICD-10-CM

## 2012-04-05 DIAGNOSIS — R55 Syncope and collapse: Secondary | ICD-10-CM

## 2012-04-05 DIAGNOSIS — R9431 Abnormal electrocardiogram [ECG] [EKG]: Secondary | ICD-10-CM

## 2012-04-05 NOTE — Assessment & Plan Note (Signed)
Blood pressure has been remaining stable on lisinopril 5 mg daily.  He is not having any dizziness or syncope

## 2012-04-05 NOTE — Telephone Encounter (Signed)
Patient seen today for ov  

## 2012-04-05 NOTE — Patient Instructions (Signed)
Your physician recommends that you continue on your current medications as directed. Please refer to the Current Medication list given to you today.  Your physician wants you to follow-up in: 6 months. You will receive a reminder letter in the mail two months in advance. If you don't receive a letter, please call our office to schedule the follow-up appointment.  

## 2012-04-05 NOTE — Assessment & Plan Note (Signed)
There have been no further episodes of dizziness or syncope since his lisinopril dose was cut back and since he is making an extra effort to stay hydrated

## 2012-04-05 NOTE — Progress Notes (Signed)
Matthew Bright Date of Birth:  29-Jul-1926 [facility] [address] [place], [place]  [address] [phone]         Fax   [phone]  History of Present Illness: This pleasant 78 year old gentleman is seen for a scheduled followup office visit.  He has a past history of essential hypertension and dyslipidemia.  He has also had a history of previous syncopal spells possibly secondary to overmedication of his face associated with some degree of dehydration.  His family physician and has been working him up for possible carotid artery disease and the patient has an appointment in [place] tomorrow to have a carotid duplex.  The patient has a history of dyslipidemia but is intolerant of high-dose statins because of the development of elevated CK levels presently he is on 2.5 of Crestor on 3 days a week and he has some repeat blood work pending to follow the course of his CK levels by his family doctor  Current Outpatient Prescriptions  Medication Sig Dispense Refill  . aspirin 81 MG tablet Take 325 mg by mouth daily.       . B Complex-Biotin-FA (B COMPLEX 100 TR) TBCR Take by mouth daily.        . Coenzyme Q10 (COQ10 PO) Take by mouth daily. Taking 200mg  daily      . lisinopril (PRINIVIL,ZESTRIL) 5 MG tablet Take 0.5 tablets (2.5 mg total) by mouth daily.      . Misc Natural Products (ADVANCED JOINT RELIEF PO) Take 2 tablets by mouth 2 (two) times daily.       . Multiple Vitamin (MULTIVITAMIN) tablet Take 1 tablet by mouth daily.      . niacin 500 MG tablet Take 250 mg by mouth daily with breakfast.       . NON FORMULARY 1 tablet 2 (two) times daily. Healthy prostate      . Probiotic Product (PRO-BIOTIC BLEND) CAPS Take 1 capsule by mouth daily with breakfast.        . rosuvastatin (CRESTOR) 5 MG tablet Take 5 mg by mouth daily. 1/2 three times a week        Allergies  Allergen Reactions  . Avalide (Irbesartan-Hydrochlorothiazide) Other (See Comments)  . Darvocet  (Propoxacet-N) Other (See Comments)  . Lovastatin   . Zocor (Simvastatin)     Patient Active Problem List  Diagnoses  . HTN (hypertension)  . Abnormal EKG  . Hypercholesterolemia  . Syncope, vasovagal    History  Smoking status  . Never Smoker   Smokeless tobacco  . Not on file    History  Alcohol Use No    No family history on file.  Review of Systems: Constitutional: no fever chills diaphoresis or fatigue or change in weight.  Head and neck: no hearing loss, no epistaxis, no photophobia or visual disturbance. Respiratory: No cough, shortness of breath or wheezing. Cardiovascular: No chest pain peripheral edema, palpitations. Gastrointestinal: No abdominal distention, no abdominal pain, no change in bowel habits hematochezia or melena. Genitourinary: No dysuria, no frequency, no urgency, no nocturia. Musculoskeletal:No arthralgias, no back pain, no gait disturbance or myalgias. Neurological: No dizziness, no headaches, no numbness, no seizures, no syncope, no weakness, no tremors. Hematologic: No lymphadenopathy, no easy bruising. Psychiatric: No confusion, no hallucinations, no sleep disturbance.    Physical Exam: Filed Vitals:   04/05/12 1015  BP: 128/80  Pulse: 66   the general appearance reveals a well-developed well-nourished gentleman in no distress.Pupils equal and reactive.  Extraocular Movements are full.  There is no scleral icterus.  The mouth and pharynx are normal.  The neck is supple.  The carotids reveal no bruits.  The jugular venous pressure is normal.  The thyroid is not enlarged.  There is no lymphadenopathy.  The chest is clear to percussion and auscultation. There are no rales or rhonchi. Expansion of the chest is symmetrical.  The precordium is quiet.  The first heart sound is normal.  The second heart sound is physiologically split.  There is no murmur gallop rub or click.  There is no abnormal lift or heave.  The abdomen is soft and  nontender. Bowel sounds are normal. The liver and spleen are not enlarged. There Are no abdominal masses. There are no bruits.  The pedal pulses are good.  There is no phlebitis or edema.  There is no cyanosis or clubbing. The skin is warm and dry.  There is no rash. Strength is normal and symmetrical in all extremities.  There is no lateralizing weakness.  There are no sensory deficits.  EKG today shows normal sinus rhythm at 63 per minute with first degree heart block and left anterior fascicular block and evidence of an old anteroseptal myocardial infarction.   Assessment / Plan: Continue same medication.  Recheck in 6 months for followup office visit

## 2012-04-05 NOTE — Assessment & Plan Note (Signed)
Patient brought with him some lab work.  His cholesterol on 03/01/12 was 177 with an LDL of 103 and his CK level was 505.  Followup lab work done in the same lab showed a cholesterol of 163 and an LDL of 81 and a total CK of 463, done on 03/24/12.  His lipids will be followed by his primary care physician who will also monitor the CK levels

## 2012-05-05 ENCOUNTER — Telehealth: Payer: Self-pay | Admitting: Cardiology

## 2012-05-05 MED ORDER — LISINOPRIL 5 MG PO TABS: 2.5000 mg | ORAL_TABLET | Freq: Every day | ORAL | Status: DC

## 2012-05-05 NOTE — Telephone Encounter (Signed)
rx sent to pharmacy

## 2012-05-05 NOTE — Telephone Encounter (Signed)
Please return call to patient at [phone] regarding medication change.

## 2012-06-07 ENCOUNTER — Encounter: Payer: Self-pay | Admitting: Cardiology

## 2012-09-26 ENCOUNTER — Other Ambulatory Visit (INDEPENDENT_AMBULATORY_CARE_PROVIDER_SITE_OTHER): Payer: Medicare Other

## 2012-09-26 DIAGNOSIS — E7800 Pure hypercholesterolemia, unspecified: Secondary | ICD-10-CM

## 2012-09-26 LAB — BASIC METABOLIC PANEL
BUN: 19 mg/dL (ref 6–23)
GFR: 88.8 mL/min (ref >60.00)
Potassium: 4.2 mEq/L (ref 3.5–5.1)
Sodium: 135 mEq/L (ref 135–145)

## 2012-09-26 LAB — HEPATIC FUNCTION PANEL
AST: 35 U/L (ref 0–37)
Alkaline Phosphatase: 54 U/L (ref 39–117)
Total Bilirubin: 0.9 mg/dL (ref 0.3–1.2)

## 2012-09-26 LAB — LIPID PANEL
Cholesterol: 153 mg/dL (ref 0–200)
LDL Cholesterol: 79 mg/dL (ref 0–99)
VLDL: 15.4 mg/dL (ref 0.0–40.0)

## 2012-09-27 NOTE — Progress Notes (Signed)
Quick Note:  Please make copy of labs for patient visit. ______ 

## 2012-10-03 ENCOUNTER — Ambulatory Visit (INDEPENDENT_AMBULATORY_CARE_PROVIDER_SITE_OTHER): Payer: Medicare Other | Admitting: Cardiology

## 2012-10-03 ENCOUNTER — Encounter: Payer: Self-pay | Admitting: Cardiology

## 2012-10-03 VITALS — BP 130/80 | HR 51 | Resp 18 | Ht 67.0 in | Wt 157.1 lb

## 2012-10-03 DIAGNOSIS — E7800 Pure hypercholesterolemia, unspecified: Secondary | ICD-10-CM

## 2012-10-03 DIAGNOSIS — I119 Hypertensive heart disease without heart failure: Secondary | ICD-10-CM

## 2012-10-03 DIAGNOSIS — R55 Syncope and collapse: Secondary | ICD-10-CM

## 2012-10-03 NOTE — Assessment & Plan Note (Signed)
The patient has a past history of elevated CK levels.  These levels have been checked by his family doctor.  The patient is still having some muscle aching in his left arm particularly.  He is on a very small dose of Crestor at this time but we will stop it all together temporarily and see if the muscle aching improves.  Patient is also to continue to drink plenty of water.

## 2012-10-03 NOTE — Assessment & Plan Note (Signed)
The patient has had no further episodes of syncope.  His blood pressure has been remaining stable.

## 2012-10-03 NOTE — Progress Notes (Signed)
[name] Date of Birth:  1926/01/02 [facility] [address] [place], [place]  [address] [phone]         Fax   [phone]  History of Present Illness: This pleasant 78 year old gentleman is seen for a scheduled followup office visit. He has a past history of essential hypertension and dyslipidemia. He has also had a history of previous syncopal spells possibly secondary to overmedication of his face associated with some degree of dehydration. His family physician has worked him up for possible carotid artery disease. The patient has a history of dyslipidemia but is intolerant of high-dose statins because of the development of elevated CK levels.   presently he is on 2.5 of Crestor on 3 days a week and he has some repeat blood work pending to follow the course of his CK levels by his family doctor. Since last visit he has had no new cardiac symptoms.  He had a recent check of his leg circulation by his vascular physician in [place].   Current Outpatient Prescriptions  Medication Sig Dispense Refill  . aspirin 81 MG tablet Take 325 mg by mouth daily.       . B Complex-Biotin-FA (B COMPLEX 100 TR) TBCR Take by mouth daily.        . Coenzyme Q10 (COQ10 PO) Take by mouth daily. Taking 200mg  daily      . lisinopril (PRINIVIL,ZESTRIL) 5 MG tablet Take 0.5 tablets (2.5 mg total) by mouth daily.  90 tablet  3  . Misc Natural Products (ADVANCED JOINT RELIEF PO) Take 2 tablets by mouth 2 (two) times daily.       . Multiple Vitamin (MULTIVITAMIN) tablet Take 1 tablet by mouth daily.      . niacin 500 MG tablet Take 250 mg by mouth daily with breakfast.       . NON FORMULARY 1 tablet 2 (two) times daily. Healthy prostate      . Probiotic Product (PRO-BIOTIC BLEND) CAPS Take 1 capsule by mouth daily with breakfast.          Allergies  Allergen Reactions  . Avalide (Irbesartan-Hydrochlorothiazide) Other (See Comments)  . Darvocet (Propoxyphene-Acetaminophen) Other  (See Comments)  . Lovastatin   . Zocor (Simvastatin)     Patient Active Problem List  Diagnosis  . HTN (hypertension)  . Abnormal EKG  . Hypercholesterolemia  . Syncope, vasovagal    History  Smoking status  . Never Smoker   Smokeless tobacco  . Not on file    History  Alcohol Use No    No family history on file.  Review of Systems: Constitutional: no fever chills diaphoresis or fatigue or change in weight.  Head and neck: no hearing loss, no epistaxis, no photophobia or visual disturbance. Respiratory: No cough, shortness of breath or wheezing. Cardiovascular: No chest pain peripheral edema, palpitations. Gastrointestinal: No abdominal distention, no abdominal pain, no change in bowel habits hematochezia or melena. Genitourinary: No dysuria, no frequency, no urgency, no nocturia. Musculoskeletal:No arthralgias, no back pain, no gait disturbance or myalgias. Neurological: No dizziness, no headaches, no numbness, no seizures, no syncope, no weakness, no tremors. Hematologic: No lymphadenopathy, no easy bruising. Psychiatric: No confusion, no hallucinations, no sleep disturbance.    Physical Exam: Filed Vitals:   10/03/12 0849  BP: 130/80  Pulse: 51  Resp: 18   the general appearance reveals a well-developed elderly gentleman in no acute distress.Pupils equal and reactive.   Extraocular Movements are full.  There is no  scleral icterus.  The mouth and pharynx are normal.  The neck is supple.  The carotids reveal no bruits.  The jugular venous pressure is normal.  The thyroid is not enlarged.  There is no lymphadenopathy.  The chest is clear to percussion and auscultation. There are no rales or rhonchi. Expansion of the chest is symmetrical.  The precordium is quiet.  The first heart sound is normal.  The second heart sound is physiologically split.  There is no murmur gallop rub or click.  There is no abnormal lift or heave.  The abdomen is soft and nontender. Bowel  sounds are normal. The liver and spleen are not enlarged. There Are no abdominal masses. There are no bruits.  The pedal pulses are good.  The pedal pulses are weaker on the left foot than the right foot and he has had several previous vein stripping operations on his left leg dating back to the 1950s. There is no phlebitis or edema.  There is no cyanosis or clubbing.    Assessment / Plan: Continue same medication except stop Crestor and Zetia muscle aching improves.  Recheck in 6 months for followup office visit and fasting lab work

## 2012-10-03 NOTE — Patient Instructions (Signed)
Stop your Crestor and see if your muscle aching improves, call back and let us know  Continue to drink plenty of fluids  Your physician wants you to follow-up in: 6 months with fasting labs (lp/bmet/hfp/total ck)  You will receive a reminder letter in the mail two months in advance. If you don't receive a letter, please call our office to schedule the follow-up appointment.

## 2013-03-26 ENCOUNTER — Emergency Department (HOSPITAL_COMMUNITY)
Admission: EM | Admit: 2013-03-26 | Discharge: 2013-03-26 | Disposition: A | Discharge status: Home / Self Care | Payer: Medicare Other | Attending: Emergency Medicine | Admitting: Emergency Medicine

## 2013-03-26 ENCOUNTER — Encounter (HOSPITAL_COMMUNITY): Payer: Self-pay | Admitting: Emergency Medicine

## 2013-03-26 ENCOUNTER — Ambulatory Visit: Payer: Self-pay | Admitting: Family Medicine

## 2013-03-26 DIAGNOSIS — Z8679 Personal history of other diseases of the circulatory system: Secondary | ICD-10-CM | POA: Insufficient documentation

## 2013-03-26 DIAGNOSIS — M1990 Unspecified osteoarthritis, unspecified site: Secondary | ICD-10-CM | POA: Insufficient documentation

## 2013-03-26 DIAGNOSIS — Z8546 Personal history of malignant neoplasm of prostate: Secondary | ICD-10-CM | POA: Insufficient documentation

## 2013-03-26 DIAGNOSIS — I2510 Atherosclerotic heart disease of native coronary artery without angina pectoris: Secondary | ICD-10-CM | POA: Insufficient documentation

## 2013-03-26 DIAGNOSIS — Z79899 Other long term (current) drug therapy: Secondary | ICD-10-CM | POA: Insufficient documentation

## 2013-03-26 DIAGNOSIS — Z7982 Long term (current) use of aspirin: Secondary | ICD-10-CM | POA: Insufficient documentation

## 2013-03-26 DIAGNOSIS — I10 Essential (primary) hypertension: Secondary | ICD-10-CM | POA: Insufficient documentation

## 2013-03-26 DIAGNOSIS — Z8709 Personal history of other diseases of the respiratory system: Secondary | ICD-10-CM | POA: Insufficient documentation

## 2013-03-26 DIAGNOSIS — Z8673 Personal history of transient ischemic attack (TIA), and cerebral infarction without residual deficits: Secondary | ICD-10-CM | POA: Insufficient documentation

## 2013-03-26 DIAGNOSIS — E785 Hyperlipidemia, unspecified: Secondary | ICD-10-CM | POA: Insufficient documentation

## 2013-03-26 DIAGNOSIS — R55 Syncope and collapse: Secondary | ICD-10-CM

## 2013-03-26 LAB — CBC WITH DIFFERENTIAL/PLATELET
Basophils Absolute: 0 10*3/uL (ref 0.0–0.1)
Basophils Relative: 0 % (ref 0–1)
Eosinophils Absolute: 0.2 10*3/uL (ref 0.0–0.7)
Lymphs Abs: 1 10*3/uL (ref 0.7–4.0)
MCH: 31.1 pg (ref 26.0–34.0)
Neutrophils Relative %: 79 % — ABNORMAL HIGH (ref 43–77)
Platelets: 181 10*3/uL (ref 150–400)
RBC: 4.18 MIL/uL — ABNORMAL LOW (ref 4.22–5.81)
RDW: 13.5 % (ref 11.5–15.5)

## 2013-03-26 LAB — BASIC METABOLIC PANEL
GFR calc Af Amer: 87 mL/min — ABNORMAL LOW (ref >90)
GFR calc non Af Amer: 75 mL/min — ABNORMAL LOW (ref >90)
Glucose, Bld: 108 mg/dL — ABNORMAL HIGH (ref 70–99)
Potassium: 4.3 mEq/L (ref 3.5–5.1)
Sodium: 132 mEq/L — ABNORMAL LOW (ref 135–145)

## 2013-03-26 NOTE — ED Provider Notes (Signed)
History     CSN: [id number]  Arrival date & time 03/26/13  1443   First MD Initiated Contact with Patient 03/26/13 1451      Chief Complaint  Patient presents with  . Loss of Consciousness  . Fatigue    (Consider location/radiation/quality/duration/timing/severity/associated sxs/prior treatment) HPI...Marland KitchenMarland Kitchen status post syncopal episode this morning. Patient had been n.p.o. after midnight for blood work today.  He is sitting at the kitchen table when the episode occurred. He was out for approximately 5 minutes. This is happened one other time in the past. He is now completely back to normal. No significant prodromal events.  Severity is moderate. No chest pain, dyspnea, neuro deficits, fever, chills.  Past Medical History  Diagnosis Date  . Hyperlipidemia   . Enlarged prostate   . Prostate cancer   . Exertional dyspnea   . Syncope and collapse  2008 and again in October 2012  . Hypertension   . Cerebellar stroke, acute 2008  . S/P cardiac cath Jan. 2007    Cath at [facility] several years ago with no significant CAD noted.   . OA (osteoarthritis)   . Varicose veins   . Carotid artery disease     per CTA but no significant stenosis    Past Surgical History  Procedure Laterality Date  . Prostate biopsy      X2  . Vein ligation and stripping    . Hip surgery    . Lumbar laminectomy    . Hemorrhoid surgery    . Cervical discectomy      ANTERIOR C5-C6  . Inguinal hernia repair    . Cardiovascular stress test  11/13/2007    EF 67%, NO ISCHEMIA     No family history on file.  History  Substance Use Topics  . Smoking status: Never Smoker   . Smokeless tobacco: Not on file  . Alcohol Use: No      Review of Systems  All other systems reviewed and are negative.    Allergies  Avalide; Darvocet; Lovastatin; and Zocor  Home Medications   Current Outpatient Rx  Name  Route  Sig  Dispense  Refill  . aspirin 325 MG tablet   Oral   Take 325 mg by mouth daily.          . B Complex-Biotin-FA (B COMPLEX 100 TR) TBCR   Oral   Take 1 tablet by mouth daily.          . Coenzyme Q10 (COQ10 PO)   Oral   Take 1 capsule by mouth 2 (two) times daily. Taking 200mg  daily         . lisinopril (PRINIVIL,ZESTRIL) 5 MG tablet   Oral   Take 0.5 tablets (2.5 mg total) by mouth daily.   90 tablet   3   . Multiple Vitamin (MULTIVITAMIN) tablet   Oral   Take 1 tablet by mouth daily.         . niacinamide 500 MG tablet   Oral   Take 250 mg by mouth 2 (two) times daily with a meal.         . NON FORMULARY      1 tablet 2 (two) times daily. Healthy prostate         . Probiotic Product (PRO-BIOTIC BLEND) CAPS   Oral   Take 1 capsule by mouth daily with breakfast.             BP 148/66  Pulse 58  Temp(Src) 97.7  F (36.5 C) (Oral)  Resp 16  SpO2 99%  Physical Exam  Nursing note and vitals reviewed. Constitutional: He is oriented to person, place, and time. He appears well-developed and well-nourished.  HENT:  Head: Normocephalic and atraumatic.  Eyes: Conjunctivae and EOM are normal. Pupils are equal, round, and reactive to light.  Neck: Normal range of motion. Neck supple.  Cardiovascular: Normal rate, regular rhythm and normal heart sounds.   Pulmonary/Chest: Effort normal and breath sounds normal.  Abdominal: Soft. Bowel sounds are normal.  Musculoskeletal: Normal range of motion.  Neurological: He is alert and oriented to person, place, and time.  Skin: Skin is warm and dry.  Psychiatric: He has a normal mood and affect.    ED Course  Procedures (including critical care time)  Labs Reviewed  CBC WITH DIFFERENTIAL - Abnormal; Notable for the following:    RBC 4.18 (*)    HCT 36.6 (*)    Neutrophils Relative 79 (*)    Lymphocytes Relative 10 (*)    All other components within normal limits  BASIC METABOLIC PANEL   No results found.   No diagnosis found.   Date: 03/26/2013  Rate: 60  Rhythm: normal sinus rhythm  QRS  Axis: normal  Intervals: normal  ST/T Wave abnormalities: normal  Conduction Disutrbances:right bundle branch block;   1st degree av block  Narrative Interpretation:   Old EKG Reviewed: changes noted   MDM  Patient appears to be back to his baseline. He is alert and oriented x3. No neuro deficits. He had not eaten nor drank since midnight.  Screening EKG and blood work showed no acute problems        Donnetta Hutching, MD 03/26/13 [phone]

## 2013-03-26 NOTE — ED Notes (Addendum)
Per EMS: Pt was sitting at his kitchen table and had a witnessed syncopal episode - 5-10 minutes of disorientation. Initial BP low for Fire. A&O when EMS arrived. CBG 138. BP 117/48, HR 56, RR 16, O2 99% RA. Pt had HA prior to LOC, knew he was going to pass out. Had a similar episode in Dec 2012.

## 2013-03-26 NOTE — ED Notes (Signed)
Pt denies CP, SOB, n/v/d, diaphoresis. Denies pain at this time.

## 2013-04-02 ENCOUNTER — Other Ambulatory Visit (INDEPENDENT_AMBULATORY_CARE_PROVIDER_SITE_OTHER): Payer: Medicare Other

## 2013-04-02 ENCOUNTER — Ambulatory Visit (INDEPENDENT_AMBULATORY_CARE_PROVIDER_SITE_OTHER): Payer: Medicare Other | Admitting: Cardiology

## 2013-04-02 ENCOUNTER — Encounter: Payer: Self-pay | Admitting: Cardiology

## 2013-04-02 VITALS — BP 168/76 | HR 71 | Ht 68.0 in | Wt 167.2 lb

## 2013-04-02 DIAGNOSIS — R0989 Other specified symptoms and signs involving the circulatory and respiratory systems: Secondary | ICD-10-CM

## 2013-04-02 DIAGNOSIS — E7800 Pure hypercholesterolemia, unspecified: Secondary | ICD-10-CM

## 2013-04-02 DIAGNOSIS — I10 Essential (primary) hypertension: Secondary | ICD-10-CM

## 2013-04-02 DIAGNOSIS — E871 Hypo-osmolality and hyponatremia: Secondary | ICD-10-CM | POA: Insufficient documentation

## 2013-04-02 DIAGNOSIS — R55 Syncope and collapse: Secondary | ICD-10-CM

## 2013-04-02 LAB — BASIC METABOLIC PANEL
BUN: 16 mg/dL (ref 6–23)
CO2: 26 mEq/L (ref 19–32)
Chloride: 98 mEq/L (ref 96–112)
Creatinine, Ser: 0.9 mg/dL (ref 0.4–1.5)
Glucose, Bld: 84 mg/dL (ref 70–99)
Potassium: 3.8 mEq/L (ref 3.5–5.1)

## 2013-04-02 LAB — CBC WITH DIFFERENTIAL/PLATELET
Basophils Absolute: 0.1 10*3/uL (ref 0.0–0.1)
HCT: 39.2 % (ref 39.0–52.0)
Hemoglobin: 13 g/dL (ref 13.0–17.0)
Lymphs Abs: 1.4 10*3/uL (ref 0.7–4.0)
MCV: 92 fl (ref 78.0–100.0)
Monocytes Absolute: 0.6 10*3/uL (ref 0.1–1.0)
Neutro Abs: 4.1 10*3/uL (ref 1.4–7.7)
Platelets: 204 10*3/uL (ref 150.0–400.0)
RDW: 13.8 % (ref 11.5–14.6)

## 2013-04-02 LAB — HEPATIC FUNCTION PANEL
Alkaline Phosphatase: 57 U/L (ref 39–117)
Bilirubin, Direct: 0.1 mg/dL (ref 0.0–0.3)
Total Bilirubin: 0.6 mg/dL (ref 0.3–1.2)

## 2013-04-02 NOTE — Assessment & Plan Note (Signed)
Blood pressure was remaining stable on current medication.  Her symptoms of angina pectoris or congestive heart failure.  There is trace edema which is related to venous insufficiency

## 2013-04-02 NOTE — Assessment & Plan Note (Signed)
The patient has a history of hypercholesterolemia.  We did not check his lipids today because he states that they were checked in [facility] by his primary care provider last week.  He has not heard the results yet.

## 2013-04-02 NOTE — Assessment & Plan Note (Signed)
His most recent episode of syncope occurred later in the day after he had given blood for his annual physical and had gone without eating for most of the morning.  He states that his syncopal episodes are always related with either eating or hypovolemia and never with exertion itself.

## 2013-04-02 NOTE — Progress Notes (Signed)
Quick Note:  Please report to patient. The recent labs are stable. Continue same medication and careful diet. The total CK is back up to 405. Stay off statins permanently. CBC shows no anemia. WBC normal. Serum sodium is still slightly low so increase dietary salt slightly and avoid drinking too much fluids. ______

## 2013-04-02 NOTE — Progress Notes (Signed)
Matthew Bright Date of Birth:  04-29-1926 [facility] [address] [place], [place]  [address] [phone]         Fax   [phone]  History of Present Illness: This pleasant 79 year old gentleman is seen for a scheduled followup office visit. He has a past history of essential hypertension and dyslipidemia. He has also had a history of previous syncopal spells possibly secondary to overmedication associated with some degree of dehydration. His family physician has worked him up for possible carotid artery disease. The patient has a history of dyslipidemia but is intolerant of high-dose statins because of the development of elevated CK levels.  Since we last saw him he has developed worsening leg weakness and on his own he stopped taking Crestor altogether back in October. Since last visit he has had no new cardiac symptoms. He had a recent check of his leg circulation by his vascular physician in [place].  He has an appointment to see Dr. Eulah Pont for evaluation of leg and knee pain tomorrow. The patient had an episode of syncope a week ago and was evaluated in: Emergency room for 4 hours and was released.  Blood work obtained at that time showed mild hyponatremia.  The patient has an appointment to see a neurologist at the current level clinic in about one month.  The patient's son does not want the patient to drive until cleared by neurology.   Current Outpatient Prescriptions  Medication Sig Dispense Refill  . aspirin 325 MG tablet Take 325 mg by mouth daily.      . B Complex-Biotin-FA (B COMPLEX 100 TR) TBCR Take 1 tablet by mouth daily.       . Coenzyme Q10 (COQ10 PO) Take 1 capsule by mouth 2 (two) times daily. Taking 200mg  daily      . lisinopril (PRINIVIL,ZESTRIL) 5 MG tablet Take 0.5 tablets (2.5 mg total) by mouth daily.  90 tablet  3  . Multiple Vitamin (MULTIVITAMIN) tablet Take 1 tablet by mouth daily.      . niacinamide 500 MG tablet Take 250 mg by mouth  2 (two) times daily with a meal.      . NON FORMULARY 1 tablet 2 (two) times daily. Healthy prostate      . Probiotic Product (PRO-BIOTIC BLEND) CAPS Take 1 capsule by mouth daily with breakfast.         No current facility-administered medications for this visit.    Allergies  Allergen Reactions  . Avalide (Irbesartan-Hydrochlorothiazide) Other (See Comments)    Unknown  . Darvocet (Propoxyphene-Acetaminophen) Other (See Comments)    Unknown  . Lovastatin     Unknown  . Zocor (Simvastatin)     Unknown    Patient Active Problem List  Diagnosis  . HTN (hypertension)  . Abnormal EKG  . Hypercholesterolemia  . Syncope, vasovagal  . Hyposmolality and/or hyponatremia    History  Smoking status  . Never Smoker   Smokeless tobacco  . Not on file    History  Alcohol Use No    No family history on file.  Review of Systems: Constitutional: no fever chills diaphoresis or fatigue or change in weight.  Head and neck: no hearing loss, no epistaxis, no photophobia or visual disturbance. Respiratory: No cough, shortness of breath or wheezing. Cardiovascular: No chest pain peripheral edema, palpitations. Gastrointestinal: No abdominal distention, no abdominal pain, no change in bowel habits hematochezia or melena. Genitourinary: No dysuria, no frequency, no urgency, no nocturia. Musculoskeletal:No arthralgias,  no back pain, no gait disturbance or myalgias. Neurological: No dizziness, no headaches, no numbness, no seizures, no syncope, no weakness, no tremors. Hematologic: No lymphadenopathy, no easy bruising. Psychiatric: No confusion, no hallucinations, no sleep disturbance.    Physical Exam: Filed Vitals:   04/02/13 0919  BP: 168/76  Pulse: 71   general appearance reveals an elderly gentleman who is quite stooped over from arthritis.  He walks with a cane.The head and neck exam reveals pupils equal and reactive.  Extraocular movements are full.  There is no scleral  icterus.  The mouth and pharynx are normal.  The neck is supple.  The carotids reveal no bruits.  The jugular venous pressure is normal.  The  thyroid is not enlarged.  There is no lymphadenopathy.  The chest is clear to percussion and auscultation.  There are no rales or rhonchi.  Expansion of the chest is symmetrical.  The precordium is quiet.  The first heart sound is normal.  The second heart sound is physiologically split.  There is no  gallop rub or click.  There is a grade 1/6 systolic ejection murmur at the aortic area.  There is no diastolic murmur.  There is no abnormal lift or heave.  The abdomen is soft and nontender.  The bowel sounds are normal.  The liver and spleen are not enlarged.  There are no abdominal masses.  There are no abdominal bruits.  Extremities reveal good pedal pulses.  There is trace peripheral edema. There is no cyanosis or clubbing.  Strength is normal and symmetrical in all extremities.  There is no lateralizing weakness.  There are no sensory deficits.  The skin is warm and dry.  There is no rash.    Assessment / Plan: Continue same medication.  We are checking electrolytes today to followup on hyponatremia.  Recheck in 6 months for followup office visit EKG CBC CK total, lipid panel hepatic function panel and basal metabolic panel

## 2013-04-02 NOTE — Patient Instructions (Addendum)
Will obtain labs today and call you with the results (lp/bmet/hfp/ck)  Your physician recommends that you continue on your current medications as directed. Please refer to the Current Medication list given to you today.  Your physician wants you to follow-up in: 6 months with fasting labs (lp/bmet/hfp/ck/cbc) and ekg  You will receive a reminder letter in the mail two months in advance. If you don't receive a letter, please call our office to schedule the follow-up appointment.

## 2013-04-05 ENCOUNTER — Telehealth: Payer: Self-pay | Admitting: Cardiology

## 2013-04-05 NOTE — Telephone Encounter (Signed)
New problem   Pt want to know results of his blood work. Please call pt

## 2013-04-05 NOTE — Telephone Encounter (Signed)
Message copied by Burnell Blanks on Thu Apr 05, 2013  5:30 PM ------      Message from: Cassell Clement      Created: Mon Apr 02, 2013  9:59 PM       Please report to patient.  The recent labs are stable. Continue same medication and careful diet. The total CK is back up to 405.  Stay off statins permanently. CBC shows no anemia.  WBC normal.      Serum sodium is still slightly low so increase dietary salt slightly and avoid drinking too much fluids. ------

## 2013-04-05 NOTE — Telephone Encounter (Signed)
Advised patient of lab results  

## 2013-05-11 ENCOUNTER — Other Ambulatory Visit: Payer: Self-pay | Admitting: *Deleted

## 2013-05-11 MED ORDER — LISINOPRIL 5 MG PO TABS: 2.5000 mg | ORAL_TABLET | Freq: Every day | ORAL | Status: DC

## 2013-05-16 ENCOUNTER — Encounter: Payer: Self-pay | Admitting: *Deleted

## 2013-08-16 ENCOUNTER — Ambulatory Visit (INDEPENDENT_AMBULATORY_CARE_PROVIDER_SITE_OTHER): Payer: Medicare Other | Admitting: General Surgery

## 2013-08-16 ENCOUNTER — Encounter: Payer: Self-pay | Admitting: General Surgery

## 2013-08-16 VITALS — BP 140/80 | Ht 68.0 in | Wt 156.0 lb

## 2013-08-16 DIAGNOSIS — I8393 Asymptomatic varicose veins of bilateral lower extremities: Secondary | ICD-10-CM

## 2013-08-16 DIAGNOSIS — I8390 Asymptomatic varicose veins of unspecified lower extremity: Secondary | ICD-10-CM

## 2013-08-16 NOTE — Progress Notes (Signed)
Patient ID: Matthew Bright, male   DOB: 23-Dec-1925, 79 y.o.   MRN: [id number]  Chief Complaint  Patient presents with  . Follow-up    varicose veins    HPI Matthew Bright is a 79 y.o. male.  Patient here today for follow up varicose veins. Patient reports no leg symptoms and is still using compression hose with good control. Wearing a brace to right leg from orthopedic physician.  Denies any leg complaints today.  HPI  Past Medical History  Diagnosis Date  . Hyperlipidemia   . Enlarged prostate   . Prostate cancer   . Exertional dyspnea   . Syncope and collapse  2008 and again in October 2012  . Hypertension   . Cerebellar stroke, acute 2008  . S/P cardiac cath Jan. 2007    Cath at [facility] several years ago with no significant CAD noted.   . OA (osteoarthritis)   . Varicose veins   . Carotid artery disease     per CTA but no significant stenosis  . Allergy   . Hernia 2006  . Special screening for malignant neoplasms, colon   . Vertigo 2008    Past Surgical History  Procedure Laterality Date  . Prostate biopsy      X2  . Vein ligation and stripping      1953, 1968, 1992  . Hip surgery Bilateral 1991, 2001    right than left hip replacement  . Lumbar laminectomy    . Hemorrhoid surgery    . Cervical discectomy      ANTERIOR C5-C6  . Cardiovascular stress test  11/13/2007    EF 67%, NO ISCHEMIA   . Inguinal hernia repair Right 2007,2009  . Colonoscopy  2008    Dr. Jarold Motto in [place]  . Eye surgery Bilateral 2010    cataract    Family History  Problem Relation Age of Onset  . Stroke Father   . Heart attack Father     Social History History  Substance Use Topics  . Smoking status: Never Smoker   . Smokeless tobacco: Not on file  . Alcohol Use: No    Allergies  Allergen Reactions  . Avalide [Irbesartan-Hydrochlorothiazide] Other (See Comments)    Unknown  . Darvocet [Propoxyphene-Acetaminophen] Other (See Comments)    Unknown  . Lovastatin     Unknown   . Zocor [Simvastatin]     Unknown    Current Outpatient Prescriptions  Medication Sig Dispense Refill  . aspirin 325 MG tablet Take 325 mg by mouth daily.      . B Complex-Biotin-FA (B COMPLEX 100 TR) TBCR Take 1 tablet by mouth daily.       . Coenzyme Q10 (COQ10 PO) Take 1 capsule by mouth 2 (two) times daily. Taking 200mg  daily      . lisinopril (PRINIVIL,ZESTRIL) 5 MG tablet Take 0.5 tablets (2.5 mg total) by mouth daily.  45 tablet  3  . Multiple Vitamins-Minerals (CENTRUM PO) Take by mouth daily.      . niacinamide 500 MG tablet Take 250 mg by mouth 2 (two) times daily with a meal.      . Nutritional Supplements (JOINT FORMULA PO) Take 2 tablets by mouth 2 (two) times daily.      . Probiotic Product (PROBIOTIC DAILY PO) Take 1 tablet by mouth daily.       No current facility-administered medications for this visit.    Review of Systems Review of Systems  Constitutional: Negative.   Respiratory:  Negative.   Cardiovascular: Negative.     Blood pressure 140/80, height 5\' 8"  (1.727 m), weight 156 lb (70.761 kg).  Physical Exam Physical Exam  Constitutional: He is oriented to person, place, and time. He appears well-developed and well-nourished.  Cardiovascular: Normal rate and regular rhythm.   Pulses:      Dorsalis pedis pulses are 2+ on the right side, and 2+ on the left side.       Posterior tibial pulses are 2+ on the right side, and 2+ on the left side.  No edema noted bilateral lower extremities Residual varicose veins left > right legs  Pulmonary/Chest: Effort normal and breath sounds normal.  Abdominal: Soft. No hernia.  Neurological: He is alert and oriented to person, place, and time.  Skin: Skin is warm and dry.    Data Reviewed none  Assessment    Stable asymptomatic VV. Has been very compliant with use of compression hose and has no leg symptoms.    Plan    Continue compression hose.       Omari Mcmanaway G 08/16/2013, 10:23 AM

## 2013-08-16 NOTE — Patient Instructions (Addendum)
The patient is aware to call back for any questions or concerns. Continue to wear compression hose 

## 2013-10-04 ENCOUNTER — Ambulatory Visit (INDEPENDENT_AMBULATORY_CARE_PROVIDER_SITE_OTHER): Payer: Medicare Other | Admitting: Cardiology

## 2013-10-04 ENCOUNTER — Encounter: Payer: Self-pay | Admitting: Cardiology

## 2013-10-04 VITALS — BP 148/79 | HR 70 | Ht 68.0 in | Wt 158.0 lb

## 2013-10-04 DIAGNOSIS — E871 Hypo-osmolality and hyponatremia: Secondary | ICD-10-CM

## 2013-10-04 DIAGNOSIS — R55 Syncope and collapse: Secondary | ICD-10-CM

## 2013-10-04 DIAGNOSIS — R9431 Abnormal electrocardiogram [ECG] [EKG]: Secondary | ICD-10-CM

## 2013-10-04 DIAGNOSIS — I10 Essential (primary) hypertension: Secondary | ICD-10-CM

## 2013-10-04 DIAGNOSIS — I119 Hypertensive heart disease without heart failure: Secondary | ICD-10-CM

## 2013-10-04 LAB — BASIC METABOLIC PANEL
Chloride: 98 mEq/L (ref 96–112)
Potassium: 4 mEq/L (ref 3.5–5.1)

## 2013-10-04 NOTE — Assessment & Plan Note (Signed)
Blood pressure was remaining stable on current medication.  He has occasional peripheral edema.

## 2013-10-04 NOTE — Assessment & Plan Note (Signed)
The patient has had no further episodes of syncope.  He is making a good effort to be sure that he stays well hydrated.  He drinks plenty of water.  He has had a past history of hyponatremia and we are checking a basal metabolic panel today.

## 2013-10-04 NOTE — Assessment & Plan Note (Signed)
EKG today shows improvement since last tracing and the previous inferolateral T-wave inversions are no longer seen.

## 2013-10-04 NOTE — Progress Notes (Signed)
Quick Note:  Please report to patient. The recent labs are stable. Continue same medication and careful diet. Sodium 132 Stable no change. ______

## 2013-10-04 NOTE — Progress Notes (Signed)
Matthew Bright Date of Birth:  June 17, 1926 [address] [facility] [address] [place], [place]  [address] [phone]         Fax   [phone]  History of Present Illness: This pleasant 79 year old gentleman is seen for a scheduled followup office visit. He has a past history of essential hypertension and dyslipidemia. He has also had a history of previous syncopal spells possibly secondary to overmedication associated with some degree of dehydration. His family physician has worked him up for possible carotid artery disease. The patient has a history of dyslipidemia but is intolerant of high-dose statins because of the development of elevated CK levels.  Since we last saw him he has developed worsening leg weakness and on his own he stopped taking Crestor altogether back in October 2014. Since last visit he has had no new cardiac symptoms. He had a recent check of his leg circulation by his vascular physician in [place].  The patient had an episode of syncope a week ago and was evaluated in: Emergency room for 4 hours and was released.  Blood work obtained at that time showed mild hyponatremia.  The patient has an appointment to see a neurologist at the current [facility] clinic in about one month.  The patient's son does not want the patient to drive until cleared by neurology.   Current Outpatient Prescriptions  Medication Sig Dispense Refill  . aspirin 325 MG tablet Take 325 mg by mouth daily.      . B Complex-Biotin-FA (B COMPLEX 100 TR) TBCR Take 1 tablet by mouth daily.       . Coenzyme Q10 (COQ10 PO) Take 1 capsule by mouth 2 (two) times daily. Taking 200mg  daily      . lisinopril (PRINIVIL,ZESTRIL) 5 MG tablet Take 0.5 tablets (2.5 mg total) by mouth daily.  45 tablet  3  . Multiple Vitamins-Minerals (CENTRUM PO) Take by mouth daily.      . niacinamide 500 MG tablet Take 250 mg by mouth daily.       . Nutritional Supplements (JOINT FORMULA PO) Take 2 tablets by mouth 2 (two) times daily.       . Probiotic Product (PROBIOTIC DAILY PO) Take 1 tablet by mouth daily.       No current facility-administered medications for this visit.    Allergies  Allergen Reactions  . Avalide [Irbesartan-Hydrochlorothiazide] Other (See Comments)    Unknown  . Darvocet [Propoxyphene-Acetaminophen] Other (See Comments)    Unknown  . Lovastatin     Unknown  . Zocor [Simvastatin]     Unknown    Patient Active Problem List   Diagnosis Date Noted  . Asymptomatic varicose veins 08/16/2013  . Hyposmolality and/or hyponatremia 04/02/2013  . Syncope, vasovagal 12/08/2011  . Hypercholesterolemia 11/30/2011  . HTN (hypertension) 10/22/2011  . Abnormal EKG 10/22/2011    History  Smoking status  . Never Smoker   Smokeless tobacco  . Not on file    History  Alcohol Use No    Family History  Problem Relation Age of Onset  . Stroke Father   . Heart attack Father     Review of Systems: Constitutional: no fever chills diaphoresis or fatigue or change in weight.  Head and neck: no hearing loss, no epistaxis, no photophobia or visual disturbance. Respiratory: No cough, shortness of breath or wheezing. Cardiovascular: No chest pain peripheral edema, palpitations. Gastrointestinal: No abdominal distention, no abdominal pain, no change in bowel habits hematochezia or melena. Genitourinary: No dysuria, no  frequency, no urgency, no nocturia. Musculoskeletal:No arthralgias, no back pain, no gait disturbance or myalgias. Neurological: No dizziness, no headaches, no numbness, no seizures, no syncope, no weakness, no tremors. Hematologic: No lymphadenopathy, no easy bruising. Psychiatric: No confusion, no hallucinations, no sleep disturbance.    Physical Exam: Filed Vitals:   10/04/13 1402  BP: 148/79  Pulse: 70   general appearance reveals an elderly gentleman who is quite stooped over from arthritis.  He walks with a cane.The head and neck exam reveals pupils equal and reactive.   Extraocular movements are full.  There is no scleral icterus.  The mouth and pharynx are normal.  The neck is supple.  The carotids reveal no bruits.  The jugular venous pressure is normal.  The  thyroid is not enlarged.  There is no lymphadenopathy.  The chest is clear to percussion and auscultation.  There are no rales or rhonchi.  Expansion of the chest is symmetrical.  The precordium is quiet.  The first heart sound is normal.  The second heart sound is physiologically split.  There is no  gallop rub or click.  There is a grade 1/6 systolic ejection murmur at the aortic area.  There is no diastolic murmur.  There is no abnormal lift or heave.  The abdomen is soft and nontender.  The bowel sounds are normal.  The liver and spleen are not enlarged.  There are no abdominal masses.  There are no abdominal bruits.  Extremities reveal good pedal pulses.  There is trace peripheral edema. There is no cyanosis or clubbing.  Strength is normal and symmetrical in all extremities.  There is no lateralizing weakness.  There are no sensory deficits.  The skin is warm and dry.  There is no rash.  EKG shows normal sinus rhythm with left anterior fascicular block and improvement in inferolateral T-wave abnormalities.  Assessment / Plan: Continue same medication.  We are checking electrolytes today to followup on hyponatremia.  Recheck in 6 months for followup office visit.

## 2013-10-04 NOTE — Patient Instructions (Signed)
Will obtain labs today and call you with the results (bmet)  Your physician recommends that you continue on your current medications as directed. Please refer to the Current Medication list given to you today.  Your physician wants you to follow-up in: 6 month ov You will receive a reminder letter in the mail two months in advance. If you don't receive a letter, please call our office to schedule the follow-up appointment.  

## 2013-10-05 ENCOUNTER — Telehealth: Payer: Self-pay | Admitting: *Deleted

## 2013-10-05 NOTE — Telephone Encounter (Signed)
Message copied by Burnell Blanks on Fri Oct 05, 2013  2:17 PM ------      Message from: Cassell Clement      Created: Thu Oct 04, 2013  9:35 PM       Please report to patient.  The recent labs are stable. Continue same medication and careful diet. Sodium 132  Stable no change. ------

## 2013-10-05 NOTE — Telephone Encounter (Signed)
No answer. Mailed copy of labs, highlighted  Dr. Yevonne Pax comments

## 2014-04-05 ENCOUNTER — Encounter: Payer: Self-pay | Admitting: Cardiology

## 2014-04-05 ENCOUNTER — Ambulatory Visit (INDEPENDENT_AMBULATORY_CARE_PROVIDER_SITE_OTHER): Payer: Medicare Other | Admitting: Cardiology

## 2014-04-05 VITALS — BP 160/68 | HR 64 | Ht 68.0 in | Wt 151.0 lb

## 2014-04-05 DIAGNOSIS — I10 Essential (primary) hypertension: Secondary | ICD-10-CM

## 2014-04-05 DIAGNOSIS — R55 Syncope and collapse: Secondary | ICD-10-CM

## 2014-04-05 DIAGNOSIS — I119 Hypertensive heart disease without heart failure: Secondary | ICD-10-CM

## 2014-04-05 DIAGNOSIS — I359 Nonrheumatic aortic valve disorder, unspecified: Secondary | ICD-10-CM | POA: Insufficient documentation

## 2014-04-05 DIAGNOSIS — E871 Hypo-osmolality and hyponatremia: Secondary | ICD-10-CM

## 2014-04-05 NOTE — Assessment & Plan Note (Signed)
Blood pressure is stable on current therapy.  The patient has had no further episodes of orthostatic hypotension or dizziness.

## 2014-04-05 NOTE — Patient Instructions (Signed)
Your physician recommends that you continue on your current medications as directed. Please refer to the Current Medication list given to you today.  Your physician wants you to follow-up in: 6 month ov/ekg You will receive a reminder letter in the mail two months in advance. If you don't receive a letter, please call our office to schedule the follow-up appointment.   Your physician has requested that you have an echocardiogram. Echocardiography is a painless test that uses sound waves to create images of your heart. It provides your doctor with information about the size and shape of your heart and how well your heart's chambers and valves are working. This procedure takes approximately one hour. There are no restrictions for this procedure.   

## 2014-04-05 NOTE — Progress Notes (Signed)
Matthew Bright Date of Birth:  04/13/1926 [address] [place], [place]  [address] [phone]         Fax   [phone]  History of Present Illness: This pleasant 80 year old gentleman is seen for a scheduled followup office visit. He has a past history of essential hypertension and dyslipidemia. He has also had a history of previous syncopal spells possibly secondary to overmedication associated with some degree of dehydration. His family physician has worked him up for possible carotid artery disease. The patient has a history of dyslipidemia but is intolerant of high-dose statins because of the development of elevated CK levels.  Since we last saw him he has developed worsening leg weakness and on his own he stopped taking Crestor altogether back in October 2014. Since last visit he has had no new cardiac symptoms. He had a recent check of his leg circulation by his vascular physician in [place].  He has had problems with his back.  He is now wearing a back brace and getting physical therapy to his back.  He had a recent bone density test which showed osteopenia.  Dr. Percell Miller is his orthopedist and he also sees Dr. Layne Benton in that practice.  He has a past history of aortic valve disease with aortic insufficiency. The patient had an episode of syncope a week ago and was evaluated in: Emergency room for 4 hours and was released.  Blood work obtained at that time showed mild hyponatremia.  The patient has an appointment to see a neurologist at the current [facility] clinic in about one month.  The patient's son does not want the patient to drive until cleared by neurology.   Current Outpatient Prescriptions  Medication Sig Dispense Refill  . aspirin 325 MG tablet Take 325 mg by mouth daily.      . B Complex-Biotin-FA (B COMPLEX 100 TR) TBCR Take 1 tablet by mouth daily.       . Coenzyme Q10 (COQ10 PO) Take 1 capsule by mouth 2 (two) times daily. Taking 200mg  daily      .  lisinopril (PRINIVIL,ZESTRIL) 5 MG tablet Take 0.5 tablets (2.5 mg total) by mouth daily.  45 tablet  3  . Multiple Vitamins-Minerals (CENTRUM PO) Take by mouth daily.      . niacinamide 500 MG tablet Take 250 mg by mouth daily.       . Nutritional Supplements (JOINT FORMULA PO) Take 2 tablets by mouth 2 (two) times daily.      . Probiotic Product (PROBIOTIC DAILY PO) Take 1 tablet by mouth daily.       No current facility-administered medications for this visit.    Allergies  Allergen Reactions  . Avalide [Irbesartan-Hydrochlorothiazide] Other (See Comments)    Unknown  . Darvocet [Propoxyphene N-Acetaminophen] Other (See Comments)    Unknown  . Lovastatin     Unknown  . Zocor [Simvastatin]     Unknown    Patient Active Problem List   Diagnosis Date Noted  . Aortic valve disease 04/05/2014  . Asymptomatic varicose veins 08/16/2013  . Hyposmolality and/or hyponatremia 04/02/2013  . Syncope, vasovagal 12/08/2011  . Hypercholesterolemia 11/30/2011  . HTN (hypertension) 10/22/2011  . Abnormal EKG 10/22/2011    History  Smoking status  . Never Smoker   Smokeless tobacco  . Not on file    History  Alcohol Use No    Family History  Problem Relation Age of Onset  . Stroke Father   .  Heart attack Father     Review of Systems: Constitutional: no fever chills diaphoresis or fatigue or change in weight.  Head and neck: no hearing loss, no epistaxis, no photophobia or visual disturbance. Respiratory: No cough, shortness of breath or wheezing. Cardiovascular: No chest pain peripheral edema, palpitations. Gastrointestinal: No abdominal distention, no abdominal pain, no change in bowel habits hematochezia or melena. Genitourinary: No dysuria, no frequency, no urgency, no nocturia. Musculoskeletal:No arthralgias, no back pain, no gait disturbance or myalgias. Neurological: No dizziness, no headaches, no numbness, no seizures, no syncope, no weakness, no tremors. Hematologic:  No lymphadenopathy, no easy bruising. Psychiatric: No confusion, no hallucinations, no sleep disturbance.    Physical Exam: Filed Vitals:   04/05/14 1152  BP: 160/68  Pulse: 64   general appearance reveals an elderly gentleman who is quite stooped over from arthritis.  He walks with a cane.The head and neck exam reveals pupils equal and reactive.  Extraocular movements are full.  There is no scleral icterus.  The mouth and pharynx are normal.  The neck is supple.  The carotids reveal no bruits.  The jugular venous pressure is normal.  The  thyroid is not enlarged.  There is no lymphadenopathy.  The chest is clear to percussion and auscultation.  There are no rales or rhonchi.  Expansion of the chest is symmetrical.  The precordium is quiet.  The first heart sound is normal.  The second heart sound is physiologically split.  There is no  gallop rub or click.  There is a grade 1/6 systolic ejection murmur at the aortic area.  There is no diastolic murmur.  There is no abnormal lift or heave.  The abdomen is soft and nontender.  The bowel sounds are normal.  The liver and spleen are not enlarged.  There are no abdominal masses.  There are no abdominal bruits.  Extremities reveal good pedal pulses.  There is trace peripheral edema. There is no cyanosis or clubbing.  Strength is normal and symmetrical in all extremities.  There is no lateralizing weakness.  There are no sensory deficits.  The skin is warm and dry.  There is no rash.    Assessment / Plan: Continue same medication.  We will update his echocardiogram.  He will be rechecked in 6 months for office visit and EKG

## 2014-04-05 NOTE — Assessment & Plan Note (Signed)
The patient has known aortic insufficiency.  His last echocardiogram 10/27/11 showed an ejection fraction of 55-60%.  There was mild aortic insufficiency, mild mitral regurgitation, and significant left atrial enlargement. The patient has not been having symptoms of fluid overload or CHF.

## 2014-04-05 NOTE — Assessment & Plan Note (Signed)
Patient has a past history of hyponatremia followed by his PCP

## 2014-04-19 ENCOUNTER — Ambulatory Visit (HOSPITAL_COMMUNITY): Payer: Medicare Other | Attending: Internal Medicine | Admitting: Cardiology

## 2014-04-19 DIAGNOSIS — I10 Essential (primary) hypertension: Secondary | ICD-10-CM | POA: Insufficient documentation

## 2014-04-19 DIAGNOSIS — E785 Hyperlipidemia, unspecified: Secondary | ICD-10-CM | POA: Insufficient documentation

## 2014-04-19 DIAGNOSIS — I517 Cardiomegaly: Secondary | ICD-10-CM | POA: Insufficient documentation

## 2014-04-19 DIAGNOSIS — I359 Nonrheumatic aortic valve disorder, unspecified: Secondary | ICD-10-CM | POA: Insufficient documentation

## 2014-04-19 NOTE — Progress Notes (Signed)
Echo performed. 

## 2014-05-02 ENCOUNTER — Telehealth: Payer: Self-pay | Admitting: *Deleted

## 2014-05-02 NOTE — Telephone Encounter (Signed)
Message copied by Earvin Hansen on Thu May 02, 2014  6:29 PM ------      Message from: Darlin Coco      Created: Mon Apr 22, 2014  7:56 AM       Please report.  Echo is satisfactory. Normal EF. Mild diastolic dysfunction. The aortic insufficiency is still mild. Continue same meds. ------

## 2014-05-02 NOTE — Telephone Encounter (Signed)
Line still busy (have called with busy signal on three separate days), mailed copy and highlighted Dr Sherryl Barters comments with note to call if any questions. Printed copy to be discussed at next ov

## 2014-05-07 ENCOUNTER — Telehealth: Payer: Self-pay | Admitting: Cardiology

## 2014-05-07 NOTE — Telephone Encounter (Signed)
New message      Pt have a change in pharmacy-------new pharmacy is [facility] not [facility] on [address]. And want echo results

## 2014-05-07 NOTE — Telephone Encounter (Signed)
No answer, will try again.

## 2014-05-15 ENCOUNTER — Other Ambulatory Visit: Payer: Self-pay | Admitting: *Deleted

## 2014-05-15 MED ORDER — LISINOPRIL 5 MG PO TABS: 2.5000 mg | ORAL_TABLET | Freq: Every day | ORAL | Status: DC

## 2014-07-08 ENCOUNTER — Emergency Department (HOSPITAL_COMMUNITY): Payer: Medicare Other

## 2014-07-08 ENCOUNTER — Observation Stay (HOSPITAL_COMMUNITY)
Admission: EM | Admit: 2014-07-08 | Discharge: 2014-07-09 | Disposition: A | Discharge status: Home / Self Care | Payer: Medicare Other | Attending: Internal Medicine | Admitting: Internal Medicine

## 2014-07-08 ENCOUNTER — Encounter (HOSPITAL_COMMUNITY): Payer: Self-pay | Admitting: Emergency Medicine

## 2014-07-08 DIAGNOSIS — E785 Hyperlipidemia, unspecified: Secondary | ICD-10-CM | POA: Insufficient documentation

## 2014-07-08 DIAGNOSIS — I10 Essential (primary) hypertension: Secondary | ICD-10-CM | POA: Insufficient documentation

## 2014-07-08 DIAGNOSIS — M1990 Unspecified osteoarthritis, unspecified site: Secondary | ICD-10-CM | POA: Diagnosis not present

## 2014-07-08 DIAGNOSIS — E7800 Pure hypercholesterolemia, unspecified: Secondary | ICD-10-CM

## 2014-07-08 DIAGNOSIS — M6281 Muscle weakness (generalized): Secondary | ICD-10-CM | POA: Diagnosis not present

## 2014-07-08 DIAGNOSIS — I2510 Atherosclerotic heart disease of native coronary artery without angina pectoris: Secondary | ICD-10-CM | POA: Diagnosis not present

## 2014-07-08 DIAGNOSIS — I658 Occlusion and stenosis of other precerebral arteries: Secondary | ICD-10-CM | POA: Insufficient documentation

## 2014-07-08 DIAGNOSIS — R51 Headache: Secondary | ICD-10-CM | POA: Insufficient documentation

## 2014-07-08 DIAGNOSIS — I359 Nonrheumatic aortic valve disorder, unspecified: Secondary | ICD-10-CM

## 2014-07-08 DIAGNOSIS — R32 Unspecified urinary incontinence: Secondary | ICD-10-CM | POA: Diagnosis not present

## 2014-07-08 DIAGNOSIS — N400 Enlarged prostate without lower urinary tract symptoms: Secondary | ICD-10-CM | POA: Diagnosis not present

## 2014-07-08 DIAGNOSIS — C61 Malignant neoplasm of prostate: Secondary | ICD-10-CM | POA: Diagnosis not present

## 2014-07-08 DIAGNOSIS — R4701 Aphasia: Secondary | ICD-10-CM | POA: Insufficient documentation

## 2014-07-08 DIAGNOSIS — G459 Transient cerebral ischemic attack, unspecified: Secondary | ICD-10-CM | POA: Diagnosis present

## 2014-07-08 DIAGNOSIS — Z96649 Presence of unspecified artificial hip joint: Secondary | ICD-10-CM | POA: Diagnosis not present

## 2014-07-08 DIAGNOSIS — R9431 Abnormal electrocardiogram [ECG] [EKG]: Secondary | ICD-10-CM

## 2014-07-08 DIAGNOSIS — E871 Hypo-osmolality and hyponatremia: Secondary | ICD-10-CM

## 2014-07-08 DIAGNOSIS — R55 Syncope and collapse: Principal | ICD-10-CM | POA: Insufficient documentation

## 2014-07-08 LAB — DIFFERENTIAL
Basophils Absolute: 0 10*3/uL (ref 0.0–0.1)
Basophils Relative: 1 % (ref 0–1)
Eosinophils Absolute: 0.2 10*3/uL (ref 0.0–0.7)
Eosinophils Relative: 2 % (ref 0–5)
LYMPHS ABS: 1 10*3/uL (ref 0.7–4.0)
LYMPHS PCT: 14 % (ref 12–46)
Monocytes Absolute: 0.4 10*3/uL (ref 0.1–1.0)
Monocytes Relative: 6 % (ref 3–12)
NEUTROS PCT: 77 % (ref 43–77)
Neutro Abs: 5.4 10*3/uL (ref 1.7–7.7)

## 2014-07-08 LAB — URINALYSIS, ROUTINE W REFLEX MICROSCOPIC
Bilirubin Urine: NEGATIVE
Glucose, UA: NEGATIVE mg/dL
Hgb urine dipstick: NEGATIVE
Ketones, ur: NEGATIVE mg/dL
Leukocytes, UA: NEGATIVE
Nitrite: NEGATIVE
PROTEIN: NEGATIVE mg/dL
Specific Gravity, Urine: 1.011 (ref 1.005–1.030)
UROBILINOGEN UA: 0.2 mg/dL (ref 0.0–1.0)
pH: 7.5 (ref 5.0–8.0)

## 2014-07-08 LAB — GLUCOSE, CAPILLARY
Glucose-Capillary: 235 mg/dL — ABNORMAL HIGH (ref 70–99)
Glucose-Capillary: 121 mg/dL — ABNORMAL HIGH (ref 70–99)

## 2014-07-08 LAB — CBG MONITORING, ED: GLUCOSE-CAPILLARY: 94 mg/dL (ref 70–99)

## 2014-07-08 LAB — COMPREHENSIVE METABOLIC PANEL
ALK PHOS: 59 U/L (ref 39–117)
ALT: 23 U/L (ref 0–53)
AST: 34 U/L (ref 0–37)
Albumin: 3.8 g/dL (ref 3.5–5.2)
Anion gap: 11 (ref 5–15)
BUN: 19 mg/dL (ref 6–23)
CHLORIDE: 101 meq/L (ref 96–112)
CO2: 27 meq/L (ref 19–32)
Calcium: 8.8 mg/dL (ref 8.4–10.5)
Creatinine, Ser: 0.9 mg/dL (ref 0.50–1.35)
GFR calc Af Amer: 87 mL/min — ABNORMAL LOW (ref >90)
GFR, EST NON AFRICAN AMERICAN: 75 mL/min — AB (ref >90)
GLUCOSE: 93 mg/dL (ref 70–99)
POTASSIUM: 4.1 meq/L (ref 3.7–5.3)
SODIUM: 139 meq/L (ref 137–147)
Total Bilirubin: 0.5 mg/dL (ref 0.3–1.2)
Total Protein: 7 g/dL (ref 6.0–8.3)

## 2014-07-08 LAB — TSH: TSH: 2.3 u[IU]/mL (ref 0.350–4.500)

## 2014-07-08 LAB — CBC
HCT: 40.8 % (ref 39.0–52.0)
HCT: 38.9 % — ABNORMAL LOW (ref 39.0–52.0)
Hemoglobin: 13.7 g/dL (ref 13.0–17.0)
Hemoglobin: 13 g/dL (ref 13.0–17.0)
MCH: 31.2 pg (ref 26.0–34.0)
MCH: 30.6 pg (ref 26.0–34.0)
MCHC: 33.6 g/dL (ref 30.0–36.0)
MCHC: 33.4 g/dL (ref 30.0–36.0)
MCV: 92.9 fL (ref 78.0–100.0)
MCV: 91.5 fL (ref 78.0–100.0)
PLATELETS: 199 10*3/uL (ref 150–400)
Platelets: 182 10*3/uL (ref 150–400)
RBC: 4.39 MIL/uL (ref 4.22–5.81)
RBC: 4.25 MIL/uL (ref 4.22–5.81)
RDW: 13.9 % (ref 11.5–15.5)
RDW: 14 % (ref 11.5–15.5)
WBC: 7.1 10*3/uL (ref 4.0–10.5)
WBC: 7 10*3/uL (ref 4.0–10.5)

## 2014-07-08 LAB — RAPID URINE DRUG SCREEN, HOSP PERFORMED
AMPHETAMINES: NOT DETECTED
Barbiturates: NOT DETECTED
Benzodiazepines: NOT DETECTED
Cocaine: NOT DETECTED
OPIATES: NOT DETECTED
Tetrahydrocannabinol: NOT DETECTED

## 2014-07-08 LAB — CREATININE, SERUM
Creatinine, Ser: 0.86 mg/dL (ref 0.50–1.35)
GFR calc non Af Amer: 76 mL/min — ABNORMAL LOW (ref >90)
GFR, EST AFRICAN AMERICAN: 88 mL/min — AB (ref >90)

## 2014-07-08 LAB — ETHANOL: Alcohol, Ethyl (B): <11 mg/dL (ref 0–11)

## 2014-07-08 LAB — I-STAT CG4 LACTIC ACID, ED: LACTIC ACID, VENOUS: 0.54 mmol/L (ref 0.5–2.2)

## 2014-07-08 LAB — APTT: aPTT: 27 seconds (ref 24–37)

## 2014-07-08 LAB — PROTIME-INR
INR: 1.11 (ref 0.00–1.49)
PROTHROMBIN TIME: 14.3 s (ref 11.6–15.2)

## 2014-07-08 LAB — I-STAT TROPONIN, ED: Troponin i, poc: 0.04 ng/mL (ref 0.00–0.08)

## 2014-07-08 LAB — TROPONIN I

## 2014-07-08 MED ORDER — ASPIRIN EC 81 MG PO TBEC
81.0000 mg | DELAYED_RELEASE_TABLET | Freq: Every day | ORAL | Status: DC
  Administered 2014-07-08 – 2014-07-09 (×2): 81 mg via ORAL
  Filled 2014-07-08 (×2): qty 1

## 2014-07-08 MED ORDER — ACETAMINOPHEN 325 MG PO TABS
650.0000 mg | ORAL_TABLET | Freq: Once | ORAL | Status: AC
  Administered 2014-07-08: 650 mg via ORAL
  Filled 2014-07-08: qty 2

## 2014-07-08 MED ORDER — COQ10 200 MG PO CAPS: 200.0000 mg | ORAL_CAPSULE | Freq: Every day | ORAL | Status: DC

## 2014-07-08 MED ORDER — ENOXAPARIN SODIUM 40 MG/0.4ML ~~LOC~~ SOLN
40.0000 mg | SUBCUTANEOUS | Status: DC
  Administered 2014-07-08 – 2014-07-09 (×2): 40 mg via SUBCUTANEOUS
  Filled 2014-07-08 (×2): qty 0.4

## 2014-07-08 MED ORDER — NIACINAMIDE 500 MG PO TABS
250.0000 mg | ORAL_TABLET | Freq: Every day | ORAL | Status: DC
Start: 2014-07-08 — End: 2014-07-08

## 2014-07-08 MED ORDER — NIACIN 250 MG PO TABS
250.0000 mg | ORAL_TABLET | Freq: Every day | ORAL | Status: DC
  Administered 2014-07-08: 250 mg via ORAL
  Filled 2014-07-08 (×3): qty 1

## 2014-07-08 MED ORDER — STROKE: EARLY STAGES OF RECOVERY BOOK
Freq: Once | Status: DC
  Filled 2014-07-08 (×2): qty 1

## 2014-07-08 NOTE — Consult Note (Signed)
Referring Physician: ED    Chief Complaint: TRANSIENT impairment of consciousness and dysarthria.   HPI:                                                                                                                                         Matthew Bright is an 80 y.o. 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 He recalls feeling very weak while in the kitchen preparing breakfast and decided to sit down and check his blood pressure. He remembers trying to reach for his BP equipment and then he went out and the last thing he recalls is being surrounded " by all these strangers". His wife noticed that after regaining consciousness he was slurring his words. Patient found by EMS hypotensive and reported near syncope and incontinent episode. Slurred speech was not present by the time of arrival to the ED. CT brain was unrevealing for acute abnormality At this time he feels back to baseline and denies HA, vertigo, double vision, difficulty swallowing, slurred speech, language or vision impairment. Date last known well: 07/08/14 Time last known well: uncertain tPA Given: no, symptoms resolved   Past Medical History  Diagnosis Date  . Hyperlipidemia   . Enlarged prostate   . Prostate cancer   . Exertional dyspnea   . Syncope and collapse  2008 and again in October 2012  . Hypertension   . Cerebellar stroke, acute 2008  . S/P cardiac cath Jan. 2007    Cath at [facility] several years ago with no significant CAD noted.   . OA (osteoarthritis)   . Varicose veins   . Carotid artery disease     per CTA but no significant stenosis  . Allergy   . Hernia 2006  . Special screening for malignant neoplasms, colon   . Vertigo 2008    Past  Surgical History  Procedure Laterality Date  . Prostate biopsy      X2  . Vein ligation and stripping      1953, 1968, 1992  . Hip surgery Bilateral 1991, 2001    right than left hip replacement  . Lumbar laminectomy    . Hemorrhoid surgery    . Cervical discectomy      ANTERIOR C5-C6  . Cardiovascular stress test  11/13/2007    EF 67%, NO ISCHEMIA   . Inguinal hernia repair Right 2007,2009  . Colonoscopy  2008    Dr. Sharlett Iles in [place]  . Eye surgery Bilateral 2010    cataract    Family History  Problem Relation Age of Onset  . Stroke Father   . Heart attack Father    Social History:  reports that he has never smoked. He  does not have any smokeless tobacco history on file. He reports that he does not drink alcohol or use illicit drugs.  Allergies:  Allergies  Allergen Reactions  . Avalide [Irbesartan-Hydrochlorothiazide] Other (See Comments)    Unknown  . Darvocet [Propoxyphene N-Acetaminophen] Other (See Comments)    Unknown  . Lovastatin     Unknown  . Zocor [Simvastatin]     Unknown    Medications:                                                                                                                           I have reviewed the patient's current medications.  ROS:                                                                                                                                       History obtained from the patient and chart review  General ROS: negative for - chills, fatigue, fever, night sweats, weight gain or weight loss Psychological ROS: negative for - behavioral disorder, hallucinations, memory difficulties, mood swings or suicidal ideation Ophthalmic ROS: negative for - blurry vision, double vision, eye pain or loss of vision ENT ROS: negative for - epistaxis, nasal discharge, oral lesions, sore throat, tinnitus or vertigo Allergy and Immunology ROS: negative for - hives or itchy/watery eyes Hematological and Lymphatic ROS:  negative for - bleeding problems, bruising or swollen lymph nodes Endocrine ROS: negative for - galactorrhea, hair pattern changes, polydipsia/polyuria or temperature intolerance Respiratory ROS: negative for - cough, hemoptysis, shortness of breath or wheezing Cardiovascular ROS: negative for - chest pain, dyspnea on exertion, edema or irregular heartbeat Gastrointestinal ROS: negative for - abdominal pain, diarrhea, hematemesis, nausea/vomiting or stool incontinence Genito-Urinary ROS: negative for - dysuria, hematuria, incontinence or urinary frequency/urgency Musculoskeletal ROS: negative for - joint swelling or muscular weakness Neurological ROS: as noted in HPI Dermatological ROS: negative for rash and skin lesion changes  Physical exam: pleasant male in no apparent distress.Blood pressure 129/69, temperature 98.1 F (36.7 C), temperature source Oral, resp. rate 16, height 5' 8"  (1.727 m), weight 68.04 kg (150 lb), SpO2 100.00%. Head: normocephalic. Neck: supple, no bruits, no JVD. Cardiac: no murmurs. Lungs: clear. Abdomen: soft, no tender, no mass. Extremities: no edema. Neurologic Examination:  Mental Status: Alert, oriented, thought content appropriate.  Speech fluent without evidence of aphasia.  Able to follow 3 step commands without difficulty. Cranial Nerves: II: Discs flat bilaterally; Visual fields grossly normal, pupils equal, round, reactive to light and accommodation III,IV, VI: ptosis not present, extra-ocular motions intact bilaterally V,VII: smile symmetric, facial light touch sensation normal bilaterally VIII: hearing normal bilaterally IX,X: gag reflex present XI: bilateral shoulder shrug XII: midline tongue extension without atrophy or fasciculations  Motor: Right : Upper extremity   5/5    Left:     Upper extremity   5/5  Lower extremity   5/5     Lower  extremity   5/5 Tone and bulk:normal tone throughout; no atrophy noted Sensory: Pinprick and light touch intact throughout, bilaterally Deep Tendon Reflexes:  Right: Upper Extremity   Left: Upper extremity   biceps (C-5 to C-6) 2/4   biceps (C-5 to C-6) 2/4 tricep (C7) 2/4    triceps (C7) 2/4 Brachioradialis (C6) 2/4  Brachioradialis (C6) 2/4  Lower Extremity Lower Extremity  quadriceps (L-2 to L-4) 2/4   quadriceps (L-2 to L-4) 2/4 Achilles (S1) 2/4   Achilles (S1) 2/4  Plantars: Right: downgoing   Left: downgoing Cerebellar: normal finger-to-nose,  normal heel-to-shin test Gait:  No tested.    Results for orders placed during the hospital encounter of 07/08/14 (from the past 48 hour(s))  URINE RAPID DRUG SCREEN (HOSP PERFORMED)     Status: None   Collection Time    07/08/14  9:56 AM      Result Value Ref Range   Opiates NONE DETECTED  NONE DETECTED   Cocaine NONE DETECTED  NONE DETECTED   Benzodiazepines NONE DETECTED  NONE DETECTED   Amphetamines NONE DETECTED  NONE DETECTED   Tetrahydrocannabinol NONE DETECTED  NONE DETECTED   Barbiturates NONE DETECTED  NONE DETECTED   Comment:            DRUG SCREEN FOR MEDICAL PURPOSES     ONLY.  IF CONFIRMATION IS NEEDED     FOR ANY PURPOSE, NOTIFY LAB     WITHIN 5 DAYS.                LOWEST DETECTABLE LIMITS     FOR URINE DRUG SCREEN     Drug Class       Cutoff (ng/mL)     Amphetamine      1000     Barbiturate      200     Benzodiazepine   510     Tricyclics       258     Opiates          300     Cocaine          300     THC              50  URINALYSIS, ROUTINE W REFLEX MICROSCOPIC     Status: None   Collection Time    07/08/14  9:56 AM      Result Value Ref Range   Color, Urine YELLOW  YELLOW   APPearance CLEAR  CLEAR   Specific Gravity, Urine 1.011  1.005 - 1.030   pH 7.5  5.0 - 8.0   Glucose, UA NEGATIVE  NEGATIVE mg/dL   Hgb urine dipstick NEGATIVE  NEGATIVE   Bilirubin Urine NEGATIVE  NEGATIVE   Ketones, ur  NEGATIVE  NEGATIVE mg/dL   Protein, ur NEGATIVE  NEGATIVE mg/dL  Urobilinogen, UA 0.2  0.0 - 1.0 mg/dL   Nitrite NEGATIVE  NEGATIVE   Leukocytes, UA NEGATIVE  NEGATIVE   Comment: MICROSCOPIC NOT DONE ON URINES WITH NEGATIVE PROTEIN, BLOOD, LEUKOCYTES, NITRITE, OR GLUCOSE <1000 mg/dL.  TROPONIN I     Status: None   Collection Time    07/08/14  9:56 AM      Result Value Ref Range   Troponin I <0.30  <0.30 ng/mL   Comment:            Due to the release kinetics of cTnI,     a negative result within the first hours     of the onset of symptoms does not rule out     myocardial infarction with certainty.     If myocardial infarction is still suspected,     repeat the test at appropriate intervals.  CBG MONITORING, ED     Status: None   Collection Time    07/08/14 11:01 AM      Result Value Ref Range   Glucose-Capillary 94  70 - 99 mg/dL  ETHANOL     Status: None   Collection Time    07/08/14 11:04 AM      Result Value Ref Range   Alcohol, Ethyl (B) <11  0 - 11 mg/dL   Comment:            LOWEST DETECTABLE LIMIT FOR     SERUM ALCOHOL IS 11 mg/dL     FOR MEDICAL PURPOSES ONLY  PROTIME-INR     Status: None   Collection Time    07/08/14 11:04 AM      Result Value Ref Range   Prothrombin Time 14.3  11.6 - 15.2 seconds   INR 1.11  0.00 - 1.49  APTT     Status: None   Collection Time    07/08/14 11:04 AM      Result Value Ref Range   aPTT 27  24 - 37 seconds  CBC     Status: None   Collection Time    07/08/14 11:04 AM      Result Value Ref Range   WBC 7.1  4.0 - 10.5 K/uL   RBC 4.39  4.22 - 5.81 MIL/uL   Hemoglobin 13.7  13.0 - 17.0 g/dL   HCT 40.8  39.0 - 52.0 %   MCV 92.9  78.0 - 100.0 fL   MCH 31.2  26.0 - 34.0 pg   MCHC 33.6  30.0 - 36.0 g/dL   RDW 13.9  11.5 - 15.5 %   Platelets 182  150 - 400 K/uL  DIFFERENTIAL     Status: None   Collection Time    07/08/14 11:04 AM      Result Value Ref Range   Neutrophils Relative % 77  43 - 77 %   Neutro Abs 5.4  1.7 - 7.7  K/uL   Lymphocytes Relative 14  12 - 46 %   Lymphs Abs 1.0  0.7 - 4.0 K/uL   Monocytes Relative 6  3 - 12 %   Monocytes Absolute 0.4  0.1 - 1.0 K/uL   Eosinophils Relative 2  0 - 5 %   Eosinophils Absolute 0.2  0.0 - 0.7 K/uL   Basophils Relative 1  0 - 1 %   Basophils Absolute 0.0  0.0 - 0.1 K/uL  COMPREHENSIVE METABOLIC PANEL     Status: Abnormal   Collection Time    07/08/14 11:04 AM  Result Value Ref Range   Sodium 139  137 - 147 mEq/L   Potassium 4.1  3.7 - 5.3 mEq/L   Chloride 101  96 - 112 mEq/L   CO2 27  19 - 32 mEq/L   Glucose, Bld 93  70 - 99 mg/dL   BUN 19  6 - 23 mg/dL   Creatinine, Ser 0.90  0.50 - 1.35 mg/dL   Calcium 8.8  8.4 - 10.5 mg/dL   Total Protein 7.0  6.0 - 8.3 g/dL   Albumin 3.8  3.5 - 5.2 g/dL   AST 34  0 - 37 U/L   ALT 23  0 - 53 U/L   Alkaline Phosphatase 59  39 - 117 U/L   Total Bilirubin 0.5  0.3 - 1.2 mg/dL   GFR calc non Af Amer 75 (*) >90 mL/min   GFR calc Af Amer 87 (*) >90 mL/min   Comment: (NOTE)     The eGFR has been calculated using the CKD EPI equation.     This calculation has not been validated in all clinical situations.     eGFR's persistently <90 mL/min signify possible Chronic Kidney     Disease.   Anion gap 11  5 - 15  I-STAT TROPOININ, ED     Status: None   Collection Time    07/08/14 11:12 AM      Result Value Ref Range   Troponin i, poc 0.04  0.00 - 0.08 ng/mL   Comment 3            Comment: Due to the release kinetics of cTnI,     a negative result within the first hours     of the onset of symptoms does not rule out     myocardial infarction with certainty.     If myocardial infarction is still suspected,     repeat the test at appropriate intervals.  I-STAT CG4 LACTIC ACID, ED     Status: None   Collection Time    07/08/14 11:14 AM      Result Value Ref Range   Lactic Acid, Venous 0.54  0.5 - 2.2 mmol/L   Dg Chest 2 View  07/08/2014   CLINICAL DATA:  Dizziness and weakness.  EXAM: CHEST  2 VIEW  COMPARISON:   Single view of the chest 11/25/2008 and 11/05/2007.  FINDINGS: Lungs are clear. Heart size is normal. No pneumothorax or pleural fluid.  IMPRESSION: No acute disease.   Electronically Signed   By: Inge Rise M.D.   On: 07/08/2014 10:35   Ct Head Wo Contrast  07/08/2014   CLINICAL DATA:  Syncopal episode.  EXAM: CT HEAD WITHOUT CONTRAST  TECHNIQUE: Contiguous axial images were obtained from the base of the skull through the vertex without intravenous contrast.  COMPARISON:  12/07/2011.  FINDINGS: Diffuse cerebral atrophy. Previously identified subdural hematoma on CT of 12/07/2011 has cleared. White matter changes consistent with a chronic ischemia noted. 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 Cerebral vascular calcification present. No acute bony abnormality. Mucosal thickening noted in the frontal, ethmoid, maxillary sinuses.  IMPRESSION: 1. No acute abnormality. Previously identified subdural hematoma has resolved. 2. Diffuse cerebral atrophy. 3. Chronic ischemic change. 4. Diffuse mucosal thickening of the paranasal sinuses consistent with chronic sinusitis.   Electronically Signed   By: Marcello Moores  Register   On: 07/08/2014 10:51    Assessment: 80 y.o. male with near syncopal episode versus TIA, although I feel more inclined to believe he really  had near syncope. Agree with TIA work up. Asa. Will follow up   Dorian Pod, MD [facility] [phone]  07/08/2014, 1:02 PM

## 2014-07-08 NOTE — Progress Notes (Signed)
PHARMACIST - PHYSICIAN ORDER COMMUNICATION  CONCERNING: P&T Medication Policy on Herbal Medications  DESCRIPTION:  This patient's order for:  Co-enzyme Q10 has been noted.  This product(s) is classified as an "herbal" or natural product. Due to a lack of definitive safety studies or FDA approval, nonstandard manufacturing practices, plus the potential risk of unknown drug-drug interactions while on inpatient medications, the Pharmacy and Therapeutics Committee does not permit the use of "herbal" or natural products of this type within [place].   ACTION TAKEN: The pharmacy department is unable to verify this order at this time and your patient has been informed of this safety policy. Please reevaluate patient's clinical condition at discharge and address if the herbal or natural product(s) should be resumed at that time.   

## 2014-07-08 NOTE — ED Notes (Signed)
[phone] Daughter [phone], Roel Cluck

## 2014-07-08 NOTE — ED Notes (Signed)
Pt here for approx 45 min episode of slurred speech now resolved, pt found by ems hypotensive and reported near syncope and incontinent episode, pt used urinal numerous times enroute to hospital. Given 500 ml bolus with ems and bp has now came to 140/81 per ems and hr from 110 to 86

## 2014-07-08 NOTE — ED Notes (Signed)
Pt taken to XRay 

## 2014-07-08 NOTE — H&P (Signed)
[facility] History and Physical  JEFFRIE LOFSTROM ZOX:[id number] DOB: 12-19-1926 DOA: 07/08/2014  Referring physician:  PCP: Carlyn Reichert, MD  Specialists: Dr. Mare Ferrari, Cardiology  Chief Complaint: Near syncope and aphasia  HPI: Matthew Bright is a 80 y.o. male  With a history of hypertension, hyperlipidemia, coronary artery disease, syncopal episodes in the past, presented to the emergency department with complaints of near syncope and aphasia. Per the daughter she was told by her mother the patient had slurred speech earlier today. This is questionable as the mother to have hearing problems. He slurred speech and did well with sulfa and 45 minutes. Patient was sitting at the table when he felt very weak and lightheaded. Patient did not lose consciousness but felt as if he was going to pass out. He his blood pressure was low. Patient has had similar episodes in the past, and recently his blood pressure medication has been changed to 2.5 mg daily. Patient does Dr. Mare Ferrari, cardiology. Patient did have an echocardiogram in May of 2015. At this time, patient no longer feels lightheaded but does complain of a headache. He denies any shortness of breath, dizziness, chest pain, abdominal pain. Patient denies any trauma or falls.  Review of Systems:  Constitutional: Patient feeling very fatigued. HEENT: Denies photophobia, eye pain, redness, hearing loss, ear pain, congestion, sore throat, rhinorrhea, sneezing, mouth sores, trouble swallowing, neck pain, neck stiffness and tinnitus.   Respiratory: Denies SOB, DOE, cough, chest tightness,  and wheezing.   Cardiovascular: Denies chest pain, palpitations and leg swelling.  Gastrointestinal: Denies nausea, vomiting, abdominal pain, diarrhea, constipation, blood in stool and abdominal distention.  Genitourinary: Complains of some urgency and frequency particularly at night.  Musculoskeletal: Denies myalgias, back pain, joint swelling,  arthralgias and gait problem.  Skin: Denies pallor, rash and wound.  Neurological: According to the daughter at bedside, the patient's wife stated he was having slurred speech which is now resolved. Patient does complain of generalized weakness. Hematological: Denies adenopathy. Easy bruising, personal or family bleeding history  Psychiatric/Behavioral: Denies suicidal ideation, mood changes, confusion, nervousness, sleep disturbance and agitation  Past Medical History  Diagnosis Date  . Hyperlipidemia   . Enlarged prostate   . Prostate cancer   . Exertional dyspnea   . Syncope and collapse  2008 and again in October 2012  . Hypertension   . Cerebellar stroke, acute 2008  . S/P cardiac cath Jan. 2007    Cath at [facility] several years ago with no significant CAD noted.   . OA (osteoarthritis)   . Varicose veins   . Carotid artery disease     per CTA but no significant stenosis  . Allergy   . Hernia 2006  . Special screening for malignant neoplasms, colon   . Vertigo 2008   Past Surgical History  Procedure Laterality Date  . Prostate biopsy      X2  . Vein ligation and stripping      1953, 1968, 1992  . Hip surgery Bilateral 1991, 2001    right than left hip replacement  . Lumbar laminectomy    . Hemorrhoid surgery    . Cervical discectomy      ANTERIOR C5-C6  . Cardiovascular stress test  11/13/2007    EF 67%, NO ISCHEMIA   . Inguinal hernia repair Right 2007,2009  . Colonoscopy  2008    Dr. Sharlett Iles in [place]  . Eye surgery Bilateral 2010    cataract   Social History:  reports that he  has never smoked. He does not have any smokeless tobacco history on file. He reports that he does not drink alcohol or use illicit drugs. The home, with his wife. Uses a cane as well as a walker for ambulation.  Allergies  Allergen Reactions  . Avalide [Irbesartan-Hydrochlorothiazide] Other (See Comments)    Unknown  . Darvocet [Propoxyphene N-Acetaminophen] Other (See Comments)     Unknown  . Lovastatin     Unknown  . Zocor [Simvastatin]     Unknown    Family History  Problem Relation Age of Onset  . Stroke Father   . Heart attack Father     Prior to Admission medications   Medication Sig Start Date End Date Taking? Authorizing Provider  B Complex-Biotin-FA (B COMPLEX 100 TR) TBCR Take 1 tablet by mouth daily.    Yes Historical Provider, MD  Coenzyme Q10 (COQ10 PO) Take 1 capsule by mouth 2 (two) times daily. Taking 200mg  daily   Yes Historical Provider, MD  lisinopril (PRINIVIL,ZESTRIL) 5 MG tablet Take 0.5 tablets (2.5 mg total) by mouth daily. 05/15/14  Yes Darlin Coco, MD  Multiple Vitamins-Minerals (CENTRUM PO) Take 1 tablet by mouth daily.    Yes Historical Provider, MD  niacinamide 500 MG tablet Take 250 mg by mouth daily.    Yes Historical Provider, MD  Nutritional Supplements (JOINT FORMULA PO) Take 2 tablets by mouth 2 (two) times daily.   Yes Historical Provider, MD  Probiotic Product (PROBIOTIC DAILY PO) Take 1 tablet by mouth daily.   Yes Historical Provider, MD   Physical Exam: Filed Vitals:   07/08/14 1145  BP:   Temp: 98.1 F (36.7 C)  Resp:      General: Well developed, well nourished, NAD, appears stated age  HEENT: NCAT, PERRLA, EOMI, Anicteic Sclera, mucous membranes moist.   Neck: Supple, no JVD, no masses  Cardiovascular: S1 S2 auscultated, 1/6 SEM, Regular rate and rhythm.  Respiratory: Clear to auscultation bilaterally with equal chest rise  Abdomen: Soft, nontender, nondistended, + bowel sounds  Extremities: warm dry without cyanosis clubbing. Trace edema in LE B/L.  Compression stockings in place.  Neuro: AAOx3, cranial nerves grossly intact. Strength 5/5 in patient's upper and lower extremities bilaterally  Skin: Without rashes exudates or nodules  Psych: Normal affect and demeanor with intact judgement and insight  Labs on Admission:  Basic Metabolic Panel:  Recent Labs Lab 07/08/14 1104  NA 139  K 4.1    CL 101  CO2 27  GLUCOSE 93  BUN 19  CREATININE 0.90  CALCIUM 8.8   Liver Function Tests:  Recent Labs Lab 07/08/14 1104  AST 34  ALT 23  ALKPHOS 59  BILITOT 0.5  PROT 7.0  ALBUMIN 3.8   No results found for this basename: LIPASE, AMYLASE,  in the last 168 hours No results found for this basename: AMMONIA,  in the last 168 hours CBC:  Recent Labs Lab 07/08/14 1104  WBC 7.1  NEUTROABS 5.4  HGB 13.7  HCT 40.8  MCV 92.9  PLT 182   Cardiac Enzymes:  Recent Labs Lab 07/08/14 0956  TROPONINI <0.30    BNP (last 3 results) No results found for this basename: PROBNP,  in the last 8760 hours CBG:  Recent Labs Lab 07/08/14 1101  GLUCAP 94    Radiological Exams on Admission: Dg Chest 2 View  07/08/2014   CLINICAL DATA:  Dizziness and weakness.  EXAM: CHEST  2 VIEW  COMPARISON:  Single view of the chest  11/25/2008 and 11/05/2007.  FINDINGS: Lungs are clear. Heart size is normal. No pneumothorax or pleural fluid.  IMPRESSION: No acute disease.   Electronically Signed   By: Inge Rise M.D.   On: 07/08/2014 10:35   Ct Head Wo Contrast  07/08/2014   CLINICAL DATA:  Syncopal episode.  EXAM: CT HEAD WITHOUT CONTRAST  TECHNIQUE: Contiguous axial images were obtained from the base of the skull through the vertex without intravenous contrast.  COMPARISON:  12/07/2011.  FINDINGS: Diffuse cerebral atrophy. Previously identified subdural hematoma on CT of 12/07/2011 has cleared. White matter changes consistent with a chronic ischemia noted. 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 Cerebral vascular calcification present. No acute bony abnormality. Mucosal thickening noted in the frontal, ethmoid, maxillary sinuses.  IMPRESSION: 1. No acute abnormality. Previously identified subdural hematoma has resolved. 2. Diffuse cerebral atrophy. 3. Chronic ischemic change. 4. Diffuse mucosal thickening of the paranasal sinuses consistent with chronic sinusitis.   Electronically  Signed   By: Marcello Moores  Register   On: 07/08/2014 10:51    EKG: Independently reviewed. Sinus rhythm, rate 74, left anterior fascicular block, old as compared to prior EKG  Assessment/Plan Active Problems:   TIA (transient ischemic attack)  Questionable slurred speech/rule out TIA -Patient will be admitted to telemetry for observation, will monitor for any tachycardia or bradycardia arrhythmias. -CT of the head: No acute abnormality. -Will obtain MRI/MRA of the head, conduct neuro checks every 4 hours, obtain lipid panel, hemoglobin A1c, carotid doppler.  Will also consult PT/OT.  -Neurology has been consulted by the emergency department. -Echocardiogram was done 04/19/2014 which showed an EF of 6065% with a grade 1 diastolic dysfunction. -Will place patient on aspirin 81 mg daily -It seems the patient has a statin allergy.  Near syncopal episode -Patient had syncope for years. He is followed by Dr. Mare Ferrari. -Will obtain carotid Doppler. -Will also obtain orthostatic vitals on admission. -Patient was hypotensive before coming to the emergency department, he was given fluids by EMS. -Currently he is normotensive. -Will hold blood pressure medications  Generalized weakness -Possibly secondary to the above, versus hypotension. Will also check TSH level, vitamin B 12, folate.  Hyperlipidemia -Will obtain lipid panel, patient does have a statin allergy. -Continue home medications: Coenzyme Q 10 and niacinamide.  Hypertension -Will hold patient's lisinopril at this time.  DVT prophylaxis: Lovenox  Code Status: Full  Condition: Guarded  Family Communication:  Daughter at bedside.mission, patients condition and plan of care including tests being ordered have been discussed with the patient and daughter who indicate understanding and agree with the plan and Code Status.  Disposition Plan:  admitted for observation   Time spent: 45 minutes  Lockie Bothun D.O. [facility] Pager [phone]  If 7PM-7AM, please contact night-coverage [email or web address] Password [facility] 07/08/2014, 12:43 PM

## 2014-07-08 NOTE — ED Provider Notes (Signed)
CSN: [id number]     Arrival date & time 07/08/14  2951 History   First MD Initiated Contact with Patient 07/08/14 [phone]     Chief Complaint  Patient presents with  . Near Syncope  . Aphasia     (Consider location/radiation/quality/duration/timing/severity/associated sxs/prior Treatment) HPI Comments: Patient is an 80 yo M PMHx significant for CAD, HTN, SLD, CVA, Prostate CA, vertigo presenting to the ED for near syncope just PTA with 45 minutes of slurred speech per wife that has resolved. Patient states he was sitting down in a chair when he felt very weak all over and lightheaded. He states he did not have any LOC. Patient is also complaining of urinary frequency, had an episode of incontinence with EMS. Noted to be hypotensive on EMS arrival, given 544mL bolus. Patient is not taking his 325mg  ASA regularly. Dr. Mare Ferrari is his cardiologist, last cath at [facility] 2007. Last echo 04/2014.  Patient is a 80 y.o. male presenting with near-syncope.  Near Syncope Associated symptoms include fatigue and weakness (generalized). Pertinent negatives include no abdominal pain, chest pain, chills, fever, nausea or vomiting.    Past Medical History  Diagnosis Date  . Hyperlipidemia   . Enlarged prostate   . Prostate cancer   . Exertional dyspnea   . Syncope and collapse  2008 and again in October 2012  . Hypertension   . Cerebellar stroke, acute 2008  . S/P cardiac cath Jan. 2007    Cath at [facility] several years ago with no significant CAD noted.   . OA (osteoarthritis)   . Varicose veins   . Carotid artery disease     per CTA but no significant stenosis  . Allergy   . Hernia 2006  . Special screening for malignant neoplasms, colon   . Vertigo 2008   Past Surgical History  Procedure Laterality Date  . Prostate biopsy      X2  . Vein ligation and stripping      1953, 1968, 1992  . Hip surgery Bilateral 1991, 2001    right than left hip replacement  . Lumbar laminectomy    . Hemorrhoid  surgery    . Cervical discectomy      ANTERIOR C5-C6  . Cardiovascular stress test  11/13/2007    EF 67%, NO ISCHEMIA   . Inguinal hernia repair Right 2007,2009  . Colonoscopy  2008    Dr. Sharlett Iles in [place]  . Eye surgery Bilateral 2010    cataract   Family History  Problem Relation Age of Onset  . Stroke Father   . Heart attack Father    History  Substance Use Topics  . Smoking status: Never Smoker   . Smokeless tobacco: Not on file  . Alcohol Use: No    Review of Systems  Constitutional: Positive for fatigue. Negative for fever and chills.  Respiratory: Negative for shortness of breath.   Cardiovascular: Positive for near-syncope. Negative for chest pain and palpitations.  Gastrointestinal: Negative for nausea, vomiting, abdominal pain and diarrhea.  Genitourinary: Positive for urgency and frequency.  Neurological: Positive for speech difficulty (resolved) and weakness (generalized). Negative for syncope.  All other systems reviewed and are negative.     Allergies  Avalide; Darvocet; Lovastatin; and Zocor  Home Medications   Prior to Admission medications   Medication Sig Start Date End Date Taking? Authorizing Provider  B Complex-Biotin-FA (B COMPLEX 100 TR) TBCR Take 1 tablet by mouth daily.    Yes Historical Provider, MD  Coenzyme Q10 (  COQ10 PO) Take 1 capsule by mouth 2 (two) times daily. Taking 200mg  daily   Yes Historical Provider, MD  lisinopril (PRINIVIL,ZESTRIL) 5 MG tablet Take 0.5 tablets (2.5 mg total) by mouth daily. 05/15/14  Yes Darlin Coco, MD  Multiple Vitamins-Minerals (CENTRUM PO) Take 1 tablet by mouth daily.    Yes Historical Provider, MD  niacinamide 500 MG tablet Take 250 mg by mouth daily.    Yes Historical Provider, MD  Nutritional Supplements (JOINT FORMULA PO) Take 2 tablets by mouth 2 (two) times daily.   Yes Historical Provider, MD  Probiotic Product (PROBIOTIC DAILY PO) Take 1 tablet by mouth daily.   Yes Historical Provider, MD    BP 129/69  Temp(Src) 98.1 F (36.7 C) (Oral)  Resp 16  Ht 5\' 8"  (1.727 m)  Wt 150 lb (68.04 kg)  BMI 22.81 kg/m2  SpO2 100% Physical Exam  Nursing note and vitals reviewed. Constitutional: He is oriented to person, place, and time. He appears well-developed and well-nourished. No distress.  HENT:  Head: Normocephalic and atraumatic.  Right Ear: External ear normal.  Left Ear: External ear normal.  Nose: Nose normal.  Mouth/Throat: Oropharynx is clear and moist. No oropharyngeal exudate.  Eyes: Conjunctivae and EOM are normal. Pupils are equal, round, and reactive to light.  Neck: Normal range of motion. Neck supple.  Cardiovascular: Normal rate, regular rhythm and intact distal pulses.   Murmur heard. Pulmonary/Chest: Effort normal and breath sounds normal. No respiratory distress.  Abdominal: Soft. There is no tenderness.  Musculoskeletal: He exhibits no edema.  Neurological: He is alert and oriented to person, place, and time. He has normal strength. No cranial nerve deficit. Gait normal. GCS eye subscore is 4. GCS verbal subscore is 5. GCS motor subscore is 6.  Sensation grossly intact.  No pronator drift.  Bilateral heel-knee-shin intact.  Skin: Skin is warm and dry. He is not diaphoretic.    ED Course  Procedures (including critical care time) Medications  acetaminophen (TYLENOL) tablet 650 mg (not administered)    Labs Review Labs Reviewed  COMPREHENSIVE METABOLIC PANEL - Abnormal; Notable for the following:    GFR calc non Af Amer 75 (*)    GFR calc Af Amer 87 (*)    All other components within normal limits  URINE CULTURE  ETHANOL  PROTIME-INR  APTT  CBC  DIFFERENTIAL  URINE RAPID DRUG SCREEN (HOSP PERFORMED)  URINALYSIS, ROUTINE W REFLEX MICROSCOPIC  TROPONIN I  I-STAT TROPOININ, ED  I-STAT CG4 LACTIC ACID, ED  CBG MONITORING, ED    Imaging Review Dg Chest 2 View  07/08/2014   CLINICAL DATA:  Dizziness and weakness.  EXAM: CHEST  2 VIEW   COMPARISON:  Single view of the chest 11/25/2008 and 11/05/2007.  FINDINGS: Lungs are clear. Heart size is normal. No pneumothorax or pleural fluid.  IMPRESSION: No acute disease.   Electronically Signed   By: Inge Rise M.D.   On: 07/08/2014 10:35   Ct Head Wo Contrast  07/08/2014   CLINICAL DATA:  Syncopal episode.  EXAM: CT HEAD WITHOUT CONTRAST  TECHNIQUE: Contiguous axial images were obtained from the base of the skull through the vertex without intravenous contrast.  COMPARISON:  12/07/2011.  FINDINGS: Diffuse cerebral atrophy. Previously identified subdural hematoma on CT of 12/07/2011 has cleared. White matter changes consistent with a chronic ischemia noted. 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 Cerebral vascular calcification present. No acute bony abnormality. Mucosal thickening noted in the frontal, ethmoid, maxillary sinuses.  IMPRESSION:  1. No acute abnormality. Previously identified subdural hematoma has resolved. 2. Diffuse cerebral atrophy. 3. Chronic ischemic change. 4. Diffuse mucosal thickening of the paranasal sinuses consistent with chronic sinusitis.   Electronically Signed   By: Marcello Moores  Register   On: 07/08/2014 10:51     EKG Interpretation None      MDM   Final diagnoses:  Transient cerebral ischemia, unspecified transient cerebral ischemia type  Near syncope    Filed Vitals:   07/08/14 1145  BP:   Temp: 98.1 F (36.7 C)  Resp:    Afebrile, NAD, non-toxic appearing, AAOx4. No neurofocal deficits on examination. I have reviewed nursing notes, vital signs, and all appropriate lab and imaging results for this patient.   1) TIA: Patient with 45 minutes of aphasia, back to baseline currently. Patient was not aphasic for EMS during hypotensive episode. CT scan negative. Labs reviewed. Discussed with Dr. Armida Sans who recommends inpatient evaluation for TIA work up.   2) Near syncope: Patient followed by Dr. Mare Ferrari. Last echo in 04/2014 wnl. History of  recurrent near syncope and syncope episodes. CXR, EKG, Troponin all reviewed w/o acute changes or abnormalities. Hypotension improved after fluids (510mL given via EMS) while in ED.   Patient will be admitted to the hospitalist for further management and evaluation. Patient d/w with Dr. Alvino Chapel, agrees with plan.    Harlow Mares, PA-C 07/08/14 1327

## 2014-07-08 NOTE — ED Notes (Signed)
Admitting MD at the bedside.  

## 2014-07-08 NOTE — ED Notes (Signed)
Family at bedside. 

## 2014-07-08 NOTE — ED Notes (Signed)
Patient transported to CT 

## 2014-07-09 ENCOUNTER — Observation Stay (HOSPITAL_COMMUNITY): Payer: Medicare Other

## 2014-07-09 DIAGNOSIS — R55 Syncope and collapse: Secondary | ICD-10-CM | POA: Diagnosis not present

## 2014-07-09 DIAGNOSIS — I10 Essential (primary) hypertension: Secondary | ICD-10-CM

## 2014-07-09 LAB — LIPID PANEL
Cholesterol: 156 mg/dL (ref 0–200)
HDL: 56 mg/dL (ref >39)
LDL CALC: 88 mg/dL (ref 0–99)
TRIGLYCERIDES: 60 mg/dL (ref <150)
Total CHOL/HDL Ratio: 2.8 RATIO
VLDL: 12 mg/dL (ref 0–40)

## 2014-07-09 LAB — URINE CULTURE

## 2014-07-09 LAB — GLUCOSE, CAPILLARY
GLUCOSE-CAPILLARY: 85 mg/dL (ref 70–99)
GLUCOSE-CAPILLARY: 133 mg/dL — AB (ref 70–99)
Glucose-Capillary: 96 mg/dL (ref 70–99)

## 2014-07-09 LAB — HEMOGLOBIN A1C
Hgb A1c MFr Bld: 5.8 % — ABNORMAL HIGH (ref <5.7)
Mean Plasma Glucose: 120 mg/dL — ABNORMAL HIGH (ref <117)

## 2014-07-09 MED ORDER — ASPIRIN 81 MG PO TBEC: 81.0000 mg | DELAYED_RELEASE_TABLET | Freq: Every day | ORAL | Status: DC

## 2014-07-09 MED ORDER — LISINOPRIL 2.5 MG PO TABS
2.5000 mg | ORAL_TABLET | Freq: Every day | ORAL | Status: DC
  Administered 2014-07-09: 2.5 mg via ORAL
  Filled 2014-07-09: qty 1

## 2014-07-09 NOTE — Discharge Instructions (Signed)
Syncope °Syncope is a medical term for fainting or passing out. This means you lose consciousness and drop to the ground. People are generally unconscious for less than 5 minutes. You may have some muscle twitches for up to 15 seconds before waking up and returning to normal. Syncope occurs more often in older adults, but it can happen to anyone. While most causes of syncope are not dangerous, syncope can be a sign of a serious medical problem. It is important to seek medical care.  °CAUSES  °Syncope is caused by a sudden drop in blood flow to the brain. The specific cause is often not determined. Factors that can bring on syncope include: °· Taking medicines that lower blood pressure. °· Sudden changes in posture, such as standing up quickly. °· Taking more medicine than prescribed. °· Standing in one place for too long. °· Seizure disorders. °· Dehydration and excessive exposure to heat. °· Low blood sugar (hypoglycemia). °· Straining to have a bowel movement. °· Heart disease, irregular heartbeat, or other circulatory problems. °· Fear, emotional distress, seeing blood, or severe pain. °SYMPTOMS  °Right before fainting, you may: °· Feel dizzy or light-headed. °· Feel nauseous. °· See all white or all black in your field of vision. °· Have cold, clammy skin. °DIAGNOSIS  °Your health care provider will ask about your symptoms, perform a physical exam, and perform an electrocardiogram (ECG) to record the electrical activity of your heart. Your health care provider may also perform other heart or blood tests to determine the cause of your syncope which may include: °· Transthoracic echocardiogram (TTE). During echocardiography, sound waves are used to evaluate how blood flows through your heart. °· Transesophageal echocardiogram (TEE). °· Cardiac monitoring. This allows your health care provider to monitor your heart rate and rhythm in real time. °· Holter monitor. This is a portable device that records your  heartbeat and can help diagnose heart arrhythmias. It allows your health care provider to track your heart activity for several days, if needed. °· Stress tests by exercise or by giving medicine that makes the heart beat faster. °TREATMENT  °In most cases, no treatment is needed. Depending on the cause of your syncope, your health care provider may recommend changing or stopping some of your medicines. °HOME CARE INSTRUCTIONS °· Have someone stay with you until you feel stable. °· Do not drive, use machinery, or play sports until your health care provider says it is okay. °· Keep all follow-up appointments as directed by your health care provider. °· Lie down right away if you start feeling like you might faint. Breathe deeply and steadily. Wait until all the symptoms have passed. °· Drink enough fluids to keep your urine clear or pale yellow. °· If you are taking blood pressure or heart medicine, get up slowly and take several minutes to sit and then stand. This can reduce dizziness. °SEEK IMMEDIATE MEDICAL CARE IF:  °· You have a severe headache. °· You have unusual pain in the chest, abdomen, or back. °· You are bleeding from your mouth or rectum, or you have black or tarry stool. °· You have an irregular or very fast heartbeat. °· You have pain with breathing. °· You have repeated fainting or seizure-like jerking during an episode. °· You faint when sitting or lying down. °· You have confusion. °· You have trouble walking. °· You have severe weakness. °· You have vision problems. °If you fainted, call your local emergency services (911 in [place] Do not drive   yourself to the hospital.  MAKE SURE YOU:  Understand these instructions.  Will watch your condition.  Will get help right away if you are not doing well or get worse. Document Released: 12/06/2005 Document Revised: 12/11/2013 Document Reviewed: 02/04/2012 [facility] Patient Information 2015 [place], [place]. This information is not intended to replace  advice given to you by your health care provider. Make sure you discuss any questions you have with your health care provider. Transient Ischemic Attack A transient ischemic attack (TIA) is a "warning stroke" that causes stroke-like symptoms. Unlike a stroke, a TIA does not cause permanent damage to the brain. The symptoms of a TIA can happen very fast and do not last long. It is important to know the symptoms of a TIA and what to do. This can help prevent a major stroke or death. CAUSES   A TIA is caused by a temporary blockage in an artery in the brain or neck (carotid artery). The blockage does not allow the brain to get the blood supply it needs and can cause different symptoms. The blockage can be caused by either:  A blood clot.  Fatty buildup (plaque) in a neck or brain artery. RISK FACTORS  High blood pressure (hypertension).  High cholesterol.  Diabetes mellitus.  Heart disease.  The build up of plaque in the blood vessels (peripheral artery disease or atherosclerosis).  The build up of plaque in the blood vessels providing blood and oxygen to the brain (carotid artery stenosis).  An abnormal heart rhythm (atrial fibrillation).  Obesity.  Smoking.  Taking oral contraceptives (especially in combination with smoking).  Physical inactivity.  A diet high in fats, salt (sodium), and calories.  Alcohol use.  Use of illegal drugs (especially cocaine and methamphetamine).  Being male.  Being African American.  Being over the age of 32.  Family history of stroke.  Previous history of blood clots, stroke, TIA, or heart attack.  Sickle cell disease. SYMPTOMS  TIA symptoms are the same as a stroke but are temporary. These symptoms usually develop suddenly, or may be newly present upon awakening from sleep:  Sudden weakness or numbness of the face, arm, or leg, especially on one side of the body.  Sudden trouble walking or difficulty moving arms or legs.  Sudden  confusion.  Sudden personality changes.  Trouble speaking (aphasia) or understanding.  Difficulty swallowing.  Sudden trouble seeing in one or both eyes.  Double vision.  Dizziness.  Loss of balance or coordination.  Sudden severe headache with no known cause.  Trouble reading or writing.  Loss of bowel or bladder control.  Loss of consciousness. DIAGNOSIS  Your caregiver may be able to determine the presence or absence of a TIA based on your symptoms, history, and physical exam. Computed tomography (CT scan) of the brain is usually performed to help identify a TIA. Other tests may be done to diagnose a TIA. These tests may include:  Electrocardiography.  Continuous heart monitoring.  Echocardiography.  Carotid ultrasonography.  Magnetic resonance imaging (MRI).  A scan of the brain circulation.  Blood tests. PREVENTION  The risk of a TIA can be decreased by appropriately treating high blood pressure, high cholesterol, diabetes, heart disease, and obesity and by quitting smoking, limiting alcohol, and staying physically active. TREATMENT  Time is of the essence. Since the symptoms of TIA are the same as a stroke, it is important to seek treatment as soon as possible because you may need a medicine to dissolve the clot (thrombolytic)  that cannot be given if too much time has passed. Treatment options vary. Treatment options may include rest, oxygen, intravenous (IV) fluids, and medicines to thin the blood (anticoagulants). Medicines and diet may be used to address diabetes, high blood pressure, and other risk factors. Measures will be taken to prevent short-term and long-term complications, including infection from breathing foreign material into the lungs (aspiration pneumonia), blood clots in the legs, and falls. Treatment options include procedures to either remove plaque in the carotid arteries or dilate carotid arteries that have narrowed due to plaque. Those procedures  are:  Carotid endarterectomy.  Carotid angioplasty and stenting. HOME CARE INSTRUCTIONS   Take all medicines prescribed by your caregiver. Follow the directions carefully. Medicines may be used to control risk factors for a stroke. Be sure you understand all your medicine instructions.  You may be told to take aspirin or the anticoagulant warfarin. Warfarin needs to be taken exactly as instructed.  Taking too much or too little warfarin is dangerous. Too much warfarin increases the risk of bleeding. Too little warfarin continues to allow the risk for blood clots. While taking warfarin, you will need to have regular blood tests to measure your blood clotting time. A PT blood test measures how long it takes for blood to clot. Your PT is used to calculate another value called an INR. Your PT and INR help your caregiver to adjust your dose of warfarin. The dose can change for many reasons. It is critically important that you take warfarin exactly as prescribed.  Many foods, especially foods high in vitamin K can interfere with warfarin and affect the PT and INR. Foods high in vitamin K include spinach, kale, broccoli, cabbage, collard and turnip greens, brussels sprouts, peas, cauliflower, seaweed, and parsley as well as beef and pork liver, green tea, and soybean oil. You should eat a consistent amount of foods high in vitamin K. Avoid major changes in your diet, or notify your caregiver before changing your diet. Arrange a visit with a dietitian to answer your questions.  Many medicines can interfere with warfarin and affect the PT and INR. You must tell your caregiver about any and all medicines you take, this includes all vitamins and supplements. Be especially cautious with aspirin and anti-inflammatory medicines. Do not take or discontinue any prescribed or over-the-counter medicine except on the advice of your caregiver or pharmacist.  Warfarin can have side effects, such as excessive bruising or  bleeding. You will need to hold pressure over cuts for longer than usual. Your caregiver or pharmacist will discuss other potential side effects.  Avoid sports or activities that may cause injury or bleeding.  Be mindful when shaving, flossing your teeth, or handling sharp objects.  Alcohol can change the body's ability to handle warfarin. It is best to avoid alcoholic drinks or consume only very small amounts while taking warfarin. Notify your caregiver if you change your alcohol intake.  Notify your dentist or other caregivers before procedures.  Eat a diet that includes 5 or more servings of fruits and vegetables each day. This may reduce the risk of stroke. Certain diets may be prescribed to address high blood pressure, high cholesterol, diabetes, or obesity.  A low-sodium, low-saturated fat, low-trans fat, low-cholesterol diet is recommended to manage high blood pressure.  A low-saturated fat, low-trans fat, low-cholesterol, and high-fiber diet may control cholesterol levels.  A controlled-carbohydrate, controlled-sugar diet is recommended to manage diabetes.  A reduced-calorie, low-sodium, low-saturated fat, low-trans fat, low-cholesterol diet is  recommended to manage obesity.  Maintain a healthy weight.  Stay physically active. It is recommended that you get at least 30 minutes of activity on most or all days.  Do not smoke.  Limit alcohol use even if you are not taking warfarin. Moderate alcohol use is considered to be:  No more than 2 drinks each day for men.  No more than 1 drink each day for nonpregnant women.  Stop drug abuse.  Home safety. A safe home environment is important to reduce the risk of falls. Your caregiver may arrange for specialists to evaluate your home. Having grab bars in the bedroom and bathroom is often important. Your caregiver may arrange for equipment to be used at home, such as raised toilets and a seat for the shower.  Follow all instructions  for follow-up with your caregiver. This is very important. This includes any referrals and lab tests. Proper follow up can prevent a stroke or another TIA from occurring. SEEK MEDICAL CARE IF:  You have personality changes.  You have difficulty swallowing.  You are seeing double.  You have dizziness.  You have a fever.  You have skin breakdown. SEEK IMMEDIATE MEDICAL CARE IF:  Any of these symptoms may represent a serious problem that is an emergency. Do not wait to see if the symptoms will go away. Get medical help right away. Call your local emergency services (911 in [place] Do not drive yourself to the hospital.  You have sudden weakness or numbness of the face, arm, or leg, especially on one side of the body.  You have sudden trouble walking or difficulty moving arms or legs.  You have sudden confusion.  You have trouble speaking (aphasia) or understanding.  You have sudden trouble seeing in one or both eyes.  You have a loss of balance or coordination.  You have a sudden, severe headache with no known cause.  You have new chest pain or an irregular heartbeat.  You have a partial or total loss of consciousness. MAKE SURE YOU:   Understand these instructions.  Will watch your condition.  Will get help right away if you are not doing well or get worse. Document Released: 09/15/2005 Document Revised: 12/11/2013 Document Reviewed: 01/29/2010 [facility] Patient Information 2015 [place], [place]. This information is not intended to replace advice given to you by your health care provider. Make sure you discuss any questions you have with your health care provider.

## 2014-07-09 NOTE — Discharge Summary (Signed)
Physician Discharge Summary  [name] TTS:[id number] DOB: 1926/02/13 DOA: 07/08/2014  PCP: Carlyn Reichert, MD  Admit date: 07/08/2014 Discharge date: 07/09/2014  Time spent: 30 minutes  Recommendations for Outpatient Follow-up:  Patient will be discharged to home.  He is to follow up with his primary care physician within one week of discharge as well as neurosurgery in 2-3 weeks. Patient should continue his medications as prescribed. Patient should resume normal physical activity as tolerated. He should follow a heart healthy diet.   Discharge Diagnoses:  Questionable dysarthria/TIA Near syncopal episode Generalized weakness Hyperlipidemia Hypertension  Discharge Condition: Stable  Diet recommendation: Heart healthy  Filed Weights   07/08/14 0942  Weight: 68.04 kg (150 lb)    History of present illness:  On 07/08/2014 Matthew Bright is a 80 y.o. male with a history of hypertension, hyperlipidemia, coronary artery disease, syncopal episodes in the past, presented to the emergency department with complaints of near syncope and aphasia. Per the daughter, she was told by her mother the patient had slurred speech. This was questionable as the mother has hearing problems which requires hearing aids. His slurred speech lasted 45 minutes. Patient was sitting at the table when he felt very weak and lightheaded. Patient did not lose consciousness but felt as if he was going to pass out. He thought his blood pressure was low. Patient has had similar episodes in the past, and recently his blood pressure medication has been changed to 2.5 mg daily. Patient sees Dr. Mare Ferrari, cardiology. Patient did have an echocardiogram in May of 2015. At the time of admission, patient no longer felt lightheaded but did complain of a headache. He denied any shortness of breath, dizziness, chest pain, abdominal pain. Patient denied any trauma or falls.  Hospital Course:  Questionable slurred  speech/dysarthria/ TIA  -CT of the head: No acute abnormality.  -MRI of the brain: No acute infarct -MRA: C1-C2 degenerative changes with spinal stenosis and cord compression which has progressed slightly -Spoke with Dr. Trenton Gammon, neurosurgery, patient can be seen as an outpatient for these findings -Carotid doppler: Bilateral: 1-39% ICA stenosis. Vertebral artery flow is antegrade.  -LDL 88, HbA1c 5.8 -PT and OT consulted.  PT recommended home health, patient refused.  -Neurology consulted and recommended -Echocardiogram was done 04/19/2014 which showed an EF of 6065% with a grade 1 diastolic dysfunction.  -Continue Aspirin 81mg  daily, not a statin due to allergy -Patient will need to follow up with neurology as an outpatient   Near syncopal episode  -Patient had syncope for years. He is followed by Dr. Mare Ferrari.  -Carotid doppler -may have been secondary to hypotension, as he was hypotensive prior to ED arrival   Generalized weakness  -Possibly secondary to the above, versus hypotension.  -Currently resolved. -TSH 23  Hyperlipidemia  -Lipid panel: Cholesterol 56, triglycerides 60, HDL 56, LDL 80 -Continue home medications: Coenzyme Q 10 and niacinamide.   Hypertension  -May resume lisinopril  Procedures: Carotid Doppler: Bilateral: 1-39% ICA stenosis. Vertebral artery flow is antegrade.   Consultations: Neurology Neurosurgery, Dr. Trenton Gammon, via phone  Discharge Exam: Filed Vitals:   07/09/14 1440  BP: 148/70  Pulse: 71  Temp: 98.2 F (36.8 C)  Resp: 20   General: Well developed, well nourished, NAD, appears stated age  HEENT: NCAT, mucous membranes moist.  Cardiovascular: S1 S2 auscultated, 1/6 SEM, Regular rate and rhythm.  Respiratory: Clear to auscultation bilaterally with equal chest rise  Abdomen: Soft, nontender, nondistended, + bowel sounds  Extremities: warm  dry without cyanosis clubbing. Trace edema in LE B/L. Compression stockings in place.  Neuro: AAOx3, no  focal deficits Psych: Normal affect and demeanor with intact judgement and insight  Discharge Instructions      Discharge Instructions   Diet - low sodium heart healthy    Complete by:  As directed      Discharge instructions    Complete by:  As directed   Patient will be discharged to home.  He is to follow up with his primary care physician within one week of discharge as well as neurology within 2-3 weeks of discharge. Patient should continue his medications as prescribed. Patient should resume normal physical activity as tolerated. He should follow a heart healthy diet.     Increase activity slowly    Complete by:  As directed             Medication List         aspirin 81 MG EC tablet  Take 1 tablet (81 mg total) by mouth daily.     B COMPLEX 100 TR Tbcr  Take 1 tablet by mouth daily.     CENTRUM PO  Take 1 tablet by mouth daily.     COQ10 PO  Take 1 capsule by mouth 2 (two) times daily. Taking 200mg  daily     JOINT FORMULA PO  Take 2 tablets by mouth 2 (two) times daily.     lisinopril 5 MG tablet  Commonly known as:  PRINIVIL,ZESTRIL  Take 0.5 tablets (2.5 mg total) by mouth daily.     niacinamide 500 MG tablet  Take 250 mg by mouth daily.     PROBIOTIC DAILY PO  Take 1 tablet by mouth daily.       Allergies  Allergen Reactions  . Avalide [Irbesartan-Hydrochlorothiazide] Other (See Comments)    Unknown  . Darvocet [Propoxyphene N-Acetaminophen] Other (See Comments)    Unknown  . Lovastatin     Unknown  . Zocor [Simvastatin]     Unknown   Follow-up Information   Follow up with Carlyn Reichert, MD. Schedule an appointment as soon as possible for a visit in 1 week. [facility] followup)    Specialty:  Family Medicine   Contact information:   [address] [place] [address] [phone]       Follow up with Darlin Coco, MD. (As needed for syncope)    Specialty:  Cardiology   Contact information:   [address] [place] [address] [phone]       Follow up with POOL,HENRY A, MD. Schedule an appointment as soon as possible for a visit in 1 week. (Follow up/new patient for MRI findings of C1/C2)    Specialty:  Neurosurgery   Contact information:   [address] [place] [address] [phone]        The results of significant diagnostics from this hospitalization (including imaging, microbiology, ancillary and laboratory) are listed below for reference.    Significant Diagnostic Studies: Dg Chest 2 View  07/08/2014   CLINICAL DATA:  Dizziness and weakness.  EXAM: CHEST  2 VIEW  COMPARISON:  Single view of the chest 11/25/2008 and 11/05/2007.  FINDINGS: Lungs are clear. Heart size is normal. No pneumothorax or pleural fluid.  IMPRESSION: No acute disease.   Electronically Signed   By: Inge Rise M.D.   On: 07/08/2014 10:35   Ct Head Wo Contrast  07/08/2014   CLINICAL DATA:  Syncopal episode.  EXAM: CT HEAD WITHOUT CONTRAST  TECHNIQUE: Contiguous axial images were obtained from the base of the skull through the vertex without intravenous contrast.  COMPARISON:  12/07/2011.  FINDINGS: Diffuse cerebral atrophy. Previously identified subdural hematoma on CT of 12/07/2011 has cleared. White matter changes consistent with a chronic ischemia noted. 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 Cerebral vascular calcification present. No acute bony abnormality. Mucosal thickening noted in the frontal, ethmoid, maxillary sinuses.  IMPRESSION: 1. No acute abnormality. Previously identified subdural hematoma has resolved. 2. Diffuse cerebral atrophy. 3. Chronic ischemic change. 4. Diffuse mucosal thickening of the paranasal sinuses consistent with chronic sinusitis.   Electronically Signed   By: Marcello Moores  Register   On: 07/08/2014 10:51    Microbiology: Recent Results (from the past 240 hour(s))  URINE CULTURE     Status: None   Collection Time    07/08/14  9:57 AM       Result Value Ref Range Status   Specimen Description URINE, CLEAN CATCH   Final   Special Requests NONE   Final   Culture  Setup Time     Final   Value: 07/08/2014 14:37     Performed at [facility]     Final   Value: 20,OOO COLONIES/ML     Performed at [facility]   Culture     Final   Value: Multiple bacterial morphotypes present, none predominant. Suggest appropriate recollection if clinically indicated.     Performed at [facility]   Report Status 07/09/2014 FINAL   Final     Labs: Basic Metabolic Panel:  Recent Labs Lab 07/08/14 1104 07/08/14 1520  NA 139  --   K 4.1  --   CL 101  --   CO2 27  --   GLUCOSE 93  --   BUN 19  --   CREATININE 0.90 0.86  CALCIUM 8.8  --    Liver Function Tests:  Recent Labs Lab 07/08/14 1104  AST 34  ALT 23  ALKPHOS 59  BILITOT 0.5  PROT 7.0  ALBUMIN 3.8   No results found for this basename: LIPASE, AMYLASE,  in the last 168 hours No results found for this basename: AMMONIA,  in the last 168 hours CBC:  Recent Labs Lab 07/08/14 1104 07/08/14 1520  WBC 7.1 7.0  NEUTROABS 5.4  --   HGB 13.7 13.0  HCT 40.8 38.9*  MCV 92.9 91.5  PLT 182 199   Cardiac Enzymes:  Recent Labs Lab 07/08/14 0956  TROPONINI <0.30   BNP: BNP (last 3 results) No results found for this basename: PROBNP,  in the last 8760 hours CBG:  Recent Labs Lab 07/08/14 1618 07/08/14 2050 07/09/14 0658 07/09/14 1137 07/09/14 1608  GLUCAP 235* 121* 85 96 133*       Signed:  Onita Pfluger  [facility] 07/09/2014, 4:38 PM

## 2014-07-09 NOTE — Evaluation (Signed)
Physical Therapy Evaluation Patient Details Name: ANTHONEY SHEPPARD MRN: [id number] DOB: 12/02/1926 Today's Date: 07/09/2014   History of Present Illness  Pt admitted 7/20 with syncopal episode reported by daughter. Stroke work up pending.  Clinical Impression  Pt with noted impaired balance and generalized weakness/deconditioning. However suspect pt near baseline. Instructed for patient not to go downstairs for a little due to increased falls risk. Pt functioning at supervision level with exception of stair negotiation. Pt to benefit from HHPT however pt deferred and also reports he used up all his outpt PT benefits.    Follow Up Recommendations Supervision/Assistance - 24 hour (pt deferred HHPT, used all outpt benefits already)    Equipment Recommendations  None recommended by PT    Recommendations for Other Services       Precautions / Restrictions Precautions Precautions: Fall Restrictions Weight Bearing Restrictions: No      Mobility  Bed Mobility Overal bed mobility: Needs Assistance Bed Mobility: Supine to Sit     Supine to sit: Supervision     General bed mobility comments: increased time  Transfers Overall transfer level: Needs assistance Equipment used: Straight cane Transfers: Sit to/from Stand Sit to Stand: Min guard         General transfer comment: increased time  Ambulation/Gait Ambulation/Gait assistance: Min guard Ambulation Distance (Feet): 100 Feet (x2) Assistive device: Straight cane Gait Pattern/deviations: Step-through pattern;Decreased stride length Gait velocity: slow Gait velocity interpretation: Below normal speed for age/gender General Gait Details: pt at significant decreased pace. pt with inconsistent sequencing of cane but no episodes of LOB. pt with significant trunk flex  Stairs Stairs: Yes Stairs assistance: Min guard Stair Management: One rail Left;With cane Number of Stairs: 10 General stair comments: max v/c's for safe  technique utilizing step to pattern. pt pulling self up and not using cane properely attempting to use alternating gait pattern. pt with increased stability when asc/desc with step to gait pattern  Wheelchair Mobility    Modified Rankin (Stroke Patients Only)       Balance Overall balance assessment: Needs assistance Sitting-balance support: Feet supported Sitting balance-Leahy Scale: Good     Standing balance support: Single extremity supported Standing balance-Leahy Scale: Fair Standing balance comment: requires single UE support                             Pertinent Vitals/Pain Denies pain    Home Living Family/patient expects to be discharged to:: Private residence Living Arrangements: Spouse/significant other Available Help at Discharge: Family;Available 24 hours/day Type of Home: House Home Access: Stairs to enter Entrance Stairs-Rails: Left Entrance Stairs-Number of Steps: 7 Home Layout: One level;Laundry or work area in [place]: Environmental consultant - 2 wheels;Shower seat;Cane - single point Additional Comments: pt reports having 2 neices who help with driving them around and to grocery store. they have a friend who takes them to/from MD appoints    Prior Function Level of Independence: Independent with assistive device(s)         Comments: cane     Hand Dominance   Dominant Hand: Right    Extremity/Trunk Assessment   Upper Extremity Assessment: Generalized weakness           Lower Extremity Assessment: Generalized weakness      Cervical / Trunk Assessment: Kyphotic (significant back flexion)  Communication   Communication: No difficulties  Cognition Arousal/Alertness: Awake/alert Behavior During Therapy: WFL for tasks assessed/performed Overall Cognitive Status: Within Functional  Limits for tasks assessed                      General Comments      Exercises        Assessment/Plan    PT Assessment Patient  needs continued PT services  PT Diagnosis Difficulty walking   PT Problem List Decreased strength;Decreased activity tolerance;Decreased balance;Decreased mobility  PT Treatment Interventions DME instruction;Gait training;Stair training;Functional mobility training;Therapeutic activities;Therapeutic exercise;Balance training;Neuromuscular re-education   PT Goals (Current goals can be found in the Care Plan section) Acute Rehab PT Goals PT Goal Formulation: With patient Time For Goal Achievement: 07/16/14 Potential to Achieve Goals: Good    Frequency Min 3X/week   Barriers to discharge        Co-evaluation               End of Session Equipment Utilized During Treatment: Gait belt Activity Tolerance: Patient tolerated treatment well Patient left: in chair;with call bell/phone within reach Nurse Communication: Mobility status    Functional Assessment Tool Used: clinical judgement Functional Limitation: Mobility: Walking and moving around Mobility: Walking and Moving Around Current Status (K8127): At least 20 percent but less than 40 percent impaired, limited or restricted Mobility: Walking and Moving Around Goal Status [phone]): At least 1 percent but less than 20 percent impaired, limited or restricted    Time: 1500-1533 PT Time Calculation (min): 33 min   Charges:   PT Evaluation $Initial PT Evaluation Tier I: 1 Procedure PT Treatments $Gait Training: 8-22 mins   PT G Codes:   Functional Assessment Tool Used: clinical judgement Functional Limitation: Mobility: Walking and moving around    [place], [facility] 07/09/2014, 3:46 PM  Kittie Plater, PT, DPT Pager #: [phone] Office #: [phone]

## 2014-07-09 NOTE — Progress Notes (Signed)
Nutrition Brief Note  Patient identified on the Malnutrition Screening Tool (MST) Report  Wt Readings from Last 15 Encounters:  07/08/14 150 lb (68.04 kg)  04/05/14 151 lb (68.493 kg)  10/04/13 158 lb (71.668 kg)  08/16/13 156 lb (70.761 kg)  04/02/13 167 lb 3.2 oz (75.841 kg)  10/03/12 157 lb 1.9 oz (71.269 kg)  04/05/12 164 lb (74.39 kg)  02/11/12 166 lb (75.297 kg)  01/12/12 163 lb (73.936 kg)  12/24/11 161 lb (73.029 kg)  12/06/11 160 lb 4.4 oz (72.7 kg)  11/30/11 163 lb 1.9 oz (73.991 kg)  10/22/11 165 lb (74.844 kg)    Body mass index is 22.81 kg/(m^2). Patient meets criteria for Normal Weight based on current BMI. Pt reports losing 20 lbs a couple years ago; he has been maintaining his weight around 150 lbs for the past year. He states his appetite is good and he eats 3 meals daily. He complains of weakness and fatigue and is interested in trying Ensure or Boost when he gets home- RD provided coupons.   Current diet order is Heart healthy, patient is consuming approximately 100% of meals at this time. Labs and medications reviewed.   No nutrition interventions warranted at this time. If nutrition issues arise, please consult RD.   Pryor Ochoa RD, LDN Inpatient Clinical Dietitian Pager: [phone] After Hours Pager: [phone]

## 2014-07-09 NOTE — Progress Notes (Signed)
VASCULAR LAB PRELIMINARY  PRELIMINARY  PRELIMINARY  PRELIMINARY  Carotid duplex  completed.    Preliminary report:  Bilateral:  1-39% ICA stenosis.  Vertebral artery flow is antegrade.      Dionel Archey, RVT 07/09/2014, 11:53 AM

## 2014-07-09 NOTE — Evaluation (Addendum)
Occupational Therapy Evaluation Patient Details Name: Matthew Bright MRN: [id number] DOB: 1926/12/14 Today's Date: 07/09/2014    History of Present Illness Pt admitted 7/20 with syncopal episode reported by daughter. MRI revealed no acute infarct- remote right cerebellar infarct.   Clinical Impression   Pt admitted with above. Education provided during session. Pt planning to d/c today, so no goals set. Deferring all further needs to [facility].    Follow Up Recommendations  Home health OT;Supervision/Assistance - 24 hour    Equipment Recommendations  3 in 1 bedside comode    Recommendations for Other Services       Precautions / Restrictions Precautions Precautions: Fall Restrictions Weight Bearing Restrictions: No      Mobility Bed Mobility      General bed mobility comments: not assessed  Transfers Overall transfer level: Needs assistance Equipment used: Straight cane Transfers: Sit to/from Stand Sit to Stand: Min guard         General transfer comment: Min guard for safety         ADL Overall ADL's : Needs assistance/impaired                     Lower Body Dressing: Min guard;Sit to/from stand Lower Body Dressing Details (indicate cue type and reason):  (cues to sit down) Toilet Transfer: Min guard;Ambulation (bed; used cane)       Tub/ Shower Transfer: Minimal assistance;Ambulation (cane; OT provided HHA-pt uses wall to step in at home)   Functional mobility during ADLs: Min guard;Cane General ADL Comments: Pt practiced shower transfer- OT acticed as simulated wall and provided HHA as pt stepped in and out of simulated shower. Recommended someone be with him for this. Recommended sitting for most of LB ADLs. Educated on signs/symptoms of stroke and to get help right away. Educated on safety tips for home such as safe shoewear and using chair for LB bathing.     Vision                     Perception     Praxis      Pertinent  Vitals/Pain No apparent distress.     Hand Dominance Right   Extremity/Trunk Assessment Upper Extremity Assessment Upper Extremity Assessment: Generalized weakness-reports it hurts when he raises his arms and also reports difficulty with fasteners    Lower Extremity Assessment Lower Extremity Assessment: Defer to PT evaluation   Cervical / Trunk Assessment Cervical / Trunk Assessment: Kyphotic   Communication Communication Communication: No difficulties   Cognition Arousal/Alertness: Awake/alert Behavior During Therapy: WFL for tasks assessed/performed Overall Cognitive Status: Within Functional Limits for tasks assessed                     General Comments       Exercises       Shoulder Instructions      Home Living Family/patient expects to be discharged to:: Private residence Living Arrangements: Spouse/significant other Available Help at Discharge: Family;Available 24 hours/day Type of Home: House Home Access: Stairs to enter [employer] of Steps: 7 Entrance Stairs-Rails: Left Home Layout: One level;Laundry or work area in [place] Shower/Tub: Occupational psychologist: [place]: Environmental consultant - 2 wheels;Cane - single point Sports administrator he says cane be used as Civil engineer, contracting)   Additional Comments: pt reports having 2 neices who help with driving them around and to grocery store. they  have a friend who takes them to/from MD appoints      Prior Functioning/Environment Level of Independence: Independent with assistive device(s)        Comments: cane    OT Diagnosis: Generalized weakness   OT Problem List: Decreased strength;Decreased range of motion;Decreased knowledge of use of DME or AE;Decreased knowledge of precautions;Decreased safety awareness;Impaired balance (sitting and/or standing)   OT Treatment/Interventions:      OT Goals(Current goals can be found in the care plan section)    OT  Frequency:     Barriers to D/C:            Co-evaluation              End of Session Equipment Utilized During Treatment: Gait belt;Other (comment) (cane) Nurse Communication: Other (comment) (Recommending HHOT/3 in 1)  Activity Tolerance: Patient tolerated treatment well Patient left: in bed;with call bell/phone within reach   Time: 1705-1730 OT Time Calculation (min): 25 min Charges:  OT General Charges $OT Visit: 1 Procedure OT Evaluation $Initial OT Evaluation Tier I: 1 Procedure OT Treatments $Self Care/Home Management : 8-22 mins G-Codes: OT G-codes **NOT FOR INPATIENT CLASS** Functional Assessment Tool Used: clinical judgment Functional Limitation: Self care Self Care Current Status (E3662): At least 1 percent but less than 20 percent impaired, limited or restricted Self Care Goal Status (H4765): At least 1 percent but less than 20 percent impaired, limited or restricted Self Care Discharge Status [phone]): At least 1 percent but less than 20 percent impaired, limited or restricted  [name] OTR/L [phone] 07/09/2014, 6:32 PM

## 2014-07-09 NOTE — Progress Notes (Signed)
Discharge instructions given. Pt verbalized understanding and all questions were answered. PT/OT recommended home health and bedside commode, pt refused. Dr. Ree Kida aware.

## 2014-07-09 NOTE — Progress Notes (Signed)
Subjective: No complaints.  No further episodes.   Objective: Current vital signs: BP 175/91  Pulse 85  Temp(Src) 97.4 F (36.3 C) (Oral)  Resp 20  Ht 5\' 8"  (1.727 m)  Wt 68.04 kg (150 lb)  BMI 22.81 kg/m2  SpO2 100% Vital signs in last 24 hours: Temp:  [97.4 F (36.3 C)-98.2 F (36.8 C)] 97.4 F (36.3 C) (07/21 0930) Pulse Rate:  [61-100] 85 (07/21 0940) Resp:  [18-20] 20 (07/21 0930) BP: (128-188)/(72-91) 175/91 mmHg (07/21 0940) SpO2:  [99 %-100 %] 100 % (07/21 0930)  Intake/Output from previous day: 07/20 0701 - 07/21 0700 In: 240 [P.O.:240] Out: -  Intake/Output this shift: Total I/O In: 240 [P.O.:240] Out: -  Nutritional status: Cardiac  Neurologic Exam: General: NAD Mental Status: Alert, oriented, thought content appropriate.  Speech fluent without evidence of aphasia.  Able to follow 3 step commands without difficulty. Cranial Nerves: II: Discs flat bilaterally; Visual fields grossly normal, pupils equal, round, reactive to light and accommodation III,IV, VI: ptosis not present, extra-ocular motions intact bilaterally V,VII: smile symmetric, facial light touch sensation normal bilaterally VIII: hearing normal bilaterally IX,X: gag reflex present XI: bilateral shoulder shrug XII: midline tongue extension without atrophy or fasciculations  Motor: Right : Upper extremity   5/5    Left:     Upper extremity   5/5  Lower extremity   5/5     Lower extremity   5/5 Tone and bulk:normal tone throughout; no atrophy noted Sensory: Pinprick and light touch intact throughout, bilaterally Deep Tendon Reflexes:  Right: Upper Extremity   Left: Upper extremity   biceps (C-5 to C-6) 2/4   biceps (C-5 to C-6) 2/4 tricep (C7) 2/4    triceps (C7) 2/4 Brachioradialis (C6) 2/4  Brachioradialis (C6) 2/4  Lower Extremity Lower Extremity  quadriceps (L-2 to L-4) 2/4   quadriceps (L-2 to L-4) 2/4 Achilles (S1) 2/4   Achilles (S1) 2/4  Plantars: Right: downgoing   Left:  downgoing     Lab Results: Basic Metabolic Panel:  Recent Labs Lab 07/08/14 1104 07/08/14 1520  NA 139  --   K 4.1  --   CL 101  --   CO2 27  --   GLUCOSE 93  --   BUN 19  --   CREATININE 0.90 0.86  CALCIUM 8.8  --     Liver Function Tests:  Recent Labs Lab 07/08/14 1104  AST 34  ALT 23  ALKPHOS 59  BILITOT 0.5  PROT 7.0  ALBUMIN 3.8   No results found for this basename: LIPASE, AMYLASE,  in the last 168 hours No results found for this basename: AMMONIA,  in the last 168 hours  CBC:  Recent Labs Lab 07/08/14 1104 07/08/14 1520  WBC 7.1 7.0  NEUTROABS 5.4  --   HGB 13.7 13.0  HCT 40.8 38.9*  MCV 92.9 91.5  PLT 182 199    Cardiac Enzymes:  Recent Labs Lab 07/08/14 0956  TROPONINI <0.30    Lipid Panel:  Recent Labs Lab 07/09/14 0005  CHOL 156  TRIG 60  HDL 56  CHOLHDL 2.8  VLDL 12  LDLCALC 88    CBG:  Recent Labs Lab 07/08/14 1101 07/08/14 1618 07/08/14 2050 07/09/14 0658 07/09/14 1137  GLUCAP 94 235* 121* 85 96    Microbiology: Results for orders placed during the hospital encounter of 07/08/14  URINE CULTURE     Status: None   Collection Time    07/08/14  9:57 AM  Result Value Ref Range Status   Specimen Description URINE, CLEAN CATCH   Final   Special Requests NONE   Final   Culture  Setup Time     Final   Value: 07/08/2014 14:37     Performed at [facility]     Final   Value: 20,OOO COLONIES/ML     Performed at [facility]   Culture     Final   Value: Multiple bacterial morphotypes present, none predominant. Suggest appropriate recollection if clinically indicated.     Performed at [facility]   Report Status 07/09/2014 FINAL   Final    Coagulation Studies:  Recent Labs  07/08/14 1104  LABPROT 14.3  INR 1.11    Imaging: Dg Chest 2 View  07/08/2014   CLINICAL DATA:  Dizziness and weakness.  EXAM: CHEST  2 VIEW  COMPARISON:  Single view of the chest 11/25/2008  and 11/05/2007.  FINDINGS: Lungs are clear. Heart size is normal. No pneumothorax or pleural fluid.  IMPRESSION: No acute disease.   Electronically Signed   By: Inge Rise M.D.   On: 07/08/2014 10:35   Ct Head Wo Contrast  07/08/2014   CLINICAL DATA:  Syncopal episode.  EXAM: CT HEAD WITHOUT CONTRAST  TECHNIQUE: Contiguous axial images were obtained from the base of the skull through the vertex without intravenous contrast.  COMPARISON:  12/07/2011.  FINDINGS: Diffuse cerebral atrophy. Previously identified subdural hematoma on CT of 12/07/2011 has cleared. White matter changes consistent with a chronic ischemia noted. 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 Cerebral vascular calcification present. No acute bony abnormality. Mucosal thickening noted in the frontal, ethmoid, maxillary sinuses.  IMPRESSION: 1. No acute abnormality. Previously identified subdural hematoma has resolved. 2. Diffuse cerebral atrophy. 3. Chronic ischemic change. 4. Diffuse mucosal thickening of the paranasal sinuses consistent with chronic sinusitis.   Electronically Signed   By: Marcello Moores  Register   On: 07/08/2014 10:51   Mr Jodene Nam Head Wo Contrast  07/09/2014   CLINICAL DATA:  Syncopal episode.  EXAM: MRI HEAD WITHOUT CONTRAST  MRA HEAD WITHOUT CONTRAST  TECHNIQUE: Multiplanar, multiecho pulse sequences of the brain and surrounding structures were obtained without intravenous contrast. Angiographic images of the head were obtained using MRA technique without contrast.  COMPARISON:  07/08/2014 head CT. 11/28/2008 MR angiogram of the neck. 10/26/2007 brain MR and MR angiogram circle Willis.  FINDINGS: MRI HEAD FINDINGS  No acute infarct.  Remote right cerebellar infarct.  Mild to moderate small vessel disease type changes.  No intracranial mass lesion noted on this unenhanced exam.  Global atrophy without hydrocephalus.  Abnormal appearance right vertebral artery.  Please see below.  Paranasal sinus mucosal thickening/  partial opacification most notable ethmoid sinus air cells.  Degenerative changes C1-2 level with transverse ligament hypertrophy causing spinal stenosis and cord compression. This was noted previously but appears to have progressed slightly. MR cervical spine can be obtained for further delineation. Question if this may contribute to patient's syncopal episodes. Less notable cervical spondylotic changes below this region.  MRA HEAD FINDINGS  Right vertebral artery is occluded proximal to the right posterior inferior cerebellar artery. The right vertebral artery is narrowed beyond this region which may represent retrograde flow supplying the right posterior inferior cerebellar artery which is narrowed and irregular.  Ectatic left vertebral artery without high-grade stenosis. Nonvisualized left posterior inferior cerebellar artery.  Ectatic basilar artery without high-grade stenosis.  Only small portion of the right anterior  inferior cerebellar arteries visualized. Moderate tandem stenosis left anterior inferior cerebellar artery.  Mild narrowing left superior cerebellar artery.  Fetal type contribution to the right posterior cerebral artery. Mild narrowing mid aspect of the right posterior cerebral artery. Minimal to mild posterior cerebral artery branch vessel irregularity bilaterally.  Anterior circulation without medium or large size vessel significant stenosis or occlusion.  Middle cerebral artery branch vessel irregularity.  No aneurysm detected.  IMPRESSION: MRI HEAD :  No acute infarct.  Remote right cerebellar infarct.  Mild to moderate small vessel disease type changes.  Global atrophy without hydrocephalus.  Abnormal appearance right vertebral artery.  Please see below.  Paranasal sinus mucosal thickening/ partial opacification most notable ethmoid sinus air cells.  Degenerative changes C1-2 level with transverse ligament hypertrophy causing spinal stenosis and cord compression. This was noted previously  but appears to have progressed slightly. MR cervical spine can be obtained for further delineation. Question if this may contribute to patient's syncopal episodes. Less notable cervical spondylotic changes below this region.  MRA HEAD FINDINGS  Right vertebral artery is occluded proximal to the right posterior inferior cerebellar artery. The right vertebral artery is narrowed beyond this region which may represent retrograde flow supplying the right posterior inferior cerebellar artery which is narrowed and irregular.  Nonvisualized left posterior inferior cerebellar artery.  Only small portion of the right anterior inferior cerebellar artery is visualized. Moderate tandem stenosis left anterior inferior cerebellar artery.  Mild narrowing left superior cerebellar artery.  Fetal type contribution to the right posterior cerebral artery. Mild narrowing mid aspect of the right posterior cerebral artery. Minimal to mild posterior cerebral artery branch vessel irregularity bilaterally.  Anterior circulation without medium or large size vessel significant stenosis or occlusion.  Middle cerebral artery branch vessel irregularity.  These results were called by telephone at the time of interpretation on 07/09/2014 at 11:48 pm to Dr. Dorian Pod , who verbally acknowledged these results.   Electronically Signed   By: Chauncey Cruel M.D.   On: 07/09/2014 12:08   Mr Brain Wo Contrast  07/09/2014   CLINICAL DATA:  Syncopal episode.  EXAM: MRI HEAD WITHOUT CONTRAST  MRA HEAD WITHOUT CONTRAST  TECHNIQUE: Multiplanar, multiecho pulse sequences of the brain and surrounding structures were obtained without intravenous contrast. Angiographic images of the head were obtained using MRA technique without contrast.  COMPARISON:  07/08/2014 head CT. 11/28/2008 MR angiogram of the neck. 10/26/2007 brain MR and MR angiogram circle Willis.  FINDINGS: MRI HEAD FINDINGS  No acute infarct.  Remote right cerebellar infarct.  Mild to moderate  small vessel disease type changes.  No intracranial mass lesion noted on this unenhanced exam.  Global atrophy without hydrocephalus.  Abnormal appearance right vertebral artery.  Please see below.  Paranasal sinus mucosal thickening/ partial opacification most notable ethmoid sinus air cells.  Degenerative changes C1-2 level with transverse ligament hypertrophy causing spinal stenosis and cord compression. This was noted previously but appears to have progressed slightly. MR cervical spine can be obtained for further delineation. Question if this may contribute to patient's syncopal episodes. Less notable cervical spondylotic changes below this region.  MRA HEAD FINDINGS  Right vertebral artery is occluded proximal to the right posterior inferior cerebellar artery. The right vertebral artery is narrowed beyond this region which may represent retrograde flow supplying the right posterior inferior cerebellar artery which is narrowed and irregular.  Ectatic left vertebral artery without high-grade stenosis. Nonvisualized left posterior inferior cerebellar artery.  Ectatic basilar artery without high-grade  stenosis.  Only small portion of the right anterior inferior cerebellar arteries visualized. Moderate tandem stenosis left anterior inferior cerebellar artery.  Mild narrowing left superior cerebellar artery.  Fetal type contribution to the right posterior cerebral artery. Mild narrowing mid aspect of the right posterior cerebral artery. Minimal to mild posterior cerebral artery branch vessel irregularity bilaterally.  Anterior circulation without medium or large size vessel significant stenosis or occlusion.  Middle cerebral artery branch vessel irregularity.  No aneurysm detected.  IMPRESSION: MRI HEAD :  No acute infarct.  Remote right cerebellar infarct.  Mild to moderate small vessel disease type changes.  Global atrophy without hydrocephalus.  Abnormal appearance right vertebral artery.  Please see below.   Paranasal sinus mucosal thickening/ partial opacification most notable ethmoid sinus air cells.  Degenerative changes C1-2 level with transverse ligament hypertrophy causing spinal stenosis and cord compression. This was noted previously but appears to have progressed slightly. MR cervical spine can be obtained for further delineation. Question if this may contribute to patient's syncopal episodes. Less notable cervical spondylotic changes below this region.  MRA HEAD FINDINGS  Right vertebral artery is occluded proximal to the right posterior inferior cerebellar artery. The right vertebral artery is narrowed beyond this region which may represent retrograde flow supplying the right posterior inferior cerebellar artery which is narrowed and irregular.  Nonvisualized left posterior inferior cerebellar artery.  Only small portion of the right anterior inferior cerebellar artery is visualized. Moderate tandem stenosis left anterior inferior cerebellar artery.  Mild narrowing left superior cerebellar artery.  Fetal type contribution to the right posterior cerebral artery. Mild narrowing mid aspect of the right posterior cerebral artery. Minimal to mild posterior cerebral artery branch vessel irregularity bilaterally.  Anterior circulation without medium or large size vessel significant stenosis or occlusion.  Middle cerebral artery branch vessel irregularity.  These results were called by telephone at the time of interpretation on 07/09/2014 at 11:48 pm to Dr. Dorian Pod , who verbally acknowledged these results.   Electronically Signed   By: Chauncey Cruel M.D.   On: 07/09/2014 12:08    Medications:  Scheduled: .  stroke: mapping our early stages of recovery book   Does not apply Once  . aspirin EC  81 mg Oral Daily  . enoxaparin (LOVENOX) injection  40 mg Subcutaneous Q24H  . lisinopril  2.5 mg Oral Daily  . niacin  250 mg Oral QHS   VASCULAR LAB  PRELIMINARY PRELIMINARY PRELIMINARY PRELIMINARY  Carotid  duplex completed.  Preliminary report: Bilateral: 1-39% ICA stenosis. Vertebral artery flow is antegrade.   Recent 2 D echo --normal.   Assessment/Plan:  80 y.o. male with near syncopal episode.  Stroke work up is negative.  Patient has had no further episodes.  MRI brain did showDegenerative changes C1-2 level with transverse ligament hypertrophy causing spinal stenosis and cord compression. At this time patient is showing no UMN symptoms and stenosis not related to syncope.  Have discussed with Primary team and primary team ordering Cervical spine MRI as inpatient or will have patient follow up as out patient with neurosurgery. .   No further work up for syncope indicated by neurology at this time.   Will S/O    Etta Quill PA-C [facility] [phone]  07/09/2014, 2:33 PM

## 2014-07-10 NOTE — ED Provider Notes (Signed)
Medical screening examination/treatment/procedure(s) were conducted as a shared visit with non-physician practitioner(s) and myself.  I personally evaluated the patient during the encounter.   EKG Interpretation   Date/Time:  Monday July 08 2014 09:44:30 EDT Ventricular Rate:  74 PR Interval:    QRS Duration: 114 QT Interval:  423 QTC Calculation: 469 R Axis:   -68 Text Interpretation:  Accelerated junctional rhythm Left anterior  fascicular block Anterior infarct, old ED PHYSICIAN INTERPRETATION  AVAILABLE IN [facility] Confirmed by TEST, Record ([id number]) on  07/10/2014 7:14:43 AM     Patient with aphasia. Back to baseline. Will need to get workup. No deficit at this time. Also said recent near syncope. Admit to internal medicine  Jasper Riling. Alvino Chapel, MD 07/10/14 [phone]

## 2014-08-19 ENCOUNTER — Encounter: Payer: Self-pay | Admitting: General Surgery

## 2014-08-19 ENCOUNTER — Ambulatory Visit (INDEPENDENT_AMBULATORY_CARE_PROVIDER_SITE_OTHER): Payer: Medicare Other | Admitting: General Surgery

## 2014-08-19 VITALS — BP 140/70 | HR 68 | Resp 12 | Ht 68.0 in | Wt 155.0 lb

## 2014-08-19 DIAGNOSIS — I8393 Asymptomatic varicose veins of bilateral lower extremities: Secondary | ICD-10-CM

## 2014-08-19 DIAGNOSIS — I8390 Asymptomatic varicose veins of unspecified lower extremity: Secondary | ICD-10-CM

## 2014-08-19 NOTE — Patient Instructions (Signed)
Patient advised to continue wearing compression hose daily. Patient to return as needed. The patient is aware to call back for any questions or concerns.

## 2014-08-19 NOTE — Progress Notes (Signed)
Patient ID: Matthew Bright, male   DOB: Nov 09, 1926, 80 y.o.   MRN: [id number]  Chief Complaint  Patient presents with  . Follow-up    1 year follow up varicose veins    HPI Matthew Bright is a 80 y.o. male who presents for a 1 year follow up of varicose veins. Patient reports no leg symptoms and is still using compression hose with good control.    HPI  Past Medical History  Diagnosis Date  . Hyperlipidemia   . Enlarged prostate   . Exertional dyspnea   . Syncope and collapse  2008; October 2012  . Hypertension   . Varicose veins   . Carotid artery disease     per CTA but no significant stenosis  . Allergy   . Hernia 2006  . Special screening for malignant neoplasms, colon   . Vertigo 2008  . Cerebellar stroke, acute 2008    denies residual on 07/08/2014  . OA (osteoarthritis)     "~ qwhere you can have it"  . Prostate cancer     pt denies this hx on 07/08/2014    Past Surgical History  Procedure Laterality Date  . Prostate biopsy  ~ 1990; ~ 2009  . Vein ligation and stripping Left 1953, 1968, 1980's; 1992  . Total hip arthroplasty Bilateral 2000-2001    Dr. Percell Miller  . Lumbar microdiscectomy  1999  . Hemorrhoid surgery  1980's  . Anterior cervical decomp/discectomy fusion  11/1998    C5-C6  . Cardiovascular stress test  11/13/2007    EF 67%, NO ISCHEMIA   . Colonoscopy  2008    Dr. Sharlett Iles in [place]  . Cataract extraction w/ intraocular lens  implant, bilateral Bilateral 2010  . Inguinal hernia repair Right 2007,2009  . Cardiac catheterization  02/2006    [facility]; no significant CAD noted.   . Vein ligation and stripping Right 1992    Family History  Problem Relation Age of Onset  . Stroke Father   . Heart attack Father     Social History History  Substance Use Topics  . Smoking status: Never Smoker   . Smokeless tobacco: Never Used  . Alcohol Use: No    Allergies  Allergen Reactions  . Avalide [Irbesartan-Hydrochlorothiazide] Other (See  Comments)    Unknown  . Darvocet [Propoxyphene N-Acetaminophen] Other (See Comments)    Unknown  . Lovastatin     Unknown  . Zocor [Simvastatin]     Unknown    Current Outpatient Prescriptions  Medication Sig Dispense Refill  . aspirin EC 81 MG EC tablet Take 1 tablet (81 mg total) by mouth daily.      . B Complex-Biotin-FA (B COMPLEX 100 TR) TBCR Take 1 tablet by mouth daily.       . Coenzyme Q10 (COQ10 PO) Take 1 capsule by mouth 2 (two) times daily. Taking 200mg  daily      . lisinopril (PRINIVIL,ZESTRIL) 5 MG tablet Take 0.5 tablets (2.5 mg total) by mouth daily.  45 tablet  1  . Multiple Vitamins-Minerals (CENTRUM PO) Take 1 tablet by mouth daily.       . niacinamide 500 MG tablet Take 250 mg by mouth daily.       . Nutritional Supplements (JOINT FORMULA PO) Take 2 tablets by mouth 2 (two) times daily.      . Probiotic Product (PROBIOTIC DAILY PO) Take 1 tablet by mouth daily.       No current facility-administered medications for  this visit.    Review of Systems Review of Systems  Constitutional: Negative.   Respiratory: Negative.   Cardiovascular: Negative.     Blood pressure 140/70, pulse 68, resp. rate 12, height 5\' 8"  (1.727 m), weight 155 lb (70.308 kg).  Physical Exam Physical Exam  Constitutional: He is oriented to person, place, and time. He appears well-developed and well-nourished.  Cardiovascular:  Pulses:      Dorsalis pedis pulses are 2+ on the right side, and 2+ on the left side.       Posterior tibial pulses are 2+ on the right side, and 2+ on the left side.  Mild edema in both legs. No stasis changes. Residual VV noted, left more than right.  Abdominal: Soft. Normal appearance and bowel sounds are normal. There is no hepatosplenomegaly. There is no tenderness. No hernia.  Neurological: He is alert and oriented to person, place, and time.  Skin: Skin is warm and dry.    Data Reviewed none  Assessment    Bilateral VV- remains asymptomatic. Prior  ligation and stripping. Pt has had no changes in last several yrs. He is bothered more so with his back and activity is restricted from that.      Plan    Will follow up on a prn basis. Pt advised and he is agreeable.         Brinlee Gambrell G 08/19/2014, 12:46 PM

## 2014-10-01 ENCOUNTER — Ambulatory Visit (INDEPENDENT_AMBULATORY_CARE_PROVIDER_SITE_OTHER): Payer: Medicare Other | Admitting: Cardiology

## 2014-10-01 ENCOUNTER — Encounter: Payer: Self-pay | Admitting: Cardiology

## 2014-10-01 VITALS — BP 124/72 | HR 78 | Ht 68.0 in | Wt 156.0 lb

## 2014-10-01 DIAGNOSIS — E871 Hypo-osmolality and hyponatremia: Secondary | ICD-10-CM

## 2014-10-01 DIAGNOSIS — I359 Nonrheumatic aortic valve disorder, unspecified: Secondary | ICD-10-CM

## 2014-10-01 DIAGNOSIS — R55 Syncope and collapse: Secondary | ICD-10-CM

## 2014-10-01 DIAGNOSIS — I119 Hypertensive heart disease without heart failure: Secondary | ICD-10-CM

## 2014-10-01 NOTE — Patient Instructions (Signed)
Your physician recommends that you continue on your current medications as directed. Please refer to the Current Medication list given to you today.  Your physician wants you to follow-up in: 6 month ov You will receive a reminder letter in the mail two months in advance. If you don't receive a letter, please call our office to schedule the follow-up appointment.  

## 2014-10-01 NOTE — Progress Notes (Signed)
Matthew Bright Date of Birth:  1926/10/03 [address] [place], [place]  [address] [phone]        Fax   [phone]   History of Present Illness: This pleasant 80 year old gentleman is seen for a scheduled followup office visit. He has a past history of essential hypertension and dyslipidemia. He has also had a history of previous syncopal spells possibly secondary to overmedication associated with some degree of dehydration. His family physician has worked him up for possible carotid artery disease. The patient has a history of dyslipidemia but is intolerant of high-dose statins because of the development of elevated CK levels. Since we last saw him he has developed worsening leg weakness and on his own he stopped taking Crestor altogether back in October 2014.  Since last visit he has had no new cardiac symptoms. He had a recent check of his leg circulation by his vascular physician in [place]. He has had problems with his back. He is now wearing a back brace and getting physical therapy to his back. He had a recent bone density test which showed osteopenia. Dr. Percell Miller is his orthopedist and he also sees Dr. Layne Benton in that practice. He has a past history of aortic valve disease with aortic insufficiency, and a history of mild to moderate mitral regurgitation.  His last echocardiogram on 04/19/14 showed normal left ventricle systolic function with an ejection fraction of 60-65% and there was grade 1 diastolic dysfunction and mild to moderate mitral regurgitation. The patient was hospitalized overnight on July 30 after having a near syncopal episode while preparing breakfast.  His workup was normal.  Carotid Dopplers showed less than 39% stenosis bilaterally.   Current Outpatient Prescriptions  Medication Sig Dispense Refill  . aspirin EC 81 MG EC tablet Take 1 tablet (81 mg total) by mouth daily.      . B Complex-Biotin-FA (B COMPLEX 100 TR) TBCR Take 1  tablet by mouth daily.       . Coenzyme Q10 (COQ10 PO) Take 1 capsule by mouth 2 (two) times daily. Taking 200mg  daily      . lisinopril (PRINIVIL,ZESTRIL) 5 MG tablet Take 0.5 tablets (2.5 mg total) by mouth daily.  45 tablet  1  . Multiple Vitamins-Minerals (CENTRUM PO) Take 1 tablet by mouth daily.       . niacinamide 500 MG tablet Take 250 mg by mouth daily.       . Nutritional Supplements (JOINT FORMULA PO) Take 2 tablets by mouth 2 (two) times daily.      . Probiotic Product (PROBIOTIC DAILY PO) Take 1 tablet by mouth daily.       No current facility-administered medications for this visit.    Allergies  Allergen Reactions  . Avalide [Irbesartan-Hydrochlorothiazide] Other (See Comments)    Unknown  . Darvocet [Propoxyphene N-Acetaminophen] Other (See Comments)    Unknown  . Lovastatin     Unknown  . Zocor [Simvastatin]     Unknown    Patient Active Problem List   Diagnosis Date Noted  . TIA (transient ischemic attack) 07/08/2014  . Aortic valve disease 04/05/2014  . Asymptomatic varicose veins 08/16/2013  . Hyposmolality and/or hyponatremia 04/02/2013  . Syncope, vasovagal 12/08/2011  . Hypercholesterolemia 11/30/2011  . HTN (hypertension) 10/22/2011  . Abnormal EKG 10/22/2011    History  Smoking status  . Never Smoker   Smokeless tobacco  . Never Used    History  Alcohol Use No  Family History  Problem Relation Age of Onset  . Stroke Father   . Heart attack Father     Review of Systems: Constitutional: no fever chills diaphoresis or fatigue or change in weight.  Head and neck: no hearing loss, no epistaxis, no photophobia or visual disturbance. Respiratory: No cough, shortness of breath or wheezing. Cardiovascular: No chest pain peripheral edema, palpitations. Gastrointestinal: No abdominal distention, no abdominal pain, no change in bowel habits hematochezia or melena. Genitourinary: No dysuria, no frequency, no urgency, no  nocturia. Musculoskeletal:No arthralgias, no back pain, no gait disturbance or myalgias. Neurological: No dizziness, no headaches, no numbness, no seizures, no syncope, no weakness, no tremors. Hematologic: No lymphadenopathy, no easy bruising. Psychiatric: No confusion, no hallucinations, no sleep disturbance.    Physical Exam: Filed Vitals:   10/01/14 1523  BP: 124/72  Pulse: 78  The patient appears to be in no distress.  He is wearing a back brace.  He is quite stooped over.  Head and neck exam reveals that the pupils are equal and reactive.  The extraocular movements are full.  There is no scleral icterus.  Mouth and pharynx are benign.  No lymphadenopathy.  No carotid bruits.  The jugular venous pressure is normal.  Thyroid is not enlarged or tender.  Chest is clear to percussion and auscultation.  No rales or rhonchi.  Expansion of the chest is symmetrical.  Heart reveals no abnormal lift or heave.  First and second heart sounds are normal.  There is a soft apical  holosystolic murmur  The abdomen is soft and nontender.  Bowel sounds are normoactive.  There is no hepatosplenomegaly or mass.  There are no abdominal bruits.  Extremities reveal no phlebitis or edema.  Pedal pulses are good.  There is no cyanosis or clubbing.  Neurologic exam is normal strength and no lateralizing weakness.  No sensory deficits.  Integument reveals no rash  EKG today shows normal sinus rhythm with first degree AV block and incomplete right bundle branch block and left anterior hemiblock and evidence of a old anteroseptal myocardial infarction  Assessment / Plan: 1. essential hypertension 2. Dyslipidemia 3. valvular heart disease with mild to moderate mitral regurgitation and mild aortic insufficiency. 4. history of repeat episodes of syncope secondary to vasovagal response 5. intolerance to statins with prior elevated CK levels, followed by his PCP 6. cervical spinal stenosis followed by Dr.  Hal Neer  Plan: Continue same medication.  Recheck in 6 months for followup office visit

## 2014-10-01 NOTE — Assessment & Plan Note (Signed)
Patient has a past history of  hyponatremia.  However on his most recent hospitalization in July 2015 his serum sodium was normal.

## 2014-10-01 NOTE — Assessment & Plan Note (Signed)
The patient had another episode of near syncope in July and was hospitalized overnight.  He was on the neurologic service.  Full workup was unremarkable.  He was noted to have spinal stenosis of C2/C3 and he followed up after discharge with Dr. Hal Neer, his neurosurgeon.  No surgery is planned

## 2014-10-01 NOTE — Assessment & Plan Note (Signed)
The patient is mild aortic valve disease with mild aortic insufficiency not causing any cardiac symptoms

## 2014-11-22 ENCOUNTER — Other Ambulatory Visit: Payer: Self-pay

## 2014-11-22 MED ORDER — LISINOPRIL 5 MG PO TABS: 2.5000 mg | ORAL_TABLET | Freq: Every day | ORAL | Status: DC

## 2014-12-08 ENCOUNTER — Emergency Department (HOSPITAL_COMMUNITY)
Admission: EM | Admit: 2014-12-08 | Discharge: 2014-12-08 | Disposition: A | Discharge status: Home / Self Care | Payer: Medicare Other | Attending: Emergency Medicine | Admitting: Emergency Medicine

## 2014-12-08 ENCOUNTER — Emergency Department (HOSPITAL_COMMUNITY): Payer: Medicare Other

## 2014-12-08 ENCOUNTER — Encounter (HOSPITAL_COMMUNITY): Payer: Self-pay | Admitting: *Deleted

## 2014-12-08 DIAGNOSIS — M25559 Pain in unspecified hip: Secondary | ICD-10-CM

## 2014-12-08 DIAGNOSIS — Z8673 Personal history of transient ischemic attack (TIA), and cerebral infarction without residual deficits: Secondary | ICD-10-CM | POA: Insufficient documentation

## 2014-12-08 DIAGNOSIS — Z79899 Other long term (current) drug therapy: Secondary | ICD-10-CM | POA: Insufficient documentation

## 2014-12-08 DIAGNOSIS — M5432 Sciatica, left side: Secondary | ICD-10-CM | POA: Diagnosis not present

## 2014-12-08 DIAGNOSIS — Z8639 Personal history of other endocrine, nutritional and metabolic disease: Secondary | ICD-10-CM | POA: Diagnosis not present

## 2014-12-08 DIAGNOSIS — Z8546 Personal history of malignant neoplasm of prostate: Secondary | ICD-10-CM | POA: Insufficient documentation

## 2014-12-08 DIAGNOSIS — M25552 Pain in left hip: Secondary | ICD-10-CM | POA: Diagnosis not present

## 2014-12-08 DIAGNOSIS — I10 Essential (primary) hypertension: Secondary | ICD-10-CM | POA: Insufficient documentation

## 2014-12-08 DIAGNOSIS — Z9889 Other specified postprocedural states: Secondary | ICD-10-CM | POA: Diagnosis not present

## 2014-12-08 DIAGNOSIS — M1990 Unspecified osteoarthritis, unspecified site: Secondary | ICD-10-CM | POA: Insufficient documentation

## 2014-12-08 DIAGNOSIS — Z8719 Personal history of other diseases of the digestive system: Secondary | ICD-10-CM | POA: Insufficient documentation

## 2014-12-08 DIAGNOSIS — M549 Dorsalgia, unspecified: Secondary | ICD-10-CM

## 2014-12-08 DIAGNOSIS — Z7982 Long term (current) use of aspirin: Secondary | ICD-10-CM | POA: Diagnosis not present

## 2014-12-08 MED ORDER — METHOCARBAMOL 500 MG PO TABS: 500.0000 mg | ORAL_TABLET | Freq: Two times a day (BID) | ORAL | Status: DC

## 2014-12-08 MED ORDER — METHOCARBAMOL 1000 MG/10ML IJ SOLN: 1000.0000 mg | Freq: Once | INTRAMUSCULAR | Status: DC

## 2014-12-08 MED ORDER — METHOCARBAMOL 1000 MG/10ML IJ SOLN
1000.0000 mg | Freq: Once | INTRAVENOUS | Status: AC
  Administered 2014-12-08: 1000 mg via INTRAVENOUS
  Filled 2014-12-08: qty 10

## 2014-12-08 MED ORDER — ONDANSETRON HCL 4 MG/2ML IJ SOLN
4.0000 mg | Freq: Once | INTRAMUSCULAR | Status: AC
  Administered 2014-12-08: 4 mg via INTRAVENOUS
  Filled 2014-12-08: qty 2

## 2014-12-08 MED ORDER — MORPHINE SULFATE 4 MG/ML IJ SOLN
4.0000 mg | Freq: Once | INTRAMUSCULAR | Status: AC
  Administered 2014-12-08: 4 mg via INTRAVENOUS
  Filled 2014-12-08: qty 1

## 2014-12-08 MED ORDER — HYDROCODONE-ACETAMINOPHEN 5-325 MG PO TABS: 1.0000 | ORAL_TABLET | ORAL | Status: DC | PRN

## 2014-12-08 NOTE — ED Notes (Signed)
Patient transported to X-ray 

## 2014-12-08 NOTE — ED Provider Notes (Signed)
CSN: [id number]     Arrival date & time 12/08/14  4098 History   First MD Initiated Contact with Patient 12/08/14 [phone]     Chief Complaint  Patient presents with  . Hip Pain     (Consider location/radiation/quality/duration/timing/severity/associated sxs/prior Treatment) HPI Comments: Patient is an 80 year old male with a past medical history of prostate cancer, hyperlipidemia, CAD, previous cerebellar stroke, and osteoarthritis who presents with left hip pain that started last night after he got out of the shower. Patient reports sharp and severe left hip that starts in the left lower back. Movement makes the pain worse. No alleviating factors. No injury or associated symptoms. Patient took an aspirin for pain without relief.    Past Medical History  Diagnosis Date  . Hyperlipidemia   . Enlarged prostate   . Exertional dyspnea   . Syncope and collapse  2008; October 2012  . Hypertension   . Varicose veins   . Carotid artery disease     per CTA but no significant stenosis  . Allergy   . Hernia 2006  . Special screening for malignant neoplasms, colon   . Vertigo 2008  . Cerebellar stroke, acute 2008    denies residual on 07/08/2014  . OA (osteoarthritis)     "~ qwhere you can have it"  . Prostate cancer     pt denies this hx on 07/08/2014   Past Surgical History  Procedure Laterality Date  . Prostate biopsy  ~ 1990; ~ 2009  . Vein ligation and stripping Left 1953, 1968, 1980's; 1992  . Total hip arthroplasty Bilateral 2000-2001    Dr. Percell Miller  . Lumbar microdiscectomy  1999  . Hemorrhoid surgery  1980's  . Anterior cervical decomp/discectomy fusion  11/1998    C5-C6  . Cardiovascular stress test  11/13/2007    EF 67%, NO ISCHEMIA   . Colonoscopy  2008    Dr. Sharlett Iles in [place]  . Cataract extraction w/ intraocular lens  implant, bilateral Bilateral 2010  . Inguinal hernia repair Right 2007,2009  . Cardiac catheterization  02/2006    Cath at [facility]; no significant CAD  noted.   . Vein ligation and stripping Right 1992   Family History  Problem Relation Age of Onset  . Stroke Father   . Heart attack Father    History  Substance Use Topics  . Smoking status: Never Smoker   . Smokeless tobacco: Never Used  . Alcohol Use: No    Review of Systems  Constitutional: Negative for fever, chills and fatigue.  HENT: Negative for trouble swallowing.   Eyes: Negative for visual disturbance.  Respiratory: Negative for shortness of breath.   Cardiovascular: Negative for chest pain and palpitations.  Gastrointestinal: Negative for nausea, vomiting, abdominal pain and diarrhea.  Genitourinary: Negative for dysuria and difficulty urinating.  Musculoskeletal: Positive for back pain and arthralgias. Negative for neck pain.  Skin: Negative for color change.  Neurological: Negative for dizziness and weakness.  Psychiatric/Behavioral: Negative for dysphoric mood.      Allergies  Avalide; Darvocet; Lovastatin; and Zocor  Home Medications   Prior to Admission medications   Medication Sig Start Date End Date Taking? Authorizing Provider  aspirin EC 81 MG EC tablet Take 1 tablet (81 mg total) by mouth daily. 07/09/14   Maryann Mikhail, DO  B Complex-Biotin-FA (B COMPLEX 100 TR) TBCR Take 1 tablet by mouth daily.     Historical Provider, MD  Coenzyme Q10 (COQ10 PO) Take 1 capsule by mouth  2 (two) times daily. Taking 200mg  daily    Historical Provider, MD  lisinopril (PRINIVIL,ZESTRIL) 5 MG tablet Take 0.5 tablets (2.5 mg total) by mouth daily. 11/22/14   Darlin Coco, MD  Multiple Vitamins-Minerals (CENTRUM PO) Take 1 tablet by mouth daily.     Historical Provider, MD  niacinamide 500 MG tablet Take 250 mg by mouth daily.     Historical Provider, MD  Nutritional Supplements (JOINT FORMULA PO) Take 2 tablets by mouth 2 (two) times daily.    Historical Provider, MD  Probiotic Product (PROBIOTIC DAILY PO) Take 1 tablet by mouth daily.    Historical Provider, MD    BP 190/89 mmHg  Pulse 76  Temp(Src) 97.7 F (36.5 C) (Oral)  Resp 12  Ht 5\' 8"  (1.727 m)  Wt 151 lb (68.493 kg)  BMI 22.96 kg/m2  SpO2 93% Physical Exam  Constitutional: He is oriented to person, place, and time. He appears well-developed and well-nourished. No distress.  HENT:  Head: Normocephalic and atraumatic.  Eyes: Conjunctivae and EOM are normal.  Neck: Normal range of motion.  Cardiovascular: Normal rate, regular rhythm and intact distal pulses.  Exam reveals no gallop and no friction rub.   No murmur heard. Pulmonary/Chest: Effort normal and breath sounds normal. He has no wheezes. He has no rales. He exhibits no tenderness.  Abdominal: Soft. He exhibits no distension. There is no tenderness. There is no rebound.  No palpable mass or tenderness to palpation.   Musculoskeletal: Normal range of motion.  No midline spine tenderness. Left paraspinal tenderness to palpation and left buttock tenderness to palpation. Negative straight leg raise. No left hip deformity or lateral tenderness to palpation.   Neurological: He is alert and oriented to person, place, and time. Coordination normal.  Speech is goal-oriented. Moves limbs without ataxia.   Skin: Skin is warm and dry.  Psychiatric: He has a normal mood and affect. His behavior is normal.  Nursing note and vitals reviewed.   ED Course  Procedures (including critical care time) Labs Review Labs Reviewed - No data to display  Imaging Review Dg Lumbar Spine Complete  12/08/2014   CLINICAL DATA:  One day history of low back pain. No history of trauma.  EXAM: LUMBAR SPINE - COMPLETE 4+ VIEW  COMPARISON:  None.  FINDINGS: Frontal, lateral, spot lumbosacral lateral, and bilateral oblique views were obtained. The there are 5 non-rib-bearing lumbar type vertebral bodies. There is lumbar levoscoliosis with mild rotatory component. There is no fracture or spondylolisthesis. There is moderate disc space narrowing at L2-3, L3-4,  L4-5, and L5-S1. There are prominent anterior osteophytes at L3, L4, L5, and S1. There is facet osteoarthritic change at all levels bilaterally. There are total hip replacements bilaterally. There are foci of arterial vascular calcification in the pelvis bilaterally.  IMPRESSION: Scoliosis and multilevel osteoarthritic change. No fracture or spondylolisthesis.   Electronically Signed   By: Lowella Grip M.D.   On: 12/08/2014 07:54   Dg Hip Complete Left  12/08/2014   CLINICAL DATA:  Hip pain started 1 day ago primarily in the left hip.  EXAM: LEFT HIP - COMPLETE 2+ VIEW  COMPARISON:  None.  FINDINGS: There is generalized osteopenia. There are bilateral total hip arthroplasties. There is a small area of lucency adjacent to the medial left acetabular cup component as can be seen with particle disease. There is no acute fracture or dislocation. There is heterotopic ossification adjacent to the greater and lesser trochanter bilaterally. There is bridging heterotopic  ossification along the superolateral acetabulum extending to the greater trochanter.  IMPRESSION: 1. No acute osseous injury of the left hip. 2. There is a small area of lucency adjacent to the medial left acetabular cup component as can be seen with particle disease. 3. Heterotopic ossification around bilateral hips, left greater than right.   Electronically Signed   By: Kathreen Devoid   On: 12/08/2014 07:47   US Abdomen Limited  12/08/2014   CLINICAL DATA:  Back pain  EXAM: ULTRASOUND OF ABDOMINAL AORTA  TECHNIQUE: Ultrasound examination of the abdominal aorta was performed to evaluate for abdominal aortic aneurysm.  COMPARISON:  None.  FINDINGS: Abdominal Aorta  No aneurysm identified. There is atherosclerotic change in the aorta. There is no periaortic fluid or adenopathy.  Maximum AP  Diameter:  2.5 cm proximal; 1.9 cm mid ; 1.8 cm distal  Maximum TRV  Diameter: 2.3 cm proximal ; 2.2 cm mid; 1.8 cm distal  There is no common iliac artery  aneurysm on either side.  IMPRESSION: Atherosclerotic change in the aorta but no demonstrable aneurysm.   Electronically Signed   By: Lowella Grip M.D.   On: 12/08/2014 08:08     EKG Interpretation None      MDM   Final diagnoses:  Hip pain  Back pain  Sciatica, left   6:56 AM Patient's left hip and lumbar spine imaging pending. Vitals stable and patient afebrile. No neurovascular compromise. Vitals stable and patient afebrile. No injury.   10:04 AM Patient able to ambulate with less pain than previously. Patient will be discharged with Vicodin and Robaxin as needed for pain. Xrays unremarkable for acute changes. Patient advised to follow up with Orthopedics. Patient likely has sciatica causing his pain.     Alvina Chou, PA-C 12/08/14 1008

## 2014-12-08 NOTE — Discharge Instructions (Signed)
Take Vicodin as needed for pain. Take Robaxin as needed for muscle spasm. You may take these medications together along with your ibuprofen. Refer to attached documents for more information. Follow up with your doctor as needed. Return to the ED with worsening or concerning symptoms.

## 2014-12-08 NOTE — ED Notes (Signed)
Pt arrrives from home via [facility]. Pt c/o left hip pain 10/10 radiating to buttocks and legs, non-traumatic. 130/80 70.

## 2014-12-08 NOTE — ED Provider Notes (Signed)
80 year old male who is difficult historian comes in with pain in the left lumbar area radiating to the left hip and leg. It has been present for the last several days. He describes a sharp pain which is worse with movement or trying to walk. There is no numbness or tingling. There is no bowel or bladder dysfunction. He does have history of back problems previously which had been treated with physical therapy. On exam, there is moderate paralumbar spasm and mild tenderness in the left paralumbar area. Straight leg raise is negative. There are no motor or sensory deficits. Pain clearly seems consistent with sciatica. Deep palpation of the abdomen showed no pulsatile mass and there is no bruit. Of note, are at had 3/6 holosystolic murmur loudest at the apex. He will be treated for musculoskeletal back pain, but ultrasound will be obtained to screen for aneurysm.  Medical screening examination/treatment/procedure(s) were conducted as a shared visit with non-physician practitioner(s) and myself.  I personally evaluated the patient during the encounter.    Delora Fuel, MD 33/83/29 1916

## 2015-01-09 ENCOUNTER — Telehealth: Payer: Self-pay | Admitting: Cardiology

## 2015-01-09 NOTE — Telephone Encounter (Signed)
Patient called c/o vertigo for last several days  Advised patient to contact PCP ( Dr. Mare Ferrari out of the office until 1/260, verbalized understanding

## 2015-01-09 NOTE — Telephone Encounter (Signed)
New message  Pt called states that he is experiencing Vertigo and he may need Meclizine. Pt states that he has experienced vertigo twice over the past 3 days. Pt states that his BP drops every time he eats breakfast he would also like to discuss that.

## 2015-02-19 DIAGNOSIS — I340 Nonrheumatic mitral (valve) insufficiency: Secondary | ICD-10-CM | POA: Diagnosis not present

## 2015-02-21 ENCOUNTER — Ambulatory Visit: Payer: Self-pay | Admitting: Neurology

## 2015-02-21 ENCOUNTER — Inpatient Hospital Stay: Payer: Self-pay | Admitting: Internal Medicine

## 2015-02-24 ENCOUNTER — Encounter
Admit: 2015-02-24 | Disposition: A | Discharge status: Home / Self Care | Payer: Self-pay | Attending: Internal Medicine | Admitting: Internal Medicine

## 2015-03-19 LAB — COMPREHENSIVE METABOLIC PANEL
ANION GAP: 4 — AB (ref 7–16)
Albumin: 3.5 g/dL
Alkaline Phosphatase: 65 U/L
BUN: 21 mg/dL — ABNORMAL HIGH
Bilirubin,Total: 0.5 mg/dL
CO2: 28 mmol/L
CREATININE: 0.73 mg/dL
Calcium, Total: 8.7 mg/dL — ABNORMAL LOW
Chloride: 99 mmol/L — ABNORMAL LOW
EGFR (African American): >60
GLUCOSE: 110 mg/dL — AB
Potassium: 4.2 mmol/L
SGOT(AST): 27 U/L
SGPT (ALT): 19 U/L
Sodium: 131 mmol/L — ABNORMAL LOW
TOTAL PROTEIN: 6.1 g/dL — AB

## 2015-03-19 LAB — CBC WITH DIFFERENTIAL/PLATELET
Basophil #: 0.1 10*3/uL (ref 0.0–0.1)
Basophil %: 1.3 %
Eosinophil #: 0.3 10*3/uL (ref 0.0–0.7)
Eosinophil %: 5.8 %
HCT: 37.3 % — ABNORMAL LOW (ref 40.0–52.0)
HGB: 12.7 g/dL — AB (ref 13.0–18.0)
LYMPHS PCT: 18.3 %
Lymphocyte #: 0.9 10*3/uL — ABNORMAL LOW (ref 1.0–3.6)
MCH: 31.2 pg (ref 26.0–34.0)
MCHC: 33.9 g/dL (ref 32.0–36.0)
MCV: 92 fL (ref 80–100)
MONOS PCT: 13 %
Monocyte #: 0.7 x10 3/mm (ref 0.2–1.0)
Neutrophil #: 3.2 10*3/uL (ref 1.4–6.5)
Neutrophil %: 61.6 %
Platelet: 193 10*3/uL (ref 150–440)
RBC: 4.05 10*6/uL — ABNORMAL LOW (ref 4.40–5.90)
RDW: 13.5 % (ref 11.5–14.5)
WBC: 5.2 10*3/uL (ref 3.8–10.6)

## 2015-03-19 LAB — TSH: Thyroid Stimulating Horm: 2.152 u[IU]/mL

## 2015-03-21 ENCOUNTER — Encounter
Admit: 2015-03-21 | Disposition: A | Discharge status: Home / Self Care | Payer: Self-pay | Attending: Internal Medicine | Admitting: Internal Medicine

## 2015-03-28 LAB — COMPREHENSIVE METABOLIC PANEL
ALBUMIN: 3.4 g/dL — AB
ANION GAP: 5 — AB (ref 7–16)
Alkaline Phosphatase: 62 U/L
BILIRUBIN TOTAL: 0.6 mg/dL
BUN: 22 mg/dL — ABNORMAL HIGH
CO2: 27 mmol/L
CREATININE: 0.75 mg/dL
Calcium, Total: 8.5 mg/dL — ABNORMAL LOW
Chloride: 95 mmol/L — ABNORMAL LOW
EGFR (African American): >60
EGFR (Non-African Amer.): >60
Glucose: 84 mg/dL
Potassium: 4.2 mmol/L
SGOT(AST): 24 U/L
SGPT (ALT): 19 U/L
SODIUM: 127 mmol/L — AB
Total Protein: 5.8 g/dL — ABNORMAL LOW

## 2015-03-28 LAB — CBC WITH DIFFERENTIAL/PLATELET
BASOS PCT: 1.3 %
Basophil #: 0.1 10*3/uL (ref 0.0–0.1)
Eosinophil #: 0.5 10*3/uL (ref 0.0–0.7)
Eosinophil %: 8.2 %
HCT: 35.8 % — ABNORMAL LOW (ref 40.0–52.0)
HGB: 12.1 g/dL — ABNORMAL LOW (ref 13.0–18.0)
Lymphocyte #: 1.4 10*3/uL (ref 1.0–3.6)
Lymphocyte %: 20.7 %
MCH: 31.1 pg (ref 26.0–34.0)
MCHC: 33.8 g/dL (ref 32.0–36.0)
MCV: 92 fL (ref 80–100)
Monocyte #: 0.9 x10 3/mm (ref 0.2–1.0)
Monocyte %: 14.1 %
NEUTROS PCT: 55.7 %
Neutrophil #: 3.7 10*3/uL (ref 1.4–6.5)
Platelet: 159 10*3/uL (ref 150–440)
RBC: 3.88 10*6/uL — AB (ref 4.40–5.90)
RDW: 13.4 % (ref 11.5–14.5)
WBC: 6.6 10*3/uL (ref 3.8–10.6)

## 2015-04-02 LAB — BASIC METABOLIC PANEL
Anion Gap: 9 (ref 7–16)
BUN: 16 mg/dL
CHLORIDE: 97 mmol/L — AB
CREATININE: 0.64 mg/dL
Calcium, Total: 8.6 mg/dL — ABNORMAL LOW
Co2: 26 mmol/L
EGFR (African American): >60
Glucose: 63 mg/dL — ABNORMAL LOW
POTASSIUM: 4.2 mmol/L
SODIUM: 132 mmol/L — AB

## 2015-04-17 ENCOUNTER — Encounter: Payer: Self-pay | Admitting: Cardiology

## 2015-04-17 ENCOUNTER — Ambulatory Visit (INDEPENDENT_AMBULATORY_CARE_PROVIDER_SITE_OTHER): Payer: Medicare Other | Admitting: Cardiology

## 2015-04-17 VITALS — BP 130/78 | HR 69 | Ht 68.0 in | Wt 162.0 lb

## 2015-04-17 DIAGNOSIS — I340 Nonrheumatic mitral (valve) insufficiency: Secondary | ICD-10-CM

## 2015-04-17 DIAGNOSIS — R55 Syncope and collapse: Secondary | ICD-10-CM | POA: Diagnosis not present

## 2015-04-17 DIAGNOSIS — I119 Hypertensive heart disease without heart failure: Secondary | ICD-10-CM

## 2015-04-17 NOTE — Progress Notes (Signed)
Cardiology Office Note   Date:  04/18/2015   ID:  Matthew Bright, DOB March 19, 1926, MRN [id number]  PCP:  Lelon Huh, MD  Cardiologist:   Darlin Coco, MD   No chief complaint on file.     History of Present Illness: Matthew Bright is a 81 y.o. male who presents for 6 month follow-up office visit  This pleasant 81 year old gentleman is seen for a scheduled followup office visit. He has a past history of essential hypertension and dyslipidemia. He has also had a history of previous syncopal spells possibly secondary to overmedication associated with some degree of dehydration. His family physician has worked him up for possible carotid artery disease. The patient has a history of dyslipidemia but is intolerant of high-dose statins because of the development of elevated CK levels. Since we last saw him he has developed worsening leg weakness and on his own he stopped taking Crestor altogether back in October 2014.  Since last visit he has had no new cardiac symptoms.  He has a past history of aortic valve disease with aortic insufficiency, and a history of mild to moderate mitral regurgitation. His last echocardiogram on 04/19/14 showed normal left ventricle systolic function with an ejection fraction of 60-65% and there was grade 1 diastolic dysfunction and mild to moderate mitral regurgitation.  Carotid Dopplers showed less than 39% stenosis bilaterally. Since his last visit he has had no further syncopal episodes.  However he did have an episode of urinary retention which caused him to wind up in the hospital for several days in [place].  He required an indwelling Foley catheter for a while. Patient is now living in an assisted living facility in [place].  They give him his medications and he is not sure about what medicine he is taking.  He states that he is no longer taking lisinopril however.  Past Medical History  Diagnosis Date  . Hyperlipidemia   . Enlarged prostate   .  Exertional dyspnea   . Syncope and collapse  2008; October 2012  . Hypertension   . Varicose veins   . Carotid artery disease     per CTA but no significant stenosis  . Allergy   . Hernia 2006  . Special screening for malignant neoplasms, colon   . Vertigo 2008  . Cerebellar stroke, acute 2008    denies residual on 07/08/2014  . OA (osteoarthritis)     "~ qwhere you can have it"  . Prostate cancer     pt denies this hx on 07/08/2014    Past Surgical History  Procedure Laterality Date  . Prostate biopsy  ~ 1990; ~ 2009  . Vein ligation and stripping Left 1953, 1968, 1980's; 1992  . Total hip arthroplasty Bilateral 2000-2001    Dr. Percell Miller  . Lumbar microdiscectomy  1999  . Hemorrhoid surgery  1980's  . Anterior cervical decomp/discectomy fusion  11/1998    C5-C6  . Cardiovascular stress test  11/13/2007    EF 67%, NO ISCHEMIA   . Colonoscopy  2008    Dr. Sharlett Iles in [place]  . Cataract extraction w/ intraocular lens  implant, bilateral Bilateral 2010  . Inguinal hernia repair Right 2007,2009  . Cardiac catheterization  02/2006    Cath at [facility]; no significant CAD noted.   . Vein ligation and stripping Right 1992     Current Outpatient Prescriptions  Medication Sig Dispense Refill  . aspirin EC 81 MG EC tablet Take 1 tablet (81 mg  total) by mouth daily.    . B Complex-Biotin-FA (B COMPLEX 100 TR) TBCR Take 1 tablet by mouth daily.     . Coenzyme Q10 (COQ10 PO) Take 1 capsule by mouth 2 (two) times daily. Taking 200mg  daily    . HYDROcodone-acetaminophen (NORCO/VICODIN) 5-325 MG per tablet Take 1-2 tablets by mouth every 4 (four) hours as needed for moderate pain or severe pain. 15 tablet 0  . methocarbamol (ROBAXIN) 500 MG tablet Take 1 tablet (500 mg total) by mouth 2 (two) times daily. 20 tablet 0  . Multiple Vitamins-Minerals (CENTRUM PO) Take 1 tablet by mouth daily.     . niacinamide 500 MG tablet Take 250 mg by mouth daily.     . Nutritional Supplements (JOINT FORMULA  PO) Take 2 tablets by mouth 2 (two) times daily.    . Probiotic Product (PROBIOTIC DAILY PO) Take 1 tablet by mouth daily.     No current facility-administered medications for this visit.    Allergies:   Avalide; Darvocet; Lovastatin; and Zocor    Social History:  The patient  reports that he has never smoked. He has never used smokeless tobacco. He reports that he does not drink alcohol or use illicit drugs.   Family History:  The patient's family history includes Heart attack in his father; Stroke in his father.    ROS:  Please see the history of present illness.   Otherwise, review of systems are positive for none.   All other systems are reviewed and negative.    PHYSICAL EXAM: VS:  BP 130/78 mmHg  Pulse 69  Ht 5\' 8"  (1.727 m)  Wt 162 lb (73.483 kg)  BMI 24.64 kg/m2 , BMI Body mass index is 24.64 kg/(m^2). GEN: Well nourished, well developed, in no acute distress HEENT: normal Neck: no JVD, carotid bruits, or masses Cardiac: RRR;  rubs, or gallops,no edema and a there is a grade 3/6 holosystolic murmur of mitral regurgitation at apex Respiratory:  clear to auscultation bilaterally, normal work of breathing GI: soft, nontender, nondistended, + BS MS: no deformity or atrophy Skin: warm and dry, no rash Neuro:  Strength and sensation are intact Psych: euthymic mood, full affect   EKG:  EKG is not ordered today.    Recent Labs: 07/08/2014: Potassium 4.1; Sodium 139; TSH 2.300 03/19/2015: ALT 19; BUN 21*; Creatinine 0.73; Hemoglobin 12.7*; Platelets 193    Lipid Panel    Component Value Date/Time   CHOL 156 07/09/2014 0005   TRIG 60 07/09/2014 0005   HDL 56 07/09/2014 0005   CHOLHDL 2.8 07/09/2014 0005   VLDL 12 07/09/2014 0005   LDLCALC 88 07/09/2014 0005      Wt Readings from Last 3 Encounters:  04/17/15 162 lb (73.483 kg)  12/08/14 151 lb (68.493 kg)  10/01/14 156 lb (70.761 kg)        ASSESSMENT AND PLAN:  1. essential hypertension 2.  Dyslipidemia 3. valvular heart disease with mild to moderate mitral regurgitation and mild aortic insufficiency. 4.  Remote history of repeat episodes of syncope secondary to vasovagal response 5. intolerance to statins with prior elevated CK levels, followed by his PCP 6. cervical spinal stenosis followed by Dr. Hal Neer  Plan: Continue same medication. Recheck in 6 months for followup office visit and EKG   Current medicines are reviewed at length with the patient today.  The patient does not have concerns regarding medicines.  The following changes have been made:  no change  Labs/ tests ordered today  include:  No orders of the defined types were placed in this encounter.       Signed, Darlin Coco, MD  04/18/2015 7:28 AM    [address], [place], [place]  [address] Phone: [phone]; Fax: [phone]

## 2015-04-17 NOTE — Patient Instructions (Signed)
Medication Instructions:  Your physician recommends that you continue on your current medications as directed. Please refer to the Current Medication list given to you today.  Labwork: NONE  Testing/Procedures: NONE  Follow-Up: Your physician wants you to follow-up in: 6 MONTH OV/EKG  You will receive a reminder letter in the mail two months in advance. If you don't receive a letter, please call our office to schedule the follow-up appointment.    

## 2015-04-20 ENCOUNTER — Encounter
Admission: RE | Admit: 2015-04-20 | Discharge: 2015-04-20 | Disposition: A | Discharge status: Home / Self Care | Payer: Medicare Other | Source: Ambulatory Visit | Attending: Internal Medicine | Admitting: Internal Medicine

## 2015-04-20 NOTE — Discharge Summary (Signed)
PATIENT NAME:  Matthew Bright, Matthew Bright MR#:  [id number] DATE OF BIRTH:  10/18/1926  DATE OF ADMISSION:  02/21/2015 DATE OF DISCHARGE:    PRESENTING COMPLAINT: Slurred speech, altered mental status.   DISCHARGE DIAGNOSES:  1.  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  2.  History of urinary retention.  3.  Hypertension.     4.  Generalized weakness.   CONDITION ON DISCHARGE: Fair.   CODE STATUS: Full code.   MEDICATIONS AT DISCHARGE:  1. Multivitamin p.o. daily.  2. Co-Q 100  mg p.o. daily.  3. Glucosamine p.o. daily.  4. Lisinopril 2.5 mg at bedtime.  5. Aspirin 81 mg daily.  6. Plavix 75 mg daily.  7. Flomax 0.4 mg p.o. daily.  8. Tylenol 650 q. 4 p.r.n.   CONSULTATIONS:  Neurology consultation with Dr. Irish Elders.   DISCHARGE INSTRUCTIONS:   1.  Continue physical therapy at rehabilitation.   2.  Follow up with your primary care physician after discharge from rehabilitation.   PRIMARY CARE PHYSICIAN:  Dr. Lelon Huh.   RADIOLOGICAL STUDIES: CT of the head and neck shows posterior circulation infarcts involving cerebellum bilaterally with left occipital lobe as described, there is a more remote infarct in the left occipital lobe. Occluded hypoplastic right vertebral artery at the level of C2 with reconstitution just below the foramen magnum, there is a tandem high grade stenosis just proximal to the right PICA.  At least 50% focal stenosis of the dominant left vertebral artery at C2 level. Focal high-grade stenosis of the proximal left P1 segment. Mild atherosclerotic changes at carotid bifurcation. Diffuse distal small vessel disease more apparent in the anterior MCA vessels on the right and distal left PCA branch vessels. 2.3 cm thyroid nodule. Creatinine is 0.79,  potassium is 3.4, sodium is 138. Lipid profile within normal limits. Hemoglobin A1c is 5.5. Echo Doppler showed EF of 50-55%, low normal global  left ventricular systolic filling. Impaired relaxation pattern of LV diastolic filling, moderately dilated left atrium, moderately dilated right atrium, mild to moderate mitral valve regurgitation, mild to moderate aortic regurgitation.  UA negative for UTI.  CBC within normal limits.   HOSPITAL COURSE:  [name] is a very pleasant 81 year old Caucasian gentleman with past medical history of hypertension, comes in with:   1.  Altered mental status/acute change in slurred speech. Workup showed the patient had cerebellar and occipital infarcts. He was continued on Plavix, aspirin was added. Neurology consultation was made. Echo and carotid Doppler ultrasound results as above.  2.  Urinary retention with history of possible BPH.  Continue Flomax.  3.  For hypertension the patient was resumed back on his lisinopril.  4.  Generalized weakness, deconditioning. Physical therapy saw the patient in the setting of new stroke, recommended rehabilitation for which the patient is going to be discharged to rehabilitation. He remained a full code.   TIME SPENT: 40 minutes.    ____________________________ Hart Rochester Posey Pronto, MD sap:bu D: 02/24/2015 12:33:00 ET T: 02/24/2015 12:48:49 ET JOB#: [id number]  cc: Kirstie Peri. Caryn Section, MD Annsley Akkerman A. Posey Pronto, MD, <Dictator>  Ilda Basset MD ELECTRONICALLY SIGNED 03/01/2015 14:27

## 2015-04-20 NOTE — H&P (Signed)
PATIENT NAME:  Matthew Bright, Matthew Bright MR#:  [id number] DATE OF BIRTH:  04/23/1926  DATE OF ADMISSION:  02/19/2015  PRIMARY CARE PHYSICIAN: Kirstie Peri. Caryn Section, MD  CHIEF COMPLAINT: Slurred speech, altered mental status.   HISTORY OF PRESENT ILLNESS: This is an 81 year old male who was brought into the hospital due to some slurred speech and altered mental status this morning. The patient himself is somewhat lethargic; therefore, most of the history obtained from the son at bedside. As per the son, the patient is usually really awake, alert, awake and oriented. This morning, the patient was noted to be more lethargic and also having some slurred speech. EMS was called and upon their arrival, the patient was also noted to have some lower abdominal pain, and noted to have significant urinary retention. He was brought to the Emergency Room and underwent a Foley catheter placement with greater than 1 L of urine removed. Post Foley catheter, the patient's mental status still remains somewhat lethargic with some mild slurred speech. The patient underwent a CT of the head, which showed a left parietal subacute/chronic stroke. Hospitalist services were contacted for further treatment and evaluation. The patient does admit to a headache presently, but denies any focal numbness, tingling, any upper or lower extremity weakness. Hospitalist services were contacted for further treatment and evaluation.   REVIEW OF SYSTEMS: CONSTITUTIONAL: No documented fever. No weight gain, no weight loss.  EYES: No blurred or double vision.  ENT: No tinnitus. No postnasal drip. No redness of the oropharynx.  RESPIRATORY: No cough, no wheeze, no hemoptysis, no dyspnea.  CARDIOVASCULAR: No chest pain, no orthopnea, no palpitation, no syncope.  GASTROINTESTINAL: No nausea. No vomiting. No diarrhea. No abdominal pain. No melena or hematochezia.  GENITOURINARY: No dysuria or hematuria.  ENDOCRINE: No polyuria or nocturia. No heat or cold  intolerance.  HEMATOLOGY: No anemia, no bruising, no bleeding.  INTEGUMENTARY: No rashes. No lesions.  MUSCULOSKELETAL: No arthritis. No swelling. No gout.  NEUROLOGIC: No numbness, no tingling. No ataxia. No seizure-type activity.  PSYCHIATRIC: No anxiety, no insomnia, no ADD.   PAST MEDICAL HISTORY: Consistent with a history of hypertension, history of vertigo.   ALLERGIES: TO DARVOCET.   SOCIAL HISTORY: No smoking. No alcohol abuse. No illicit drug abuse. Lives at home with his wife.   FAMILY HISTORY: Mother and father are both deceased. Father died from complications of an MI and CVA. Mother died from a heart attack.   CURRENT MEDICATIONS: As follows: Aspirin 81 mg daily, coenzyme Q 100 mg daily, glucosamine supplement 1 tab daily, lisinopril 2.5 mg at bedtime, meclizine 25 mg q.6 hours as needed, multivitamin daily.   PHYSICAL EXAMINATION: Presently is as follows:  VITAL SIGNS: Noted to be temperature is 97, pulse 61, respirations 18, blood pressure 150/79, saturations 99% on room air.  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 HEAD, EYES, EARS, NOSE, AND THROAT: He is atraumatic, normocephalic. Extraocular muscles are intact. Pupils equal and reactive to light. Sclerae are anicteric. No conjunctival injection. No pharyngeal erythema.  NECK: Supple. No jugular venous distention. No bruits, no lymphadenopathy, no thyromegaly.  HEART: Regular rate and rhythm. No murmur. The patient does have a 2 to 3 systolic ejection murmur heard at the base. No rubs, no clicks.  LUNGS: Clear to auscultation bilaterally. No rales, no rhonchi, no wheezes.  ABDOMEN: Soft, flat, nontender, nondistended. Has good bowel sounds. No hepatosplenomegaly appreciated.  EXTREMITIES: No evidence of any cyanosis, clubbing, or peripheral edema. Has +2  pedal and radial pulses bilaterally.  NEUROLOGIC: The patient is alert, awake, and oriented x3 with no focal motor or sensory deficits  appreciated bilaterally.  SKIN: Moist and warm with no rashes appreciated.  LYMPHATIC: There is no cervical or axillary lymphadenopathy.   LABORATORY DATA: Showed a serum glucose of 121, BUN 19, creatinine 0.7, sodium 135, potassium 3.8, chloride 101, bicarbonate 28. The patient's LFTs are within normal limits. White cell count 8.9, hemoglobin 14.1, hematocrit 41.6, platelet count 186,000. Urinalysis within normal limits.   The patient did have a CT of the head done without contrast, which showed low attenuation in the left posterior parietal lobe, most consistent with subacute versus chronic infarct.   ASSESSMENT AND PLAN: This is an 81 year old male with history of hypertension, history of vertigo who presents to the hospital due to slurred speech and altered mental status and also noted to have urinary retention.   1. Altered mental status/slurred speech. The exact etiology of this is unclear, but suspicious for a stroke, given the patient's CT head findings suggestive of a subacute chronic left parietal infarct. The patient still appears somewhat encephalopathic and lethargic, but has a nonfocal neurological exam. For now, observe the patient overnight. We will get an MRI of the brain without contrast, get a 2-dimensional echocardiogram, also a carotid duplex, follow q.4 hour neuro checks. We will get a physical therapy consult. He is on aspirin already. I will switch him to Plavix for now. If his MRI turns positive, I would consider getting a neurology consult.  2.  Urinary retention. The patient is status post a Foley catheter placement with greater than 1 L of urine removed. I will start her on Flomax. Check a postvoid residual in the morning.  3.  Hypertension. Continue lisinopril. 4.  The patient is a full code.   Time spent on admission is 45 minutes.   ____________________________ Belia Heman. Verdell Carmine, MD vjs:sw D: 02/19/2015 16:13:07 ET T: 02/19/2015 16:29:10  ET JOB#: [id number]  cc: Belia Heman. Verdell Carmine, MD, <Dictator> Henreitta Leber MD ELECTRONICALLY SIGNED 02/20/2015 19:14

## 2015-04-20 NOTE — Consult Note (Signed)
Referring Physician:  Henreitta Leber   Primary Care Physician:  [facility], [address], [place], [place] [address], [place] [phone]  Reason for Consult: Admit Date: 20-Feb-2015  Chief Complaint: dizziness  Reason for Consult: CVA   History of Present Illness: History of Present Illness:   seen at request of Dr. Volanda Napoleon for possible stroke;  81 yo RHD M with hx of vertigo presents to [facility] due to feeling dizzy.  Pt has hx of vertigo in the past but this episode is different.  Pt denies any nausea or vomiting.  He still feels off today.  This episode occured when he bent down but does not change with position like his previous hx of vertigo.  He denies headache.  Nothing makes it better or worse.  He did report some diplopia which has resolved.  No weakness or numbness was reported.  ROS:  General denies complaints   HEENT no complaints   Lungs no complaints   Cardiac no complaints   GI no complaints   GU no complaints   Musculoskeletal no complaints   Extremities no complaints   Skin no complaints   Neuro dizziness, diplopia   Endocrine no complaints   Psych no complaints   Past Medical/Surgical Hx:  CVA:   vertigo:   HTN:   Vericose vein surgery:   Neck surgery:   Lower Back surgery:   Bilateral hip replacement:   Past Medical/ Surgical Hx:  Past Medical History reviewed by me as above   Past Surgical History reviewed by me as above   Home Medications: Medication Instructions Last Modified Date/Time  lisinopril 5 mg oral tablet 0.5 tab(s) orally once a day (at bedtime) 02-Mar-16 13:05  meclizine 25 mg oral tablet 1 tab(s) orally every 6 hours, As Needed for dizziness 02-Mar-16 13:05  aspirin 81 mg oral tablet 1 tab(s) orally once a day 02-Mar-16 13:05  multivitamin 1 tab(s) orally once a day 02-Mar-16 13:05  Co Q-10 100 mg oral capsule 1 cap(s) orally once a day 02-Mar-16 13:05  glucosamine 1 dose(s) orally once a  day 02-Mar-16 13:05  joint advantage 1 tab(s) orally once a day 02-Mar-16 13:05   Allergies:  Darvocet - N: Other  Allergies:  Allergies darvocet   Social/Family History: Employment Status: retired  Lives With: alone  Social History: no tob, no EtOH, no illicits  Family History: no seizures, no strokes   Physical Exam: General: nl weight, NAD  HEENT: normocephalic, sclera nonicteric, oropharynx clear  Neck: supple, no JVD, no bruits  Chest: CTA B, no wheezing  Cardiac: RRR, no murmurs, no edema, 2+ pulses  Extremities: no C/C/E, FROM   Neurologic Exam: Mental Status: alert and oriented x 3, normal speech and language, follows complex commands  Cranial Nerves: PERRLA, EOMI, L homonymous hemianopsia, face symmetric, tongue midline, shoulder shrug equal  Motor Exam: 5/5 B normal, tone, no tremor  Deep Tendon Reflexes: 1+/4 B, plantars downgoing B, no Hoffman  Sensory Exam: pinprick, temperature, and vibration intact B  Coordination: mild R dysmmetria   Lab Results: LabObservation:  02-Mar-16 18:05   OBSERVATION Reason for Test  Hepatic:  02-Mar-16 11:36   Bilirubin, Total 0.6  Alkaline Phosphatase 71  SGPT (ALT) 43  SGOT (AST)  53  Total Protein, Serum 7.2  Albumin, Serum 3.9  Routine Chem:  02-Mar-16 11:36   Glucose, Serum  121  BUN  19  Creatinine (comp) 0.75  Sodium, Serum  135  Potassium,  Serum 3.8  Chloride, Serum 101  CO2, Serum 28  Calcium (Total), Serum 8.8  Osmolality (calc) 274  eGFR (African American) >60  eGFR (Non-African American) >60 (eGFR values <75m/min/1.73 m2 may be an indication of chronic kidney disease (CKD). Calculated eGFR, using the MRDR Study equation, is useful in  patients with stable renal function. The eGFR calculation will not be reliable in acutely ill patients when serum creatinine is changing rapidly. It is not useful in patients on dialysis. The eGFR calculation may not be applicable to patients at the low and high  extremes of body sizes, pregnant women, and vegetarians.)  Anion Gap  6  Routine UA:  02-Mar-16 11:37   Color (UA) Straw  Clarity (UA) Clear  Glucose (UA) Negative  Bilirubin (UA) Negative  Ketones (UA) Negative  Specific Gravity (UA) 1.009  Blood (UA) 1+  pH (UA) 8.0  Protein (UA) Negative  Nitrite (UA) Negative  Leukocyte Esterase (UA) Negative (Result(s) reported on 19 Feb 2015 at 12:12PM.)  RBC (UA) 45 /HPF  WBC (UA) <1 /HPF  Bacteria (UA) NONE SEEN  Epithelial Cells (UA) NONE SEEN  Result(s) reported on 19 Feb 2015 at 12:12PM.  Routine Hem:  02-Mar-16 11:36   WBC (CBC) 8.9  RBC (CBC) 4.46  Hemoglobin (CBC) 14.1  Hematocrit (CBC) 41.6  Platelet Count (CBC) 186 (Result(s) reported on 19 Feb 2015 at 12:07PM.)  MCV 93  MCH 31.6  MCHC 33.9  RDW 14.1   Radiology Results: [place]    03-Mar-16 16:26, [place] Doppler Bilateral  [place] Bilateral   REASON FOR EXAM:    Acute CVA.  COMMENTS:       PROCEDURE: [place] - [place] DOPPLER BILATERAL  - Feb 20 2015  4:26PM     CLINICAL DATA:  Acute stroke    EXAM:  BILATERAL CAROTID DUPLEX ULTRASOUND    TECHNIQUE:  GPearline Cablesscale imaging, color Doppler and duplex ultrasound were  performed of bilateral carotid and vertebral arteries in the neck.    COMPARISON:  None.  FINDINGS:  Criteria: Quantification of carotid stenosis is based on velocity  parameters that correlate the residual internal carotid diameter  with NASCET-based stenosis levels, using the diameter of the distal  internal carotid lumen as the denominator for stenosis measurement.    The following velocity measurements were obtained:    RIGHT    ICA:  56 cm/sec    CCA:  66 cm/sec    SYSTOLIC ICA/CCA RATIO:  0.9  DIASTOLIC ICA/CCA RATIO:    ECA:  95 cm/sec    LEFT    ICA:  72 cm/sec    CCA:  87 cm/sec    SYSTOLIC ICA/CCA RATIO:  0.8    DIASTOLIC ICA/CCA RATIO:    ECA:  160 cm/sec  RIGHT CAROTID ARTERY: Scattered calcified plaque in  the bulb and  internal carotid artery. Low resistance internal carotid Doppler  pattern.    RIGHT VERTEBRAL ARTERY:  Antegrade with poor diastolic flow.    LEFT CAROTID ARTERY: There is scattered mild calcified plaque in the  left bulb and internal carotid artery. Low resistance internal  carotid Doppler pattern.    LEFT VERTEBRAL ARTERY: Antegrade with a normal-appearing Doppler  pattern.     IMPRESSION:  Less than 50% stenosis in the right and left internal carotid  arteries.    Normal flow in theleft vertebral artery.    Poor diastolic flow in the right vertebral artery which raises the  possibility of  a significant stenosis. CT angiogram of the neck may  be helpful.      Electronically Signed    By: Marybelle Killings M.D.    On: 02/20/2015 16:33        Verified By: Jamas Lav, M.D.,  MRI:    03-Mar-16 16:25, MRI Brain Without Contrast  MRI Brain Without Contrast   REASON FOR EXAM:    Acute CVA.  COMMENTS:       PROCEDURE: MR  - MR BRAIN WO CONTRAST  - Feb 20 2015  4:25PM     CLINICAL DATA:  Dizziness and balance problems for 2 weeks. Slurred  speech. Confusion. Acute CVA.    EXAM:  MRI HEAD WITHOUT CONTRAST    TECHNIQUE:  Multiplanar, multiecho pulse sequences of the brain and surrounding  structures were obtained without intravenous contrast.  COMPARISON:  CT head without contrast 02/19/2015. MRI of the  cervical spine 02/23/2005    FINDINGS:  The diffusion-weighted images demonstrate multiple punctate foci of  restricted diffusion throughout both cerebellar hemispheres. There  scattered foci of restricted diffusion within the medial left  occipital lobe. A more remote infarct is present in the left  occipital pole as was noted on the CT scan.    Moderate atrophy and confluent white matter changes are evident  bilaterally.    T2 changes are associated with a multiple foci of acute infarction.  There more remote lacunar infarcts within the cerebellum  bilaterally  as well. White matter changes extend into the brainstem. There are  remote lacunar infarcts involving the caudate heads bilaterally.    The ventricles are of normal size.  Insert pass fluid    Flow is present in the major intracranial arteries. Bilateral lens  replacements are noted. The skullbase is unremarkable.    Mild mucosal thickening is present in the maxillary sinuses and  sphenoid sinuses bilaterally. There is scattered opacification of  ethmoid air cells. Mucosal thickening is noted in the left frontal  sinus.    Mastoid air cells are clear bilaterally.  Midline images of the brain are unremarkable. Soft tissue posterior  to the dens results an significant distortion of the cord at the  craniocervical junction. There is significant retrolisthesis at C1-2  which contributes. Abnormal signal in a right vertebral artery is  compatible with occlusion. The left vertebral artery is patent. Flow  is present in the anterior circulation.     IMPRESSION:  1. Multiple acute nonhemorrhagic infarcts within the cerebellum  bilaterally and medial left occipital lobe.  2. Remote encephalomalacia of the more posterior left occipital  lobe.  3. Moderate atrophy and white matter disease bilaterally compatible  with chronic microvascular ischemia.  4. Remote lacunar infarcts in the caudate heads bilaterally.  5. Stenosis at the foramen magnum secondary to degenerative  retrolisthesis at C1-2 and prominent soft tissue posterior to the  dens which results in some distortion of the spinal cord.  6. The narrowing in distortion may be related to occlusion of the  right vertebral artery. This could be related to the posterior  circulation infarcts.  These results will be called to the ordering clinician or  representative by the Radiologist Assistant, and communication  documented in the PACS or zVision Dashboard.      Electronically Signed    By: San Morelle M.D.     On: 02/20/2015 16:35     Verified By: Resa Miner. MATTERN, M.D.,  CT:    02-Mar-16 14:25, CT Head  Without Contrast  CT Head Without Contrast   REASON FOR EXAM:    ams  COMMENTS:       PROCEDURE: CT  - CT HEAD WITHOUT CONTRAST  - Feb 19 2015  2:25PM     CLINICAL DATA:  Dizziness.  Abdominal pain.    EXAM:  CT HEAD WITHOUT CONTRAST    TECHNIQUE:  Contiguous axial images were obtained from thebase of the skull  through the vertex without intravenous contrast.    COMPARISON:  None.  FINDINGS:  There is no evidence of mass effect, midline shift, or extra-axial  fluid collections. There is no evidence of a space-occupying lesion  or intracranial hemorrhage. There is no evidence of a cortical-based  area of acute infarction. Bilateral Old cerebellar infarcts. Low  attenuation in the left posterior parietal lobe. There is  generalized cerebral atrophy. There is periventricular white matter  low attenuation likely secondary to microangiopathy.    The ventricles and sulci are appropriate for the patient's age. The  basal cisterns are patent.    Visualized portions of the orbits are unremarkable. The visualized  portions of the paranasal sinuses and mastoid air cells are  unremarkable. Cerebrovascular atherosclerotic calcifications are  noted.    The osseous structures are unremarkable.     IMPRESSION:  1. Low-attenuation in the left posterior parietal lobe most  consistent with a subacute versus chronic infarct.  2. No acute intracranial pathology.      Electronically Signed    By: Kathreen Devoid    On: 02/19/2015 15:14       Verified By: Jennette Banker, M.D., MD   Radiology Impression: Radiology Impression: MRI personally reviewed by me and shows L occipital and bilateral cerebellar infarcts, mild white matter disease   Impression/Recommendations: Recommendations:   prior notes reviewed by me reviewed by me   Posterior circulation strokes-  there could be some  vertebrobasilar dysfunction or it could be cardioembolic;  symptoms have improved but they are not resolved CTA of head and neck check lipids and Hem A1c continue Plavix 64m daily permissive HTN for now will need Loop recorder if CTA is neg will follow  Electronic Signatures: SJamison Neighbor(MD)  (Signed 04-Mar-16 03:57)  Authored: REFERRING PHYSICIAN, Primary Care Physician, Consult, History of Present Illness, Review of Systems, PAST MEDICAL/SURGICAL HISTORY, HOME MEDICATIONS, ALLERGIES, Social/Family History, Physical Exam-, LAB RESULTS, RADIOLOGY RESULTS, Recommendations   Last Updated: 04-Mar-16 03:57 by SJamison Neighbor(MD)

## 2015-06-16 ENCOUNTER — Other Ambulatory Visit: Payer: Self-pay

## 2015-08-18 ENCOUNTER — Other Ambulatory Visit: Payer: Self-pay

## 2015-08-18 ENCOUNTER — Emergency Department: Payer: Medicare Other

## 2015-08-18 ENCOUNTER — Emergency Department
Admission: EM | Admit: 2015-08-18 | Discharge: 2015-08-18 | Disposition: A | Discharge status: Home / Self Care | Payer: Medicare Other | Attending: Emergency Medicine | Admitting: Emergency Medicine

## 2015-08-18 DIAGNOSIS — I10 Essential (primary) hypertension: Secondary | ICD-10-CM | POA: Diagnosis not present

## 2015-08-18 DIAGNOSIS — R55 Syncope and collapse: Secondary | ICD-10-CM | POA: Insufficient documentation

## 2015-08-18 LAB — URINALYSIS COMPLETE WITH MICROSCOPIC (ARMC ONLY)
Bacteria, UA: NONE SEEN
Bilirubin Urine: NEGATIVE
GLUCOSE, UA: NEGATIVE mg/dL
Hgb urine dipstick: NEGATIVE
KETONES UR: NEGATIVE mg/dL
Leukocytes, UA: NEGATIVE
Nitrite: NEGATIVE
PH: 7 (ref 5.0–8.0)
Protein, ur: NEGATIVE mg/dL
SQUAMOUS EPITHELIAL / LPF: NONE SEEN
Specific Gravity, Urine: 1.011 (ref 1.005–1.030)

## 2015-08-18 LAB — COMPREHENSIVE METABOLIC PANEL
ALT: 20 U/L (ref 17–63)
AST: 33 U/L (ref 15–41)
Albumin: 3.5 g/dL (ref 3.5–5.0)
Alkaline Phosphatase: 62 U/L (ref 38–126)
Anion gap: 10 (ref 5–15)
BILIRUBIN TOTAL: 0.5 mg/dL (ref 0.3–1.2)
BUN: 25 mg/dL — ABNORMAL HIGH (ref 6–20)
CO2: 25 mmol/L (ref 22–32)
CREATININE: 0.79 mg/dL (ref 0.61–1.24)
Calcium: 8.6 mg/dL — ABNORMAL LOW (ref 8.9–10.3)
Chloride: 93 mmol/L — ABNORMAL LOW (ref 101–111)
GFR calc Af Amer: >60 mL/min (ref >60)
GLUCOSE: 99 mg/dL (ref 65–99)
Potassium: 3.2 mmol/L — ABNORMAL LOW (ref 3.5–5.1)
Sodium: 128 mmol/L — ABNORMAL LOW (ref 135–145)
TOTAL PROTEIN: 6 g/dL — AB (ref 6.5–8.1)

## 2015-08-18 LAB — CBC
HEMATOCRIT: 36.2 % — AB (ref 40.0–52.0)
Hemoglobin: 12.5 g/dL — ABNORMAL LOW (ref 13.0–18.0)
MCH: 30.8 pg (ref 26.0–34.0)
MCHC: 34.5 g/dL (ref 32.0–36.0)
MCV: 89.3 fL (ref 80.0–100.0)
PLATELETS: 197 10*3/uL (ref 150–440)
RBC: 4.05 MIL/uL — AB (ref 4.40–5.90)
RDW: 13.4 % (ref 11.5–14.5)
WBC: 6.4 10*3/uL (ref 3.8–10.6)

## 2015-08-18 LAB — TROPONIN I: Troponin I: 0.04 ng/mL — ABNORMAL HIGH (ref <0.031)

## 2015-08-18 MED ORDER — SODIUM CHLORIDE 0.9 % IV SOLN
Freq: Once | INTRAVENOUS | Status: AC
  Administered 2015-08-18: 09:00:00 via INTRAVENOUS

## 2015-08-18 NOTE — ED Provider Notes (Signed)
[facility] Emergency Department Provider Note     Time seen: ----------------------------------------- 9:07 AM on 08/18/2015 -----------------------------------------    I have reviewed the triage vital signs and the nursing notes.   HISTORY  Chief Complaint Loss of Consciousness    HPI Matthew Bright is a 81 y.o. male found after a syncopal event at the breakfast table. Patient is currently at a nursing facility, was found slumped over while eating breakfast. On arrival patient was found hypotensive and bradycardic, EMS gave him saline bolus and he has subsequently improved. Currently denies complaints, states this is happened to him before.   Past Medical History  Diagnosis Date  . Hyperlipidemia   . Enlarged prostate   . Exertional dyspnea   . Syncope and collapse  2008; October 2012  . Hypertension   . Varicose veins   . Carotid artery disease     per CTA but no significant stenosis  . Allergy   . Hernia 2006  . Special screening for malignant neoplasms, colon   . Vertigo 2008  . Cerebellar stroke, acute 2008    denies residual on 07/08/2014  . OA (osteoarthritis)     "~ qwhere you can have it"  . Prostate cancer     pt denies this hx on 07/08/2014    Patient Active Problem List   Diagnosis Date Noted  . Mitral regurgitation 04/17/2015  . TIA (transient ischemic attack) 07/08/2014  . Aortic valve disease 04/05/2014  . Asymptomatic varicose veins 08/16/2013  . Hyposmolality and/or hyponatremia 04/02/2013  . Syncope, vasovagal 12/08/2011  . Hypercholesterolemia 11/30/2011  . HTN (hypertension) 10/22/2011  . Abnormal EKG 10/22/2011    Past Surgical History  Procedure Laterality Date  . Prostate biopsy  ~ 1990; ~ 2009  . Vein ligation and stripping Left 1953, 1968, 1980's; 1992  . Total hip arthroplasty Bilateral 2000-2001    Dr. Percell Miller  . Lumbar microdiscectomy  1999  . Hemorrhoid surgery  1980's  . Anterior cervical  decomp/discectomy fusion  11/1998    C5-C6  . Cardiovascular stress test  11/13/2007    EF 67%, NO ISCHEMIA   . Colonoscopy  2008    Dr. Sharlett Iles in [place]  . Cataract extraction w/ intraocular lens  implant, bilateral Bilateral 2010  . Inguinal hernia repair Right 2007,2009  . Cardiac catheterization  02/2006    Cath at [facility]; no significant CAD noted.   . Vein ligation and stripping Right 1992    Allergies Avalide; Darvocet; Lovastatin; and Zocor  Social History Social History  Substance Use Topics  . Smoking status: Never Smoker   . Smokeless tobacco: Never Used  . Alcohol Use: No    Review of Systems Constitutional: Negative for fever. Eyes: Negative for visual changes. ENT: Negative for sore throat. Cardiovascular: Negative for chest pain. Respiratory: Negative for shortness of breath. Gastrointestinal: Negative for abdominal pain, vomiting and diarrhea. Genitourinary: Negative for dysuria. Musculoskeletal: Negative for back pain. Skin: Negative for rash. Neurological: Negative for headaches, focal weakness or numbness.  10-point ROS otherwise negative.  ____________________________________________   PHYSICAL EXAM:  VITAL SIGNS: ED Triage Vitals  Enc Vitals Group     BP --      Pulse --      Resp --      Temp --      Temp src --      SpO2 --      Weight --      Height --  Head Cir --      Peak Flow --      Pain Score --      Pain Loc --      Pain Edu? --      Excl. in [place]? --     Constitutional: Alert and oriented. Well appearing and in no distress. Eyes: Conjunctivae are normal. PERRL. Normal extraocular movements. ENT   Head: Normocephalic and atraumatic.   Nose: No congestion/rhinnorhea.   Mouth/Throat: Mucous membranes are moist.   Neck: No stridor. Cardiovascular: Normal rate, regular rhythm. Normal and symmetric distal pulses are present in all extremities. No murmurs, rubs, or gallops. Respiratory: Normal respiratory  effort without tachypnea nor retractions. Breath sounds are clear and equal bilaterally. No wheezes/rales/rhonchi. Gastrointestinal: Soft and nontender. No distention. No abdominal bruits.  Musculoskeletal: Nontender with normal range of motion in all extremities. No joint effusions.  No lower extremity tenderness nor edema. Neurologic:  Normal speech and language. No gross focal neurologic deficits are appreciated. Speech is normal.  Skin:  Skin is warm, dry and intact. No rash noted. Psychiatric: Mood and affect are normal. Speech and behavior are normal. Patient exhibits appropriate insight and judgment. ____________________________________________  EKG: Interpreted by me. Undetermined rhythm, rate is 66 bpm, left anterior fascicular block, LVH. Nonspecific ST and T-wave changes  ____________________________________________  ED COURSE:  Pertinent labs & imaging results that were available during my care of the patient were reviewed by me and considered in my medical decision making (see chart for details). Patient a cardiac labs, EKG and reevaluation. We'll continue a liter of saline bolus ____________________________________________    LABS (pertinent positives/negatives)  Labs Reviewed  CBC - Abnormal; Notable for the following:    RBC 4.05 (*)    Hemoglobin 12.5 (*)    HCT 36.2 (*)    All other components within normal limits  TROPONIN I - Abnormal; Notable for the following:    Troponin I 0.04 (*)    All other components within normal limits  COMPREHENSIVE METABOLIC PANEL - Abnormal; Notable for the following:    Sodium 128 (*)    Potassium 3.2 (*)    Chloride 93 (*)    BUN 25 (*)    Calcium 8.6 (*)    Total Protein 6.0 (*)    All other components within normal limits  URINALYSIS COMPLETEWITH MICROSCOPIC ([facility] ONLY) - Abnormal; Notable for the following:    Color, Urine YELLOW (*)    APPearance HAZY (*)    All other components within normal limits     RADIOLOGY Images were viewed by me  Chest x-ray IMPRESSION: No acute disease. ____________________________________________  FINAL ASSESSMENT AND PLAN  Syncope  Plan: Patient with labs and imaging as dictated above. Patient with a long history of vasovagal syncope. Troponin is consistent with prior a 0.04. Patient is without complaint, alert and oriented. Was given saline here to correct mild hyponatremia. He is stable for outpatient follow-up   Earleen Newport, MD   Earleen Newport, MD 08/18/15 [phone]

## 2015-08-18 NOTE — Discharge Instructions (Signed)
Syncope °Syncope is a medical term for fainting or passing out. This means you lose consciousness and drop to the ground. People are generally unconscious for less than 5 minutes. You may have some muscle twitches for up to 15 seconds before waking up and returning to normal. Syncope occurs more often in older adults, but it can happen to anyone. While most causes of syncope are not dangerous, syncope can be a sign of a serious medical problem. It is important to seek medical care.  °CAUSES  °Syncope is caused by a sudden drop in blood flow to the brain. The specific cause is often not determined. Factors that can bring on syncope include: °· Taking medicines that lower blood pressure. °· Sudden changes in posture, such as standing up quickly. °· Taking more medicine than prescribed. °· Standing in one place for too long. °· Seizure disorders. °· Dehydration and excessive exposure to heat. °· Low blood sugar (hypoglycemia). °· Straining to have a bowel movement. °· Heart disease, irregular heartbeat, or other circulatory problems. °· Fear, emotional distress, seeing blood, or severe pain. °SYMPTOMS  °Right before fainting, you may: °· Feel dizzy or light-headed. °· Feel nauseous. °· See all white or all black in your field of vision. °· Have cold, clammy skin. °DIAGNOSIS  °Your health care provider will ask about your symptoms, perform a physical exam, and perform an electrocardiogram (ECG) to record the electrical activity of your heart. Your health care provider may also perform other heart or blood tests to determine the cause of your syncope which may include: °· Transthoracic echocardiogram (TTE). During echocardiography, sound waves are used to evaluate how blood flows through your heart. °· Transesophageal echocardiogram (TEE). °· Cardiac monitoring. This allows your health care provider to monitor your heart rate and rhythm in real time. °· Holter monitor. This is a portable device that records your  heartbeat and can help diagnose heart arrhythmias. It allows your health care provider to track your heart activity for several days, if needed. °· Stress tests by exercise or by giving medicine that makes the heart beat faster. °TREATMENT  °In most cases, no treatment is needed. Depending on the cause of your syncope, your health care provider may recommend changing or stopping some of your medicines. °HOME CARE INSTRUCTIONS °· Have someone stay with you until you feel stable. °· Do not drive, use machinery, or play sports until your health care provider says it is okay. °· Keep all follow-up appointments as directed by your health care provider. °· Lie down right away if you start feeling like you might faint. Breathe deeply and steadily. Wait until all the symptoms have passed. °· Drink enough fluids to keep your urine clear or pale yellow. °· If you are taking blood pressure or heart medicine, get up slowly and take several minutes to sit and then stand. This can reduce dizziness. °SEEK IMMEDIATE MEDICAL CARE IF:  °· You have a severe headache. °· You have unusual pain in the chest, abdomen, or back. °· You are bleeding from your mouth or rectum, or you have black or tarry stool. °· You have an irregular or very fast heartbeat. °· You have pain with breathing. °· You have repeated fainting or seizure-like jerking during an episode. °· You faint when sitting or lying down. °· You have confusion. °· You have trouble walking. °· You have severe weakness. °· You have vision problems. °If you fainted, call your local emergency services (911 in [place] Do not drive   yourself to the hospital.  °MAKE SURE YOU: °· Understand these instructions. °· Will watch your condition. °· Will get help right away if you are not doing well or get worse. °Document Released: 12/06/2005 Document Revised: 12/11/2013 Document Reviewed: 02/04/2012 °ExitCare® Patient Information ©2015 [employer]. This information is not intended to replace  advice given to you by your health care provider. Make sure you discuss any questions you have with your health care provider. ° °

## 2015-08-18 NOTE — ED Notes (Signed)
Pt comes into the ED via EMS from independent living at [facility]states he was sitting eating breakfast and had a syncople episode..denies pain..states he was not feeling well last night, denies nausea or pain, states just felt weak.the patient is in NAD on arrival, a/ox3.Matthew Bright

## 2015-10-16 ENCOUNTER — Telehealth: Payer: Self-pay

## 2015-10-16 NOTE — Telephone Encounter (Signed)
This is what we got a call about the other day. Can you let patient know what this is please?-aa

## 2015-10-16 NOTE — Telephone Encounter (Signed)
Patient stated received a phone call regarding asking if he had fallen in the past year. Do you know anything about this? His number is [phone]  Thanks,  -Brandon Wiechman

## 2015-10-17 NOTE — Telephone Encounter (Signed)
I called pt at number below and was unable to reach or leave message.  This is a call coming from [facility] doing a fall risk assessment for patients in the [facility]

## 2016-02-25 ENCOUNTER — Encounter: Payer: Self-pay | Admitting: *Deleted

## 2016-02-27 ENCOUNTER — Emergency Department
Admission: EM | Admit: 2016-02-27 | Discharge: 2016-02-27 | Disposition: A | Discharge status: Home / Self Care | Payer: Medicare Other | Attending: Emergency Medicine | Admitting: Emergency Medicine

## 2016-02-27 ENCOUNTER — Encounter: Payer: Self-pay | Admitting: Emergency Medicine

## 2016-02-27 DIAGNOSIS — I10 Essential (primary) hypertension: Secondary | ICD-10-CM | POA: Diagnosis not present

## 2016-02-27 DIAGNOSIS — Z7982 Long term (current) use of aspirin: Secondary | ICD-10-CM | POA: Diagnosis not present

## 2016-02-27 DIAGNOSIS — E876 Hypokalemia: Secondary | ICD-10-CM | POA: Insufficient documentation

## 2016-02-27 DIAGNOSIS — Z79899 Other long term (current) drug therapy: Secondary | ICD-10-CM | POA: Insufficient documentation

## 2016-02-27 DIAGNOSIS — R55 Syncope and collapse: Secondary | ICD-10-CM

## 2016-02-27 DIAGNOSIS — Z7902 Long term (current) use of antithrombotics/antiplatelets: Secondary | ICD-10-CM | POA: Insufficient documentation

## 2016-02-27 DIAGNOSIS — Z7952 Long term (current) use of systemic steroids: Secondary | ICD-10-CM | POA: Insufficient documentation

## 2016-02-27 DIAGNOSIS — E871 Hypo-osmolality and hyponatremia: Secondary | ICD-10-CM | POA: Insufficient documentation

## 2016-02-27 LAB — GLUCOSE, CAPILLARY: Glucose-Capillary: 97 mg/dL (ref 65–99)

## 2016-02-27 LAB — URINALYSIS COMPLETE WITH MICROSCOPIC (ARMC ONLY)
BILIRUBIN URINE: NEGATIVE
Bacteria, UA: NONE SEEN
Glucose, UA: NEGATIVE mg/dL
Hgb urine dipstick: NEGATIVE
KETONES UR: NEGATIVE mg/dL
LEUKOCYTES UA: NEGATIVE
NITRITE: NEGATIVE
PH: 7 (ref 5.0–8.0)
Protein, ur: NEGATIVE mg/dL
SPECIFIC GRAVITY, URINE: 1.015 (ref 1.005–1.030)

## 2016-02-27 LAB — BASIC METABOLIC PANEL
Anion gap: 7 (ref 5–15)
BUN: 23 mg/dL — ABNORMAL HIGH (ref 6–20)
CO2: 28 mmol/L (ref 22–32)
CREATININE: 0.78 mg/dL (ref 0.61–1.24)
Calcium: 8.5 mg/dL — ABNORMAL LOW (ref 8.9–10.3)
Chloride: 94 mmol/L — ABNORMAL LOW (ref 101–111)
GFR calc Af Amer: >60 mL/min (ref >60)
GFR calc non Af Amer: >60 mL/min (ref >60)
Glucose, Bld: 146 mg/dL — ABNORMAL HIGH (ref 65–99)
POTASSIUM: 3 mmol/L — AB (ref 3.5–5.1)
SODIUM: 129 mmol/L — AB (ref 135–145)

## 2016-02-27 LAB — CBC
HEMATOCRIT: 34.4 % — AB (ref 40.0–52.0)
HEMOGLOBIN: 12.1 g/dL — AB (ref 13.0–18.0)
MCH: 31 pg (ref 26.0–34.0)
MCHC: 35.1 g/dL (ref 32.0–36.0)
MCV: 88.5 fL (ref 80.0–100.0)
PLATELETS: 198 10*3/uL (ref 150–440)
RBC: 3.89 MIL/uL — AB (ref 4.40–5.90)
RDW: 13.4 % (ref 11.5–14.5)
WBC: 7.1 10*3/uL (ref 3.8–10.6)

## 2016-02-27 LAB — TROPONIN I: Troponin I: 0.03 ng/mL (ref <0.031)

## 2016-02-27 MED ORDER — MAGNESIUM SULFATE 2 GM/50ML IV SOLN
2.0000 g | Freq: Once | INTRAVENOUS | Status: AC
  Administered 2016-02-27: 2 g via INTRAVENOUS
  Filled 2016-02-27: qty 50

## 2016-02-27 MED ORDER — SODIUM CHLORIDE 0.9 % IV BOLUS (SEPSIS)
500.0000 mL | Freq: Once | INTRAVENOUS | Status: AC
Start: 2016-02-27 — End: 2016-02-27
  Administered 2016-02-27: 500 mL via INTRAVENOUS

## 2016-02-27 MED ORDER — POTASSIUM CHLORIDE ER 10 MEQ PO TBCR: 10.0000 meq | EXTENDED_RELEASE_TABLET | Freq: Every day | ORAL | Status: AC

## 2016-02-27 MED ORDER — POTASSIUM CHLORIDE 20 MEQ/15ML (10%) PO SOLN
40.0000 meq | Freq: Once | ORAL | Status: AC
  Administered 2016-02-27: 40 meq via ORAL
  Filled 2016-02-27: qty 30

## 2016-02-27 NOTE — ED Provider Notes (Addendum)
[facility] Emergency Department Provider Note  ____________________________________________  Time seen: Approximately 10:28 AM  I have reviewed the triage vital signs and the nursing notes.   HISTORY  Chief Complaint Loss of Consciousness    HPI Matthew Bright is a 82 y.o. male presents for evaluation after passing out.  Patient reports that he got up, was having breakfast when he started to feel like his blood pressure was dropping. He reports feeling slightly dizzy, and then reports that he woke up and was told by staff that he passed out. He did not fall and denies any injury or headache. He did not have any chest pain or trouble breathing proceeding. He reports that for a few seconds before he started to feel lightheaded and this is happened before when he has had "neurogenic" passing out. Reports he's had the same with eating or drinking liquids especially at breakfast time in the past.  Now he currently reports that he feels fine. He has no symptoms or concerns.  No chest pain, no trouble breathing. No headache, numbness, weakness.  Past Medical History  Diagnosis Date  . Hyperlipidemia   . Enlarged prostate   . Exertional dyspnea   . Syncope and collapse  2008; October 2012  . Hypertension   . Varicose veins   . Carotid artery disease ([place])     per CTA but no significant stenosis  . Allergy   . Hernia 2006  . Special screening for malignant neoplasms, colon   . Vertigo 2008  . Cerebellar stroke, acute ([place]) 2008    denies residual on 07/08/2014  . OA (osteoarthritis)     "~ qwhere you can have it"  . Prostate cancer [facility])     pt denies this hx on 07/08/2014    Patient Active Problem List   Diagnosis Date Noted  . Mitral regurgitation 04/17/2015  . TIA (transient ischemic attack) 07/08/2014  . Aortic valve disease 04/05/2014  . Asymptomatic varicose veins 08/16/2013  . Hyposmolality and/or hyponatremia 04/02/2013  . Syncope, vasovagal  12/08/2011  . Hypercholesterolemia 11/30/2011  . HTN (hypertension) 10/22/2011  . Abnormal EKG 10/22/2011    Past Surgical History  Procedure Laterality Date  . Prostate biopsy  ~ 1990; ~ 2009  . Vein ligation and stripping Left 1953, 1968, 1980's; 1992  . Total hip arthroplasty Bilateral 2000-2001    Dr. Percell Miller  . Lumbar microdiscectomy  1999  . Hemorrhoid surgery  1980's  . Anterior cervical decomp/discectomy fusion  11/1998    C5-C6  . Cardiovascular stress test  11/13/2007    EF 67%, NO ISCHEMIA   . Colonoscopy  2008    Dr. Sharlett Iles in [place]  . Cataract extraction w/ intraocular lens  implant, bilateral Bilateral 2010  . Inguinal hernia repair Right 2007,2009  . Cardiac catheterization  02/2006    Cath at [facility]; no significant CAD noted.   . Vein ligation and stripping Right 1992    Current Outpatient Rx  Name  Route  Sig  Dispense  Refill  . amLODipine (NORVASC) 5 MG tablet   Oral   Take 5 mg by mouth every morning.         Marland Kitchen aspirin 81 MG chewable tablet   Oral   Chew 81 mg by mouth every other day.         . clopidogrel (PLAVIX) 75 MG tablet   Oral   Take 75 mg by mouth daily.         Marland Kitchen  Coenzyme Q10 (COQ10 PO)   Oral   Take 1 capsule by mouth daily.          Marland Kitchen docusate sodium (COLACE) 100 MG capsule   Oral   Take 100 mg by mouth daily as needed for mild constipation.         . fludrocortisone (FLORINEF) 0.1 MG tablet   Oral   Take 0.1 mg by mouth daily.         . hydrochlorothiazide (HYDRODIURIL) 25 MG tablet   Oral   Take 25 mg by mouth daily.         . Multiple Vitamins-Minerals (CENTRUM PO)   Oral   Take 1 tablet by mouth daily.          Marland Kitchen OVER THE COUNTER MEDICATION   Topical   Apply 1 application topically at bedtime. [place] Foot Therapy Cream         . polyethylene glycol (MIRALAX / GLYCOLAX) packet   Oral   Take 17 g by mouth daily as needed.         . potassium chloride (K-DUR) 10 MEQ tablet   Oral   Take 1  tablet (10 mEq total) by mouth daily.   5 tablet   0     Allergies Avalide; Crestor; Darvocet; Lovastatin; and Zocor  Family History  Problem Relation Age of Onset  . Stroke Father   . Heart attack Father     Social History Social History  Substance Use Topics  . Smoking status: Never Smoker   . Smokeless tobacco: Never Used  . Alcohol Use: No    Review of Systems Constitutional: No fever/chills Eyes: No visual changes. ENT: No sore throat. Cardiovascular: Denies chest pain. Respiratory: Denies shortness of breath. Gastrointestinal: No abdominal pain.  No nausea, no vomiting.  No diarrhea.  No constipation. Genitourinary: Negative for dysuria. Musculoskeletal: Negative for back pain. Skin: Negative for rash. Neurological: Negative for headaches, focal weakness or numbness. States he passed out, but feels just fine now.  10-point ROS otherwise negative.  ____________________________________________   PHYSICAL EXAM:  VITAL SIGNS: ED Triage Vitals  Enc Vitals Group     BP 02/27/16 0952 133/67 mmHg     Pulse Rate 02/27/16 0952 59     Resp 02/27/16 0952 11     Temp 02/27/16 0952 97.4 F (36.3 C)     Temp Source 02/27/16 0952 Oral     SpO2 02/27/16 0952 100 %     Weight 02/27/16 0952 186 lb 15.2 oz (84.8 kg)     Height 02/27/16 0952 5\' 8"  (1.727 m)     Head Cir --      Peak Flow --      Pain Score --      Pain Loc --      Pain Edu? --      Excl. in [place]? --    Constitutional: Alert and oriented. Well appearing and in no acute distress.Patient wearing [employer], states that he is planning to watch the [facility] versus [employer] game this evening. Eyes: Conjunctivae are normal. PERRL. EOMI. Head: Atraumatic. Nose: No congestion/rhinnorhea. Mouth/Throat: Mucous membranes are moist.  Oropharynx non-erythematous. Neck: No stridor.  No cervical spine tenderness. Cardiovascular: Normal rate, regular rhythm. Grossly normal heart sounds.  Good peripheral  circulation. Respiratory: Normal respiratory effort.  No retractions. Lungs CTAB. Gastrointestinal: Soft and nontender. No distention.  Musculoskeletal: No lower extremity tenderness nor edema.   Neurologic:  Normal speech and language. No gross  focal neurologic deficits are appreciated. No pronator drift. No facial droop. Cranial nerve exam normal. Moves all extremities symmetrically with normal strength and sensation.  Skin:  Skin is warm, dry and intact. No rash noted. Psychiatric: Mood and affect are normal. Speech and behavior are normal.  ____________________________________________   LABS (all labs ordered are listed, but only abnormal results are displayed)  Labs Reviewed  BASIC METABOLIC PANEL - Abnormal; Notable for the following:    Sodium 129 (*)    Potassium 3.0 (*)    Chloride 94 (*)    Glucose, Bld 146 (*)    BUN 23 (*)    Calcium 8.5 (*)    All other components within normal limits  CBC - Abnormal; Notable for the following:    RBC 3.89 (*)    Hemoglobin 12.1 (*)    HCT 34.4 (*)    All other components within normal limits  URINALYSIS COMPLETEWITH MICROSCOPIC ([facility] ONLY) - Abnormal; Notable for the following:    Color, Urine YELLOW (*)    APPearance HAZY (*)    Squamous Epithelial / LPF 0-5 (*)    All other components within normal limits  TROPONIN I  GLUCOSE, CAPILLARY  CBG MONITORING, ED   ____________________________________________  EKG Reviewed and interpreted by me Significant artifact date to some slight tremor which the patient states he has Heart rate 60 QRS 120 QTc 460 Possible right bundle-branch block, nonspecific intraventricular conduction delay As compared with previous EKG from August of last year no significant changes found except for more artifact present No evidence of acute ischemic change noted. Disagree with computer read ____________________________________________  RADIOLOGY  No evidence of injury, no complaint of head or  neck pain. Find no indication for emergent imaging at this time. ____________________________________________   PROCEDURES  Procedure(s) performed: None  Critical Care performed: No  ____________________________________________   INITIAL IMPRESSION / ASSESSMENT AND PLAN / ED COURSE  Pertinent labs & imaging results that were available during my care of the patient were reviewed by me and considered in my medical decision making (see chart for details).  Patient presents after an episode of syncope. History of previous thought to be secondary to possible vasovagal type symptoms. Patient is awake alert oriented no distress on arrival to the ER. EKG reassuring, no cardiopulmonary symptoms.  Discussed with the patient's son Ronalee Belts, reports the patient has had similar episode almost a dozen times usually revolving around eating breakfast. He is agreeable with the plan to replete patient's potassium and given the number of times this is happened an extensive evaluation wishes for him to be discharged back to nursing home, which I think is totally appropriate as the patient is awake alert in no distress. We'll continue to monitor here, replete electrolytes. No evidence of acute cardiopulmonary event, malignant arrhythmia, or evidence of stroke by clinical examination or history. Suspect likely vasovagal.  ----------------------------------------- 12:44 PM on 02/27/2016 -----------------------------------------  Patient resting comfortably, no distress or concern. Patient's son present. Patient and son feel comfortable going back to care home, have provided a prescription for potassium, and he will follow-up with the staff at [facility] this week for recheck. Patient and son state that they have been following him for both low sodium and potassium recently.  Return precautions advised. ____________________________________________   FINAL CLINICAL IMPRESSION(S) / ED DIAGNOSES  Final  diagnoses:  Vasovagal syncope  Hypokalemia  Hyponatremia      Delman Kitten, MD 02/27/16 1245  Delman Kitten, MD 03/18/16 [id number]

## 2016-02-27 NOTE — ED Notes (Signed)
Pt verbalized understanding of discharge instructions. NAD at this time. 

## 2016-02-27 NOTE — Discharge Instructions (Signed)
You have been seen today in the Emergency Department (ED)  for syncope (passing out).  Your workup including labs and EKG show reassuring results although your sodium and potassium continued to be low.    Please call your regular doctor as soon as possible to schedule the next available clinic appointment to follow up with him/her regarding your visit to the ED and your symptoms.  Return to the Emergency Department (ED)  if you have any further syncopal episodes (pass out again) or develop ANY chest pain, pressure, tightness, trouble breathing, sudden sweating, or other symptoms that concern you.    Syncope, commonly known as fainting, is a temporary loss of consciousness. It occurs when the blood flow to the brain is reduced. Vasovagal syncope (also called neurocardiogenic syncope) is a fainting spell in which the blood flow to the brain is reduced because of a sudden drop in heart rate and blood pressure. Vasovagal syncope occurs when the brain and the cardiovascular system (blood vessels) do not adequately communicate and respond to each other. This is the most common cause of fainting. It often occurs in response to fear or some other type of emotional or physical stress. The body has a reaction in which the heart starts beating too slowly or the blood vessels expand, reducing blood pressure. This type of fainting spell is generally considered harmless. However, injuries can occur if a person takes a sudden fall during a fainting spell.  CAUSES  Vasovagal syncope occurs when a person's blood pressure and heart rate decrease suddenly, usually in response to a trigger. Many things and situations can trigger an episode. Some of these include:   Pain.   Fear.   The sight of blood or medical procedures, such as blood being drawn from a vein.   Common activities, such as coughing, swallowing, stretching, or going to the bathroom.   Emotional stress.   Prolonged standing, especially in a warm  environment.   Lack of sleep or rest.   Prolonged lack of food.   Prolonged lack of fluids.   Recent illness.  The use of certain drugs that affect blood pressure, such as cocaine, alcohol, marijuana, inhalants, and opiates.  SYMPTOMS  Before the fainting episode, you may:   Feel dizzy or light headed.   Become pale.  Sense that you are going to faint.   Feel like the room is spinning.   Have tunnel vision, only seeing directly in front of you.   Feel sick to your stomach (nauseous).   See spots or slowly lose vision.   Hear ringing in your ears.   Have a headache.   Feel warm and sweaty.   Feel a sensation of pins and needles. During the fainting spell, you will generally be unconscious for no longer than a couple minutes before waking up and returning to normal. If you get up too quickly before your body can recover, you may faint again. Some twitching or jerky movements may occur during the fainting spell.  DIAGNOSIS  Your health care provider will ask about your symptoms, take a medical history, and perform a physical exam. Various tests may be done to rule out other causes of fainting. These may include blood tests and tests to check the heart, such as electrocardiography, echocardiography, and possibly an electrophysiology study. When other causes have been ruled out, a test may be done to check the body's response to changes in position (tilt table test). TREATMENT  Most cases of vasovagal syncope do not  require treatment. Your health care provider may recommend ways to avoid fainting triggers and may provide home strategies for preventing fainting. If you must be exposed to a possible trigger, you can drink additional fluids to help reduce your chances of having an episode of vasovagal syncope. If you have warning signs of an oncoming episode, you can respond by positioning yourself favorably (lying down). If your fainting spells continue, you may be  given medicines to prevent fainting. Some medicines may help make you more resistant to repeated episodes of vasovagal syncope. Special exercises or compression stockings may be recommended. In rare cases, the surgical placement of a pacemaker is considered. HOME CARE INSTRUCTIONS   Learn to identify the warning signs of vasovagal syncope.   Sit or lie down at the first warning sign of a fainting spell. If sitting, put your head down between your legs. If you lie down, swing your legs up in the air to increase blood flow to the brain.   Avoid hot tubs and saunas.  Avoid prolonged standing.  Drink enough fluids to keep your urine clear or pale yellow. Avoid caffeine.  Increase salt in your diet as directed by your health care provider.   If you have to stand for a long time, perform movements such as:   Crossing your legs.   Flexing and stretching your leg muscles.   Squatting.   Moving your legs.   Bending over.   Only take over-the-counter or prescription medicines as directed by your health care provider. Do not suddenly stop any medicines without asking your health care provider first. [place] IF:   Your fainting spells continue or happen more frequently in spite of treatment.   You lose consciousness for more than a couple minutes.  You have fainting spells during or after exercising or after being startled.   You have new symptoms that occur with the fainting spells, such as:   Shortness of breath.  Chest pain.   Irregular heartbeat.   You have episodes of twitching or jerky movements that last longer than a few seconds.  You have episodes of twitching or jerky movements without obvious fainting. SEEK IMMEDIATE MEDICAL CARE IF:   You have injuries or bleeding after a fainting spell.   You have episodes of twitching or jerky movements that last longer than 5 minutes.   You have more than one spell of twitching or jerky movements  before returning to consciousness after fainting.   This information is not intended to replace advice given to you by your health care provider. Make sure you discuss any questions you have with your health care provider.   Document Released: 11/22/2012 Document Revised: 04/22/2015 Document Reviewed: 11/22/2012 Elsevier Interactive Patient Education [employer].

## 2016-02-27 NOTE — ED Notes (Signed)
Pt arrived by EMS from [facility] after a syncopal episode which staff states the Pt had a "few moments of LOC". Staff states BP was 55/35 upon arrival to ED pt's BP was 133/67.

## 2016-03-10 ENCOUNTER — Other Ambulatory Visit: Payer: Self-pay | Admitting: Orthopedic Surgery

## 2016-03-10 ENCOUNTER — Ambulatory Visit
Admission: RE | Admit: 2016-03-10 | Discharge: 2016-03-10 | Disposition: A | Discharge status: Home / Self Care | Payer: Medicare Other | Source: Ambulatory Visit | Attending: Orthopedic Surgery | Admitting: Orthopedic Surgery

## 2016-03-10 DIAGNOSIS — M8580 Other specified disorders of bone density and structure, unspecified site: Secondary | ICD-10-CM | POA: Insufficient documentation

## 2016-03-10 DIAGNOSIS — M6140 Other calcification of muscle, unspecified site: Secondary | ICD-10-CM | POA: Insufficient documentation

## 2016-03-10 DIAGNOSIS — I739 Peripheral vascular disease, unspecified: Secondary | ICD-10-CM | POA: Insufficient documentation

## 2016-03-10 DIAGNOSIS — Z96643 Presence of artificial hip joint, bilateral: Secondary | ICD-10-CM | POA: Diagnosis present

## 2016-03-10 DIAGNOSIS — M25551 Pain in right hip: Secondary | ICD-10-CM | POA: Diagnosis not present

## 2016-03-10 DIAGNOSIS — M25552 Pain in left hip: Secondary | ICD-10-CM | POA: Diagnosis present

## 2016-04-20 ENCOUNTER — Ambulatory Visit: Payer: Medicare Other | Admitting: Cardiology

## 2016-05-03 ENCOUNTER — Encounter: Payer: Self-pay | Admitting: *Deleted

## 2016-05-03 DIAGNOSIS — R55 Syncope and collapse: Secondary | ICD-10-CM | POA: Insufficient documentation

## 2016-05-03 DIAGNOSIS — I2510 Atherosclerotic heart disease of native coronary artery without angina pectoris: Secondary | ICD-10-CM | POA: Insufficient documentation

## 2016-05-03 DIAGNOSIS — M8580 Other specified disorders of bone density and structure, unspecified site: Secondary | ICD-10-CM | POA: Insufficient documentation

## 2016-05-03 DIAGNOSIS — M48061 Spinal stenosis, lumbar region without neurogenic claudication: Secondary | ICD-10-CM | POA: Insufficient documentation

## 2016-05-03 DIAGNOSIS — E785 Hyperlipidemia, unspecified: Secondary | ICD-10-CM | POA: Insufficient documentation

## 2016-05-03 DIAGNOSIS — R749 Abnormal serum enzyme level, unspecified: Secondary | ICD-10-CM | POA: Insufficient documentation

## 2016-05-03 DIAGNOSIS — M40209 Unspecified kyphosis, site unspecified: Secondary | ICD-10-CM | POA: Insufficient documentation

## 2016-05-03 DIAGNOSIS — E2740 Unspecified adrenocortical insufficiency: Secondary | ICD-10-CM | POA: Insufficient documentation

## 2016-05-03 DIAGNOSIS — Z125 Encounter for screening for malignant neoplasm of prostate: Secondary | ICD-10-CM | POA: Insufficient documentation

## 2016-05-03 DIAGNOSIS — N400 Enlarged prostate without lower urinary tract symptoms: Secondary | ICD-10-CM | POA: Insufficient documentation

## 2016-05-03 DIAGNOSIS — I6509 Occlusion and stenosis of unspecified vertebral artery: Secondary | ICD-10-CM | POA: Insufficient documentation

## 2016-05-03 DIAGNOSIS — I639 Cerebral infarction, unspecified: Secondary | ICD-10-CM | POA: Insufficient documentation

## 2016-05-03 DIAGNOSIS — Z860101 Personal history of adenomatous and serrated colon polyps: Secondary | ICD-10-CM | POA: Insufficient documentation

## 2016-05-03 DIAGNOSIS — Z0000 Encounter for general adult medical examination without abnormal findings: Secondary | ICD-10-CM | POA: Insufficient documentation

## 2016-05-03 DIAGNOSIS — Q663 Other congenital varus deformities of feet, unspecified foot: Secondary | ICD-10-CM | POA: Insufficient documentation

## 2016-05-03 DIAGNOSIS — I872 Venous insufficiency (chronic) (peripheral): Secondary | ICD-10-CM | POA: Insufficient documentation

## 2016-05-03 DIAGNOSIS — G9332 Myalgic encephalomyelitis/chronic fatigue syndrome: Secondary | ICD-10-CM | POA: Insufficient documentation

## 2016-05-03 DIAGNOSIS — H6120 Impacted cerumen, unspecified ear: Secondary | ICD-10-CM | POA: Insufficient documentation

## 2016-05-03 DIAGNOSIS — M438X9 Other specified deforming dorsopathies, site unspecified: Secondary | ICD-10-CM | POA: Insufficient documentation

## 2016-05-03 DIAGNOSIS — Z23 Encounter for immunization: Secondary | ICD-10-CM | POA: Insufficient documentation

## 2016-05-03 DIAGNOSIS — Z7409 Other reduced mobility: Secondary | ICD-10-CM | POA: Insufficient documentation

## 2016-05-03 DIAGNOSIS — I10 Essential (primary) hypertension: Secondary | ICD-10-CM | POA: Insufficient documentation

## 2016-05-03 DIAGNOSIS — E559 Vitamin D deficiency, unspecified: Secondary | ICD-10-CM | POA: Insufficient documentation

## 2016-05-03 DIAGNOSIS — W19XXXA Unspecified fall, initial encounter: Secondary | ICD-10-CM | POA: Insufficient documentation

## 2016-05-03 DIAGNOSIS — R5382 Chronic fatigue, unspecified: Secondary | ICD-10-CM

## 2016-05-03 DIAGNOSIS — R42 Dizziness and giddiness: Secondary | ICD-10-CM | POA: Insufficient documentation

## 2016-05-03 DIAGNOSIS — M25559 Pain in unspecified hip: Secondary | ICD-10-CM | POA: Insufficient documentation

## 2016-05-03 DIAGNOSIS — D8989 Other specified disorders involving the immune mechanism, not elsewhere classified: Secondary | ICD-10-CM | POA: Insufficient documentation

## 2016-05-03 DIAGNOSIS — M8008XD Age-related osteoporosis with current pathological fracture, vertebra(e), subsequent encounter for fracture with routine healing: Secondary | ICD-10-CM | POA: Insufficient documentation

## 2016-05-03 DIAGNOSIS — R29898 Other symptoms and signs involving the musculoskeletal system: Secondary | ICD-10-CM | POA: Insufficient documentation

## 2016-05-03 DIAGNOSIS — R031 Nonspecific low blood-pressure reading: Secondary | ICD-10-CM | POA: Insufficient documentation

## 2016-05-03 DIAGNOSIS — M1990 Unspecified osteoarthritis, unspecified site: Secondary | ICD-10-CM | POA: Insufficient documentation

## 2016-05-03 DIAGNOSIS — M791 Myalgia, unspecified site: Secondary | ICD-10-CM | POA: Insufficient documentation

## 2016-05-03 DIAGNOSIS — Z86010 Personal history of colonic polyps: Secondary | ICD-10-CM | POA: Insufficient documentation

## 2016-05-06 ENCOUNTER — Ambulatory Visit (INDEPENDENT_AMBULATORY_CARE_PROVIDER_SITE_OTHER): Payer: Medicare Other | Admitting: Internal Medicine

## 2016-05-06 ENCOUNTER — Encounter: Payer: Self-pay | Admitting: Internal Medicine

## 2016-05-06 VITALS — BP 122/58 | HR 67 | Ht 68.0 in | Wt 171.1 lb

## 2016-05-06 DIAGNOSIS — I10 Essential (primary) hypertension: Secondary | ICD-10-CM

## 2016-05-06 DIAGNOSIS — I340 Nonrheumatic mitral (valve) insufficiency: Secondary | ICD-10-CM

## 2016-05-06 LAB — BASIC METABOLIC PANEL
BUN: 26 mg/dL — ABNORMAL HIGH (ref 7–25)
CALCIUM: 8.8 mg/dL (ref 8.6–10.3)
CO2: 27 mmol/L (ref 20–31)
CREATININE: 0.87 mg/dL (ref 0.70–1.11)
Chloride: 92 mmol/L — ABNORMAL LOW (ref 98–110)
Glucose, Bld: 112 mg/dL — ABNORMAL HIGH (ref 65–99)
Potassium: 3.8 mmol/L (ref 3.5–5.3)
SODIUM: 128 mmol/L — AB (ref 135–146)

## 2016-05-06 LAB — LIPID PANEL
CHOL/HDL RATIO: 3.5 ratio (ref <=5.0)
CHOLESTEROL: 194 mg/dL (ref 125–200)
HDL: 56 mg/dL (ref >=40)
LDL Cholesterol: 100 mg/dL (ref <130)
TRIGLYCERIDES: 189 mg/dL — AB (ref <150)
VLDL: 38 mg/dL — AB (ref <30)

## 2016-05-06 LAB — CBC
HCT: 36.6 % — ABNORMAL LOW (ref 38.5–50.0)
HEMOGLOBIN: 12.7 g/dL — AB (ref 13.2–17.1)
MCH: 31.3 pg (ref 27.0–33.0)
MCHC: 34.7 g/dL (ref 32.0–36.0)
MCV: 90.1 fL (ref 80.0–100.0)
MPV: 8.5 fL (ref 7.5–12.5)
PLATELETS: 229 10*3/uL (ref 140–400)
RBC: 4.06 MIL/uL — AB (ref 4.20–5.80)
RDW: 13.9 % (ref 11.0–15.0)
WBC: 6.9 10*3/uL (ref 3.8–10.8)

## 2016-05-06 LAB — MAGNESIUM: Magnesium: 1.9 mg/dL (ref 1.5–2.5)

## 2016-05-06 NOTE — Patient Instructions (Signed)
Your physician recommends that you continue on your current medications as directed. Please refer to the Current Medication list given to you today. Your physician recommends that you return for lab work in: today (bmet, cbc, lipids, magnesium)  Your physician wants you to follow-up in: January, 2018 with Dr. Harrington Challenger.  You will receive a reminder letter in the mail two months in advance. If you don't receive a letter, please call our office to schedule the follow-up appointment.

## 2016-05-06 NOTE — Progress Notes (Signed)
Cardiology Office Note   Date:  05/06/2016   ID:  ISSIAC SEACHRIST, DOB [birth year], MRN [id number]  PCP:  PROVIDER NOT IN SYSTEM  Cardiologist:   Dorris Carnes, MD   F/U of HTN      History of Present Illness: Matthew Bright is a 82 y.o. male who was previously followed by Matthew Bright He has a history of HTN, syncope (Possibly due to overmedication/dehydration), HL (intolerant to high dose statins), oaritc insuff, mitral regureg. , midl CV dz.  He was last in clinic in April 2016  He was seen in the ER in March after passing out. Pt was having breakfast.  Felt BP drop.  WOke up    Denies palptations  Did have very mild dizziness prior.     Since then he has had occasional lightheadedness  Does check his BP at home     Outpatient Prescriptions Prior to Visit  Medication Sig Dispense Refill  . amLODipine (NORVASC) 5 MG tablet Take 5 mg by mouth every morning.    Marland Kitchen aspirin 81 MG chewable tablet Chew 81 mg by mouth every other day.    . clopidogrel (PLAVIX) 75 MG tablet Take 75 mg by mouth daily.    . Coenzyme Q10 (COQ10 PO) Take 1 capsule by mouth daily.     Marland Kitchen docusate sodium (COLACE) 100 MG capsule Take 100 mg by mouth daily as needed for mild constipation.    . fludrocortisone (FLORINEF) 0.1 MG tablet Take 0.1 mg by mouth daily.    . hydrochlorothiazide (HYDRODIURIL) 25 MG tablet Take 25 mg by mouth daily.    . Multiple Vitamins-Minerals (CENTRUM PO) Take 1 tablet by mouth daily.     Marland Kitchen OVER THE COUNTER MEDICATION Apply 1 application topically at bedtime. Curel Foot Therapy Cream    . polyethylene glycol (MIRALAX / GLYCOLAX) packet Take 17 g by mouth daily as needed.    . potassium chloride (K-DUR) 10 MEQ tablet Take 1 tablet (10 mEq total) by mouth daily. 5 tablet 0   No facility-administered medications prior to visit.     Allergies:   Avalide; Crestor; Darvocet; Lovastatin; Propoxyphene; and Zocor   Past Medical History  Diagnosis Date  . Hyperlipidemia   . Enlarged prostate    . Exertional dyspnea   . Syncope and collapse  2008; October 2012  . Hypertension   . Varicose veins   . Carotid artery disease ([place])     per CTA but no significant stenosis  . Allergy   . Hernia 2006  . Special screening for malignant neoplasms, colon   . Vertigo 2008  . Cerebellar stroke, acute ([place]) 2008    denies residual on 07/08/2014  . OA (osteoarthritis)     "~ qwhere you can have it"  . Prostate cancer [facility])     pt denies this hx on 07/08/2014    Past Surgical History  Procedure Laterality Date  . Prostate biopsy  ~ 1990; ~ 2009  . Vein ligation and stripping Left 1953, 1968, 1980's; 1992  . Total hip arthroplasty Bilateral 2000-2001    Dr. Percell Miller  . Lumbar microdiscectomy  1999  . Hemorrhoid surgery  1980's  . Anterior cervical decomp/discectomy fusion  11/1998    C5-C6  . Cardiovascular stress test  11/13/2007    EF 67%, NO ISCHEMIA   . Colonoscopy  2008    Dr. Sharlett Iles in [place]  . Cataract extraction w/ intraocular lens  implant, bilateral Bilateral 2010  . Inguinal  hernia repair Right 2007,2009  . Cardiac catheterization  02/2006    Cath at [facility]; no significant CAD noted.   . Vein ligation and stripping Right 1992     Social History:  The patient  reports that he has never smoked. He has never used smokeless tobacco. He reports that he does not drink alcohol or use illicit drugs.   Family History:  The patient's family history includes Heart attack in his father; Stroke in his father.    ROS:  Please see the history of present illness. All other systems are reviewed and  Negative to the above problem except as noted.    PHYSICAL EXAM: VS:  BP 122/58 mmHg  Pulse 67  Ht 5\' 8"  (1.727 m)  Wt 171 lb 1.9 oz (77.62 kg)  BMI 26.02 kg/m2  SpO2 98%  GEN: Well nourished, well developed, in no acute distress HEENT: normal Neck: no JVD, carotid bruits, or masses Cardiac: RRR; no murmurs, rubs, or gallops, Tr to 1+ edema  Respiratory:  clear to auscultation  bilaterally, normal work of breathing GI: soft, nontender, nondistended, + BS  No hepatomegaly  MS: no deformity Moving all extremities   Skin: warm and dry, no rash Neuro:  Strength and sensation are intact Psych: euthymic mood, full affect   EKG:  EKG is not ordered today.   Lipid Panel    Component Value Date/Time   CHOL 156 07/09/2014 0005   TRIG 60 07/09/2014 0005   HDL 56 07/09/2014 0005   CHOLHDL 2.8 07/09/2014 0005   VLDL 12 07/09/2014 0005   LDLCALC 88 07/09/2014 0005      Wt Readings from Last 3 Encounters:  05/06/16 171 lb 1.9 oz (77.62 kg)  02/27/16 186 lb 15.2 oz (84.8 kg)  08/18/15 173 lb (78.472 kg)      ASSESSMENT AND PLAN:  1  Syncope  Not clear what etiol is  Will set up for echo Pt is  On low dose florinef and HCTZ which is unclear   Will check BMET  2 Hypertension  BP is OK  I would not push meds further  Keep the same  3  LIpids  Check today  4  Valvular heart dz  Follow    F.U set for Jan 2018     F/U in Dec / Jan     Signed, Dorris Carnes, MD  05/06/2016 11:21 AM    [place] [place], [place],   [address] Phone: [phone]; Fax: [phone]

## 2016-05-07 ENCOUNTER — Encounter: Payer: Medicare Other | Admitting: Cardiology

## 2016-05-07 ENCOUNTER — Ambulatory Visit: Payer: Medicare Other | Admitting: Cardiology

## 2016-05-10 ENCOUNTER — Other Ambulatory Visit: Payer: Self-pay | Admitting: *Deleted

## 2016-10-20 ENCOUNTER — Other Ambulatory Visit: Payer: Self-pay | Admitting: Internal Medicine

## 2016-10-20 DIAGNOSIS — M545 Low back pain, unspecified: Secondary | ICD-10-CM

## 2016-11-03 ENCOUNTER — Ambulatory Visit: Payer: Medicare Other

## 2016-11-03 ENCOUNTER — Other Ambulatory Visit: Payer: Self-pay | Admitting: Internal Medicine

## 2016-11-03 DIAGNOSIS — M545 Low back pain, unspecified: Secondary | ICD-10-CM

## 2016-11-03 DIAGNOSIS — R200 Anesthesia of skin: Secondary | ICD-10-CM

## 2016-11-04 ENCOUNTER — Ambulatory Visit
Admission: RE | Admit: 2016-11-04 | Discharge: 2016-11-04 | Disposition: A | Discharge status: Home / Self Care | Payer: Medicare Other | Source: Ambulatory Visit | Attending: Internal Medicine | Admitting: Internal Medicine

## 2016-11-04 DIAGNOSIS — M479 Spondylosis, unspecified: Secondary | ICD-10-CM | POA: Insufficient documentation

## 2016-11-04 DIAGNOSIS — M48061 Spinal stenosis, lumbar region without neurogenic claudication: Secondary | ICD-10-CM | POA: Diagnosis not present

## 2016-11-04 DIAGNOSIS — M545 Low back pain, unspecified: Secondary | ICD-10-CM

## 2016-11-12 ENCOUNTER — Other Ambulatory Visit: Payer: Self-pay | Admitting: Internal Medicine

## 2016-11-12 ENCOUNTER — Ambulatory Visit
Admission: RE | Admit: 2016-11-12 | Discharge: 2016-11-12 | Disposition: A | Discharge status: Home / Self Care | Payer: Medicare Other | Source: Ambulatory Visit | Attending: Internal Medicine | Admitting: Internal Medicine

## 2016-11-12 DIAGNOSIS — M47812 Spondylosis without myelopathy or radiculopathy, cervical region: Secondary | ICD-10-CM | POA: Insufficient documentation

## 2016-11-12 DIAGNOSIS — R200 Anesthesia of skin: Secondary | ICD-10-CM

## 2016-11-12 DIAGNOSIS — G9589 Other specified diseases of spinal cord: Secondary | ICD-10-CM | POA: Diagnosis not present

## 2016-11-12 DIAGNOSIS — M545 Low back pain, unspecified: Secondary | ICD-10-CM

## 2016-11-12 LAB — POCT I-STAT CREATININE: Creatinine, Ser: 0.9 mg/dL (ref 0.61–1.24)

## 2016-11-12 MED ORDER — GADOBENATE DIMEGLUMINE 529 MG/ML IV SOLN
20.0000 mL | Freq: Once | INTRAVENOUS | Status: AC | PRN
  Administered 2016-11-12: 15 mL via INTRAVENOUS

## 2016-11-27 ENCOUNTER — Emergency Department: Payer: Medicare Other

## 2016-11-27 ENCOUNTER — Encounter: Payer: Self-pay | Admitting: Emergency Medicine

## 2016-11-27 ENCOUNTER — Encounter
Admission: RE | Admit: 2016-11-27 | Discharge: 2016-11-27 | Disposition: A | Discharge status: Home / Self Care | Payer: Medicare Other | Source: Ambulatory Visit | Attending: Internal Medicine | Admitting: Internal Medicine

## 2016-11-27 ENCOUNTER — Emergency Department
Admission: EM | Admit: 2016-11-27 | Discharge: 2016-11-27 | Disposition: A | Discharge status: Home / Self Care | Payer: Medicare Other | Attending: Emergency Medicine | Admitting: Emergency Medicine

## 2016-11-27 DIAGNOSIS — R296 Repeated falls: Secondary | ICD-10-CM | POA: Insufficient documentation

## 2016-11-27 DIAGNOSIS — M6281 Muscle weakness (generalized): Secondary | ICD-10-CM

## 2016-11-27 DIAGNOSIS — I2510 Atherosclerotic heart disease of native coronary artery without angina pectoris: Secondary | ICD-10-CM | POA: Insufficient documentation

## 2016-11-27 DIAGNOSIS — S32021A Stable burst fracture of second lumbar vertebra, initial encounter for closed fracture: Secondary | ICD-10-CM | POA: Insufficient documentation

## 2016-11-27 DIAGNOSIS — S32000A Wedge compression fracture of unspecified lumbar vertebra, initial encounter for closed fracture: Secondary | ICD-10-CM

## 2016-11-27 DIAGNOSIS — I10 Essential (primary) hypertension: Secondary | ICD-10-CM | POA: Insufficient documentation

## 2016-11-27 DIAGNOSIS — S329XXA Fracture of unspecified parts of lumbosacral spine and pelvis, initial encounter for closed fracture: Secondary | ICD-10-CM

## 2016-11-27 DIAGNOSIS — Z79899 Other long term (current) drug therapy: Secondary | ICD-10-CM | POA: Diagnosis not present

## 2016-11-27 DIAGNOSIS — S72001A Fracture of unspecified part of neck of right femur, initial encounter for closed fracture: Secondary | ICD-10-CM

## 2016-11-27 DIAGNOSIS — S0990XA Unspecified injury of head, initial encounter: Secondary | ICD-10-CM

## 2016-11-27 DIAGNOSIS — S0001XA Abrasion of scalp, initial encounter: Secondary | ICD-10-CM | POA: Diagnosis not present

## 2016-11-27 DIAGNOSIS — Y9301 Activity, walking, marching and hiking: Secondary | ICD-10-CM | POA: Insufficient documentation

## 2016-11-27 DIAGNOSIS — Z7982 Long term (current) use of aspirin: Secondary | ICD-10-CM | POA: Diagnosis not present

## 2016-11-27 DIAGNOSIS — S72101A Unspecified trochanteric fracture of right femur, initial encounter for closed fracture: Secondary | ICD-10-CM | POA: Diagnosis not present

## 2016-11-27 DIAGNOSIS — W010XXA Fall on same level from slipping, tripping and stumbling without subsequent striking against object, initial encounter: Secondary | ICD-10-CM | POA: Diagnosis not present

## 2016-11-27 DIAGNOSIS — W19XXXA Unspecified fall, initial encounter: Secondary | ICD-10-CM

## 2016-11-27 DIAGNOSIS — Y929 Unspecified place or not applicable: Secondary | ICD-10-CM | POA: Insufficient documentation

## 2016-11-27 DIAGNOSIS — S7001XA Contusion of right hip, initial encounter: Secondary | ICD-10-CM

## 2016-11-27 DIAGNOSIS — Y999 Unspecified external cause status: Secondary | ICD-10-CM | POA: Diagnosis not present

## 2016-11-27 DIAGNOSIS — T148XXA Other injury of unspecified body region, initial encounter: Secondary | ICD-10-CM

## 2016-11-27 MED ORDER — HYDROCODONE-ACETAMINOPHEN 5-325 MG PO TABS
ORAL_TABLET | ORAL | Status: AC
  Administered 2016-11-27: 1 via ORAL
  Filled 2016-11-27: qty 1

## 2016-11-27 MED ORDER — HYDROCODONE-ACETAMINOPHEN 5-325 MG PO TABS: 1.0000 | ORAL_TABLET | Freq: Four times a day (QID) | ORAL | 0 refills | Status: DC | PRN

## 2016-11-27 MED ORDER — HYDROCODONE-ACETAMINOPHEN 5-325 MG PO TABS
1.0000 | ORAL_TABLET | Freq: Once | ORAL | Status: AC
  Administered 2016-11-27: 1 via ORAL

## 2016-11-27 NOTE — ED Triage Notes (Signed)
Pt presents to ED via [facility] from [facility] c/o unwitnessed fall with head involvement. Pt states he was wearing TED hose that are slippery and lost his footing. Small abrasion noted to back of head. On plavix. Pt denies any acute pain. Denies LOC. Reports chronic loss of sensation to R hand.

## 2016-11-27 NOTE — Clinical Social Work Note (Signed)
Clinical Social Work Assessment  Patient Details  Name: Matthew Bright MRN: [id number] Date of Birth: 08-13-1926  Date of referral:  11/27/16               Reason for consult:  Facility Placement                Permission sought to share information with:  Family Supports, Customer service manager Permission granted to share information::  Yes, Verbal Permission Granted  Name::     Davd Glazebrook ( daughter) [phone] Zandre Caranci ( son) [phone] [facility]  Agency::  All facilities  Relationship::     Contact Information:  Jamai Jespersen ( daughter) [phone] Shaylon Mangal ( son) [phone] HCPOA  Housing/Transportation Living arrangements for the past 2 months:  [place] [facility]) Source of Information:  Patient, Adult Children Patient Interpreter Needed:  None Criminal Activity/Legal Involvement Pertinent to Current Situation/Hospitalization:  No - Comment as needed Significant Relationships:  Adult Children, Spouse Lives with:  Self, Spouse Do you feel safe going back to the place where you live?  Yes Need for family participation in patient care:  Yes (Comment)  Care giving concerns: Family would like to see him having PT- to assist his strength   Social Worker assessment / plan: LCSW introduced myself and purpose for consult today. Verbal consent was given by patient to speak to his daughter/son and facilities. This 82 year old man is oriented x4 . He has exceptional hearing and speech and fair to good eye sight. Patient has come to hospital as he had a recent fall and has a hip fracture to right hip and pelvis. EDP recommends a PT consult and for him to be placed in a SNF for short term rehab. Patient and daughter is agreeable to this plan. Patient is married and his spouse and himself resides at  [place] ( they actually have separate rooms across from each other but are together in dining everyday) Patient and his spouse have their  [place] daughter comes in daily to assist with dressing patient and wife and helps with their ADL's. Patient is continent during the day and wears attends at bedtime. He had a recent fall and unable to walk. He uses a walker and due to arthritis hunches over when using walker, he has right hand numbness and was planning to see Neurosurgeon Dr Ronnald Ramp at [facility]. ( prior back and  bothhip replacement surguries 18 years ago) Patient is insured with [employer]. There is a DNR and the son and daughter are both HCPOA.  Awaiting PT consult.  Employment status:  Retired Forensic scientist:  Chief Operating Officer) PT Recommendations:  [place] / Referral to community resources:  [place]  Patient/Family's Response to care: They are pleased he will have rehab to increase his strength  Patient/Family's Understanding of and Emotional Response to Diagnosis, Current Treatment, and Prognosis:  Daughter and son understand PT will be helpful with their father.  Emotional Assessment Appearance:  Appears stated age Attitude/Demeanor/Rapport:   (Calm, receptive) Affect (typically observed):  Accepting, Adaptable, Calm Orientation:  Oriented to Self, Oriented to Place, Oriented to  Time, Oriented to Situation Alcohol / Substance use:  Not Applicable Psych involvement (Current and /or in the community):  No (Comment)  Discharge Needs  Concerns to be addressed:  No discharge needs identified Readmission within the last 30 days:  No Current discharge risk:  Physical Impairment (Hip Fracture) Barriers to  Discharge:  No Barriers Identified   [name], LCSW 11/27/2016, 11:09 AM

## 2016-11-27 NOTE — NC FL2 (Deleted)
[place] LEVEL OF CARE SCREENING TOOL     IDENTIFICATION  Patient Name: Matthew Bright Birthdate: [birth year] Sex: male Admission Date (Current Location): 11/27/2016  [place] and [place] Number:  Engineering geologist and Address:  [facility], [address], [place], [place] [address]      Provider Number: [id number]  Attending Physician Name and Address:  Lisa Roca, MD  Relative Name and Phone Number:       Current Level of Care: Hospital Recommended Level of Care: [place] Prior Approval Number:    Date Approved/Denied:   PASRR Number:    Discharge Plan: SNF    Current Diagnoses: Patient Active Problem List   Diagnosis Date Noted  . Chronic adrenal insufficiency ([place]) 05/03/2016  . Age related osteoporosis 05/03/2016  . Benign enlargement of prostate 05/03/2016  . Arteriosclerosis of coronary artery 05/03/2016  . Cerumen impaction 05/03/2016  . CFIDS (chronic fatigue and immune dysfunction syndrome) ([place]) 05/03/2016  . Cerebrovascular accident (CVA) ([place]) 05/03/2016  . Dizziness 05/03/2016  . Abnormal serum enzyme level 05/03/2016  . Fall 05/03/2016  . H/O adenomatous polyp of colon 05/03/2016  . BP (high blood pressure) 05/03/2016  . Arterial blood pressure decreased 05/03/2016  . HLD (hyperlipidemia) 05/03/2016  . Angulation of spine 05/03/2016  . Arthralgia of hip 05/03/2016  . Leg weakness 05/03/2016  . Spinal stenosis of lumbar region without neurogenic claudication 05/03/2016  . Impaired mobility 05/03/2016  . Muscle ache 05/03/2016  . Need for vaccination 05/03/2016  . Arthritis, degenerative 05/03/2016  . Osteopenia 05/03/2016  . Venous (peripheral) insufficiency 05/03/2016  . Encounter for screening for malignant neoplasm of prostate 05/03/2016  . Occlusion and stenosis of vertebral artery 05/03/2016  . Encounter for general adult medical examination without abnormal findings 05/03/2016  .  Episode of syncope 05/03/2016  . Pes varus 05/03/2016  . Head revolving around 05/03/2016  . Avitaminosis D 05/03/2016  . Mitral regurgitation 04/17/2015  . TIA (transient ischemic attack) 07/08/2014  . Aortic valve disease 04/05/2014  . Asymptomatic varicose veins 08/16/2013  . Hyposmolality and/or hyponatremia 04/02/2013  . Syncope, vasovagal 12/08/2011  . Hypercholesterolemia 11/30/2011  . HTN (hypertension) 10/22/2011  . Abnormal EKG 10/22/2011    Orientation RESPIRATION BLADDER Height & Weight     Self, Time, Situation, Place  Normal Continent (wears attends in evening) Weight: 171 lb (77.6 kg) Height:  5\' 7"  (170.2 cm)  BEHAVIORAL SYMPTOMS/MOOD NEUROLOGICAL BOWEL NUTRITION STATUS      Continent Diet (normal)  AMBULATORY STATUS COMMUNICATION OF NEEDS Skin   Supervision   Normal                       Personal Care Assistance Level of Assistance  Bathing, Feeding, Dressing, Total care Bathing Assistance: Limited assistance Feeding assistance: Independent Dressing Assistance: Independent Total Care Assistance: Limited assistance   Functional Limitations Info  Sight, Hearing, Speech Sight Info: Adequate Hearing Info: Adequate Speech Info: Adequate    SPECIAL CARE FACTORS FREQUENCY                       Contractures      Additional Factors Info  Code Status, Allergies Code Status Info: DNR Allergies Info: Avalide,Crestor,Darvocet,Lovastaton,propoxyohene, zocor           Current Medications (11/27/2016):  This is the current hospital active medication list No current facility-administered medications for this encounter.    Current Outpatient Prescriptions  Medication Sig  Dispense Refill  . amLODipine (NORVASC) 5 MG tablet Take 5 mg by mouth every morning.    Marland Kitchen aspirin 81 MG chewable tablet Chew 81 mg by mouth every other day.    . clopidogrel (PLAVIX) 75 MG tablet Take 75 mg by mouth daily.    . Coenzyme Q10 (COQ10 PO) Take 1 capsule by mouth  daily.     Marland Kitchen docusate sodium (COLACE) 100 MG capsule Take 100 mg by mouth daily as needed for mild constipation.    . fludrocortisone (FLORINEF) 0.1 MG tablet Take 0.1 mg by mouth daily.    . hydrochlorothiazide (HYDRODIURIL) 25 MG tablet Take 12.5 mg by mouth daily.    . Multiple Vitamins-Minerals (CENTRUM PO) Take 1 tablet by mouth daily.     Marland Kitchen OVER THE COUNTER MEDICATION Apply 1 application topically at bedtime. Curel Foot Therapy Cream    . polyethylene glycol (MIRALAX / GLYCOLAX) packet Take 17 g by mouth daily as needed.    . potassium chloride (K-DUR) 10 MEQ tablet Take 1 tablet (10 mEq total) by mouth daily. 5 tablet 0     Discharge Medications: Please see discharge summary for a list of discharge medications.  Relevant Imaging Results:  Relevant Lab Results:   Additional Information SSN  [id number]  [name], [place]

## 2016-11-27 NOTE — NC FL2 (Signed)
[place] LEVEL OF CARE SCREENING TOOL     IDENTIFICATION  Patient Name: Matthew Bright Birthdate: Nov 15, 1926 Sex: male Admission Date (Current Location): 11/27/2016  [place] and [place] Number:  Engineering geologist and Address:  [facility], [address], [place], [place] [address]      Provider Number: [id number]  Attending Physician Name and Address:  Lisa Roca, MD  Relative Name and Phone Number:       Current Level of Care: Hospital Recommended Level of Care: [place] Prior Approval Number:    Date Approved/Denied:   PASRR Number:   [id number]   Discharge Plan: SNF    Current Diagnoses: Patient Active Problem List   Diagnosis Date Noted  . Chronic adrenal insufficiency ([place]) 05/03/2016  . Age related osteoporosis 05/03/2016  . Benign enlargement of prostate 05/03/2016  . Arteriosclerosis of coronary artery 05/03/2016  . Cerumen impaction 05/03/2016  . CFIDS (chronic fatigue and immune dysfunction syndrome) ([place]) 05/03/2016  . Cerebrovascular accident (CVA) ([place]) 05/03/2016  . Dizziness 05/03/2016  . Abnormal serum enzyme level 05/03/2016  . Fall 05/03/2016  . H/O adenomatous polyp of colon 05/03/2016  . BP (high blood pressure) 05/03/2016  . Arterial blood pressure decreased 05/03/2016  . HLD (hyperlipidemia) 05/03/2016  . Angulation of spine 05/03/2016  . Arthralgia of hip 05/03/2016  . Leg weakness 05/03/2016  . Spinal stenosis of lumbar region without neurogenic claudication 05/03/2016  . Impaired mobility 05/03/2016  . Muscle ache 05/03/2016  . Need for vaccination 05/03/2016  . Arthritis, degenerative 05/03/2016  . Osteopenia 05/03/2016  . Venous (peripheral) insufficiency 05/03/2016  . Encounter for screening for malignant neoplasm of prostate 05/03/2016  . Occlusion and stenosis of vertebral artery 05/03/2016  . Encounter for general adult medical examination without abnormal findings  05/03/2016  . Episode of syncope 05/03/2016  . Pes varus 05/03/2016  . Head revolving around 05/03/2016  . Avitaminosis D 05/03/2016  . Mitral regurgitation 04/17/2015  . TIA (transient ischemic attack) 07/08/2014  . Aortic valve disease 04/05/2014  . Asymptomatic varicose veins 08/16/2013  . Hyposmolality and/or hyponatremia 04/02/2013  . Syncope, vasovagal 12/08/2011  . Hypercholesterolemia 11/30/2011  . HTN (hypertension) 10/22/2011  . Abnormal EKG 10/22/2011    Orientation RESPIRATION BLADDER Height & Weight     Self, Time, Situation, Place  Normal Continent (wears attends in evening) Weight: 171 lb (77.6 kg) Height:  5\' 7"  (170.2 cm)  BEHAVIORAL SYMPTOMS/MOOD NEUROLOGICAL BOWEL NUTRITION STATUS      Continent Diet (normal)  AMBULATORY STATUS COMMUNICATION OF NEEDS Skin   Supervision   Normal                       Personal Care Assistance Level of Assistance  Bathing, Feeding, Dressing, Total care Bathing Assistance: Limited assistance Feeding assistance: Independent Dressing Assistance: Independent Total Care Assistance: Limited assistance   Functional Limitations Info  Sight, Hearing, Speech Sight Info: Adequate Hearing Info: Adequate Speech Info: Adequate    SPECIAL CARE FACTORS FREQUENCY                       Contractures      Additional Factors Info  Code Status, Allergies Code Status Info: DNR Allergies Info: Avalide,Crestor,Darvocet,Lovastaton,propoxyohene, zocor           Current Medications (11/27/2016):  This is the current hospital active medication list No current facility-administered medications for this encounter.    Current Outpatient Prescriptions  Medication Sig Dispense Refill  . amLODipine (NORVASC) 5 MG tablet Take 5 mg by mouth every morning.    Marland Kitchen aspirin 81 MG chewable tablet Chew 81 mg by mouth every other day.    . clopidogrel (PLAVIX) 75 MG tablet Take 75 mg by mouth daily.    . Coenzyme Q10 (COQ10 PO) Take 1  capsule by mouth daily.     Marland Kitchen docusate sodium (COLACE) 100 MG capsule Take 100 mg by mouth daily as needed for mild constipation.    . fludrocortisone (FLORINEF) 0.1 MG tablet Take 0.1 mg by mouth daily.    . hydrochlorothiazide (HYDRODIURIL) 25 MG tablet Take 12.5 mg by mouth daily.    . Multiple Vitamins-Minerals (CENTRUM PO) Take 1 tablet by mouth daily.     Marland Kitchen OVER THE COUNTER MEDICATION Apply 1 application topically at bedtime. [place]urel Foot Therapy Cream    . polyethylene glycol (MIRALAX / GLYCOLAX) packet Take 17 g by mouth daily as needed.    . potassium chloride (K-DUR) 10 MEQ tablet Take 1 tablet (10 mEq total) by mouth daily. 5 tablet 0     Discharge Medications: Please see discharge summary for a list of discharge medications.  Relevant Imaging Results:  Relevant Lab Results:   Additional Information SSN  [id number]  [name], [place]

## 2016-11-27 NOTE — Evaluation (Signed)
Physical Therapy Evaluation Patient Details Name: Matthew Bright MRN: [id number] DOB: March 31, 1926 Today's Date: 11/27/2016   History of Present Illness  Matthew Bright is an 82yo white male who comes to [facility] after being found on floor after an unwitnessed fall at [facility]. The patient has had multiple falls as of late, many related to AMB with ted hose donned on a slick floor. Imaging while in ED reveals a stable L2 compression fracture, a suspected acute compression fracture at L1, an acute R lateral femoral fracure near THA prosthesis, and a suspected pubic symphysis fracture. 2MA patient was walking community distances with RW around the outside of the facility where he resides, however over the last 4-6 weeks he has had severe LBP with gradually worsening forward flexed posture during AMB/stance, unable to get out of bed, and sleeping in the recliner x1 month. Pt was scheduled to see a neurologist in one week for this problem, as well as some BUE numbness in the hands. ED physician has stated the patient is WBAT per verbal order through the RN, and the patient is not anticipated to be admitted, to be seen by orrtho, and not considered appropriate for surgical intervention at this time. CSW arranging for direct DC to [facility] [employer]) with private pay arrangements, and PT is called to evalaute to facilitate DC.   Clinical Impression  Upon entry, the patient is received semirecumbent in bed, daughter present. The pt is somnolent but easily awakened and agreeable to participate. Pt complaining of pain in his back (chornic issue) and intermittent jerking of his muscles on the RLE position dependent) which causes his RLE pain. VSS during eval. The pt is alert and oriented x3, pleasant, conversational, and following simple and multi-step commands consistently. Strength/MMT screening reveals global impairment in the BLE and BUE. Functional mobility assessment demonstrates severe weakness, the pt now requiring  Total assist physical assistance for bed mobility, and moderate assist for transfers, unable to maintain standing for greater than 60 seconds, a strong deviation from his baseline functional mobility all performed modified indep. The patient was unable to perform bed mobility at over the last 30d due to LBP. Pt demonstrates very strong postural limitations unable to come to a complete stance, cervical lordosis near parallel to floor when standing, daughter reports this has been gradually worsening over the past few months, also due to back pain. Multiple falls in past months.    Patient presenting with impairment of strength, range of motion, balance, and activity tolerance, limiting ability to perform ADL and mobility tasks at  baseline level of function. Patient will benefit from skilled intervention to address the above impairments and limitations, in order to restore to prior level of function, improve patient safety upon discharge, and to decrease falls risk.       Follow Up Recommendations SNF    Equipment Recommendations  None recommended by PT    Recommendations for Other Services       Precautions / Restrictions Precautions Precautions: Fall Restrictions Weight Bearing Restrictions: Yes RLE Weight Bearing: Weight bearing as tolerated (Per verbal order from Dr. Reita Cliche via RN Elmo Putt. )      Mobility  Bed Mobility Overal bed mobility: Needs Assistance Bed Mobility: Supine to Sit;Sit to Supine     Supine to sit: Total assist;+2 for physical assistance;HOB elevated Sit to supine: Max assist;+2 for physical assistance;HOB elevated      Transfers Overall transfer level: Needs assistance Equipment used: Rolling walker (2 wheeled) Transfers: Sit to/from  Stand Sit to Stand: Mod assist;From elevated surface         General transfer comment: Left foot block, lift assist at pelvis. Remains completely forward flexded, with flexed knees. unable to upright (exaggerated baseline  posturing)  Ambulation/Gait Ambulation/Gait assistance:  (unable)              Stairs            Wheelchair Mobility    Modified Rankin (Stroke Patients Only)       Balance Overall balance assessment: History of Falls;Needs assistance   Sitting balance-Leahy Scale: Poor     Standing balance support: Bilateral upper extremity supported Standing balance-Leahy Scale: Poor                               Pertinent Vitals/Pain Pain Assessment: 0-10 Pain Score: 8  Pain Location: Low back pain, and intermittent RLE jerking, poorly described by patient.  Pain Intervention(s): Limited activity within patient's tolerance;Premedicated before session;Monitored during session    Home Living                        Prior Function                 Hand Dominance        Extremity/Trunk Assessment               Lower Extremity Assessment: Generalized weakness;RLE deficits/detail RLE Deficits / Details: MMT quads 3+/5; limited use in CKC. Chronic tibial varus with chronic progress ankle valgus consistent with peroneal nerve palsy.        Communication      Cognition Arousal/Alertness: Lethargic;Suspect due to medications Behavior During Therapy: [facility] for tasks assessed/performed Overall Cognitive Status: Within Functional Limits for tasks assessed                      General Comments      Exercises     Assessment/Plan    PT Assessment Patient needs continued PT services  PT Problem List Decreased strength;Decreased range of motion;Decreased activity tolerance;Decreased balance;Decreased mobility;Pain          PT Treatment Interventions DME instruction;Gait training;Functional mobility training;Therapeutic activities;Therapeutic exercise;Balance training;Patient/family education;Manual techniques    PT Goals (Current goals can be found in the Care Plan section)  Acute Rehab PT Goals Patient Stated Goal: Return to  independent AMB with AD  PT Goal Formulation: With patient Time For Goal Achievement: 12/11/16 Potential to Achieve Goals: Fair    Frequency 7X/week   Barriers to discharge Decreased caregiver support;Inaccessible home environment      Co-evaluation               End of Session   Activity Tolerance: Patient tolerated treatment well;Patient limited by fatigue;Patient limited by pain;Patient limited by lethargy Patient left: in bed;with nursing/sitter in room;with family/visitor present;with call bell/phone within reach Nurse Communication: Other (comment)    Functional Assessment Tool Used: Clinical Judgment  Functional Limitation: Mobility: Walking and moving around Mobility: Walking and Moving Around Current Status [id number]): At least 80 percent but less than 100 percent impaired, limited or restricted Mobility: Walking and Moving Around Goal Status [phone]): At least 80 percent but less than 100 percent impaired, limited or restricted    Time: 1425-1445 PT Time Calculation (min) (ACUTE ONLY): 20 min   Charges:   PT Evaluation $PT Eval Moderate Complexity: 1 Procedure  PT G Codes:   PT G-Codes **NOT FOR INPATIENT CLASS** Functional Assessment Tool Used: Clinical Judgment  Functional Limitation: Mobility: Walking and moving around Mobility: Walking and Moving Around Current Status [id number]): At least 80 percent but less than 100 percent impaired, limited or restricted Mobility: Walking and Moving Around Goal Status [phone]): At least 80 percent but less than 100 percent impaired, limited or restricted    3:19 PM, 11/27/16 Etta Grandchild, PT, DPT Physical Therapist - [place] [phone] (mobile)

## 2016-11-27 NOTE — Progress Notes (Signed)
LCSW received a call from [place] at [facility] has been offered to patient.  Family has accepted bed offer and will provide center with all medications from [facility] and has made arrangements for private pay with [facility].  LCSW consulted with Dr Reita Cliche and let her know discharge documentation and Fl2 needs to be signed by EDP.  LCSW called ED secretary and let them know patient is discharging to [facility] and Call report number is [phone]    Patient to be transported by EMS    [facility] LCSW [phone]

## 2016-11-27 NOTE — ED Provider Notes (Signed)
[facility] Emergency Department Provider Note ____________________________________________   I have reviewed the triage vital signs and the triage nursing note.  HISTORY  Chief Complaint Fall   Historian Patient  HPI Matthew Bright is a 82 y.o. male here by EMS from [facility] after fall. Patient states that he got up to walk with his walker and slipped with his new TED hose on and states they were slick as glass.  He states that he did strike the back of the head and it but a little while. He thinks it took about 45 minutes for him to get attention for someone to come help them. Denies neck pain. States that he has some chronic tingling in his right hand and occasionally the left hand but this is chronic and he's been taking gabapentin for that. He states he landed on the right hip, but is not complaining of any significant right hip pain. Denies chest pain or trouble breathing.    Symptoms are moderate.    Past Medical History:  Diagnosis Date  . Allergy   . Carotid artery disease ([place])    per CTA but no significant stenosis  . Cerebellar stroke, acute ([place]) 2008   denies residual on 07/08/2014  . Enlarged prostate   . Exertional dyspnea   . Hernia 2006  . Hyperlipidemia   . Hypertension   . OA (osteoarthritis)    "~ qwhere you can have it"  . Prostate cancer [facility])    pt denies this hx on 07/08/2014  . Special screening for malignant neoplasms, colon   . Syncope and collapse  2008; October 2012  . Varicose veins   . Vertigo 2008    Patient Active Problem List   Diagnosis Date Noted  . Chronic adrenal insufficiency ([place]) 05/03/2016  . Age related osteoporosis 05/03/2016  . Benign enlargement of prostate 05/03/2016  . Arteriosclerosis of coronary artery 05/03/2016  . Cerumen impaction 05/03/2016  . CFIDS (chronic fatigue and immune dysfunction syndrome) ([place]) 05/03/2016  . Cerebrovascular accident (CVA) ([place]) 05/03/2016  . Dizziness 05/03/2016   . Abnormal serum enzyme level 05/03/2016  . Fall 05/03/2016  . H/O adenomatous polyp of colon 05/03/2016  . BP (high blood pressure) 05/03/2016  . Arterial blood pressure decreased 05/03/2016  . HLD (hyperlipidemia) 05/03/2016  . Angulation of spine 05/03/2016  . Arthralgia of hip 05/03/2016  . Leg weakness 05/03/2016  . Spinal stenosis of lumbar region without neurogenic claudication 05/03/2016  . Impaired mobility 05/03/2016  . Muscle ache 05/03/2016  . Need for vaccination 05/03/2016  . Arthritis, degenerative 05/03/2016  . Osteopenia 05/03/2016  . Venous (peripheral) insufficiency 05/03/2016  . Encounter for screening for malignant neoplasm of prostate 05/03/2016  . Occlusion and stenosis of vertebral artery 05/03/2016  . Encounter for general adult medical examination without abnormal findings 05/03/2016  . Episode of syncope 05/03/2016  . Pes varus 05/03/2016  . Head revolving around 05/03/2016  . Avitaminosis D 05/03/2016  . Mitral regurgitation 04/17/2015  . TIA (transient ischemic attack) 07/08/2014  . Aortic valve disease 04/05/2014  . Asymptomatic varicose veins 08/16/2013  . Hyposmolality and/or hyponatremia 04/02/2013  . Syncope, vasovagal 12/08/2011  . Hypercholesterolemia 11/30/2011  . HTN (hypertension) 10/22/2011  . Abnormal EKG 10/22/2011    Past Surgical History:  Procedure Laterality Date  . ANTERIOR CERVICAL DECOMP/DISCECTOMY FUSION  11/1998   C5-C6  . CARDIAC CATHETERIZATION  02/2006   Cath at [facility]; no significant CAD noted.   Marland Kitchen CARDIOVASCULAR STRESS TEST  11/13/2007   EF 67%, NO ISCHEMIA   . CATARACT EXTRACTION W/ INTRAOCULAR LENS  IMPLANT, BILATERAL Bilateral 2010  . COLONOSCOPY  2008   Dr. Sharlett Iles in [place]  . HEMORRHOID SURGERY  1980's  . INGUINAL HERNIA REPAIR Right 2007,2009  . LUMBAR MICRODISCECTOMY  1999  . PROSTATE BIOPSY  ~ 1990; ~ 2009  . TOTAL HIP ARTHROPLASTY Bilateral 2000-2001   Dr. Percell Miller  . VEIN LIGATION AND STRIPPING Left  1953, 1968, 1980's; 1992  . VEIN LIGATION AND STRIPPING Right 1992    Prior to Admission medications   Medication Sig Start Date End Date Taking? Authorizing Provider  amLODipine (NORVASC) 5 MG tablet Take 5 mg by mouth every morning.    Historical Provider, MD  aspirin 81 MG chewable tablet Chew 81 mg by mouth every other day.    Historical Provider, MD  clopidogrel (PLAVIX) 75 MG tablet Take 75 mg by mouth daily.    Historical Provider, MD  Coenzyme Q10 (COQ10 PO) Take 1 capsule by mouth daily.     Historical Provider, MD  docusate sodium (COLACE) 100 MG capsule Take 100 mg by mouth daily as needed for mild constipation.    Historical Provider, MD  fludrocortisone (FLORINEF) 0.1 MG tablet Take 0.1 mg by mouth daily.    Historical Provider, MD  hydrochlorothiazide (HYDRODIURIL) 25 MG tablet Take 12.5 mg by mouth daily.    Historical Provider, MD  Multiple Vitamins-Minerals (CENTRUM PO) Take 1 tablet by mouth daily.     Historical Provider, MD  OVER THE COUNTER MEDICATION Apply 1 application topically at bedtime. [place] Foot Therapy Cream    Historical Provider, MD  polyethylene glycol (MIRALAX / GLYCOLAX) packet Take 17 g by mouth daily as needed.    Historical Provider, MD  potassium chloride (K-DUR) 10 MEQ tablet Take 1 tablet (10 mEq total) by mouth daily. 02/27/16   Delman Kitten, MD    Allergies  Allergen Reactions  . Avalide [Irbesartan-Hydrochlorothiazide] Other (See Comments)    Unknown  . Crestor [Rosuvastatin] Other (See Comments)    Muscle weakness in legs Reaction Unknown  . Darvocet [Propoxyphene N-Acetaminophen] Other (See Comments)    Unknown  . Lovastatin     Other reaction(s): Muscle Pain Weakness in legs Unknown  . Propoxyphene   . Zocor [Simvastatin]     Other reaction(s): Muscle Pain Weakness in legs Unknown    Family History  Problem Relation Age of Onset  . Stroke Father   . Heart attack Father     Social History Social History  Substance Use Topics   . Smoking status: Never Smoker  . Smokeless tobacco: Never Used  . Alcohol use No    Review of Systems  Constitutional: Negative for Recent illness. Eyes: Negative for visual changes. ENT: Negative for sore throat. Cardiovascular: Negative for chest pain. Respiratory: Negative for shortness of breath. Gastrointestinal: Negative for abdominal pain, vomiting and diarrhea. Genitourinary: Negative for dysuria. Musculoskeletal: Negative for back pain. Skin: Negative for rash. Neurological: Negative for headache. 10 point Review of Systems otherwise negative ____________________________________________   PHYSICAL EXAM:  VITAL SIGNS: ED Triage Vitals [11/27/16 0709]  Enc Vitals Group     BP      Pulse      Resp      Temp      Temp src      SpO2      Weight 171 lb (77.6 kg)     Height 5\' 7"  (1.702 m)     Head Circumference  Peak Flow      Pain Score      Pain Loc      Pain Edu?      Excl. in [place]?      Constitutional: Alert and oriented. Well appearing and in no distress. HEENT   Head: Normocephalic.  Posterior scalp abrasion which is hemostatic with small amount of swelling there.      Eyes: Conjunctivae are normal. PERRL. Normal extraocular movements.      Ears:         Nose: No congestion/rhinnorhea.   Mouth/Throat: Mucous membranes are moist.   Neck: No stridor.  No posterior midline tenderness to palpation or with range of motion, his range of motion is chronically somewhat limited and he has some kyphosis Cardiovascular/Chest: Normal rate, regular rhythm.  No murmurs, rubs, or gallops. Respiratory: Normal respiratory effort without tachypnea nor retractions. Breath sounds are clear and equal bilaterally. No wheezes/rales/rhonchi. Gastrointestinal: Soft. No distention, no guarding, no rebound. Nontender.  Genitourinary/rectal:Deferred Musculoskeletal: Pelvis stable. Mild discomfort in the right hip with some range of motion. No upper extremity trauma  or distal lower extremity trauma.  Neurologic:  Normal speech and language. No gross or focal neurologic deficits are appreciated. Skin:  Skin is warm, dry and intact. No rash noted. Psychiatric: Mood and affect are normal. Speech and behavior are normal. Patient exhibits appropriate insight and judgment.   ____________________________________________  LABS (pertinent positives/negatives)  Labs Reviewed - No data to display  ____________________________________________    EKG I, Lisa Roca, MD, the attending physician have personally viewed and interpreted all ECGs.  None ____________________________________________  RADIOLOGY All Xrays were viewed by me. Imaging interpreted by Radiologist.   CT without contrast: IMPRESSION: No acute intracranial findings.  Chronic ischemic microvascular disease with old left PCA territory infarct and old left caudate lacunar infarct as well as multiple small old bilateral cerebellar infarcts. Age related atrophic change.  Chronic sinus inflammatory disease.    Right hip with pelvis x-ray: IMPRESSION: Fracture along the lateral aspect of the right proximal femur just below the trochanteric region without significant displacement. Possible fracture along the right side of the superior aspect of the symphysis.  Bilateral hip arthroplasties intact.    Lumbar spine xrays:     CLINICAL DATA: Fall this morning with low back pain.  EXAM: LUMBAR SPINE - 2-3 VIEW  COMPARISON: MRI 11/04/2016 and plain films 12/08/2014  FINDINGS: Mild curvature of the lumbar spine convex left. Mild diffuse decreased bone mineralization. There is moderate spondylosis throughout the lumbar spine. No change in patient's known moderate L2 compression fracture. Subtle loss of height of L1 which is a subtle change compared to the recent MRI. Stable multilevel disc disease throughout the lumbar spine. Stable mild grade 1 anterolisthesis of L4 on L5.  Moderate facet arthropathy is present. Partial visualization of bilateral hip prostheses.  IMPRESSION: Stable moderate L2 compression fracture. Subtle loss of height of L1 which is a subtle change compared to the recent lumbar spine MRI as this may be a mild acute compression deformity.  Moderate spondylosis of the lumbar spinal multilevel disc disease.  Stable mild grade 1 anterolisthesis of L4 on L5.     Right knee xray:  IMPRESSION: No acute osseous abnormality. __________________________________________  PROCEDURES  Procedure(s) performed: None  Critical Care performed: None  ____________________________________________   ED COURSE / ASSESSMENT AND PLAN  Pertinent labs & imaging results that were available during my care of the patient were reviewed by me and considered in my  medical decision making (see chart for details).    Mr. Grober is pretty clear that he stood up and slipped. He struck his head and is on aspirin and Plavix, and I did discuss with him obtaining CT head for trauma evaluation. He is alert and without any new neurologic deficit, he does report some paresthesias in his hands which are chronic. He has no cervical spine tenderness to palpation and range of motion in his C-spine is cleared clinically.  From the standpoint of fall, it sounds mechanical rather than brought on by any emergency medical condition. On review of systems and physical exam and no additional concern for medical condition.  Lumbar xrays show old L2 compression fx, and possible acute L1 compression fracture.  Pain reasonably controlled.  Can follow up with his orthopedist Dr. Percell Miller in [place].  Hip xray shows nondisplaced femur fx alongside hip replacement (done 18 years ago).  I consulted Dr. Mack Guise, oncall orthopedist who reviewed the clinical history and exam with me, and viewed the images and felt the small fracture did not appear to be compromising the replacement, and  patient may be treated for pain and be weight bearing as tolerated with his walker.  OK for outpatient follow up with him or patient's orthopedist.  Patient unable to stand or walk on his own, will need higher level of care.  Placed social work and PT consults.  Updated patient and daughter.  I am adding on xray right knee because that's where he's hurting the most now.    SW updated family patient will be private pay for acute care rehab.  Family working on options.  Social Worker able to refer to [employer] completed by Claudine and reviewed and signed by me.  Needed additional diagnoses from today, will print this note as well.   CONSULTATIONS:  Dr. Mack Guise, orthopedics -- non operative management.   Patient / Family / Caregiver informed of clinical course, medical decision-making process, and agree with plan.   I discussed return precautions, follow-up instructions, and discharge instructions with patient and/or family.   ___________________________________________   FINAL CLINICAL IMPRESSION(S) / ED DIAGNOSES   Final diagnoses:  Fall, initial encounter  Abrasion  Minor head injury, initial encounter  Contusion of right hip, initial encounter  Lumbar compression fracture, closed, initial encounter ([place])  Fracture of right hip, closed, initial encounter [facility])  Pelvis fracture, right, closed, initial encounter [facility])              Note: This dictation was prepared with Dragon dictation. Any transcriptional errors that result from this process are unintentional    Lisa Roca, MD 11/27/16 1456

## 2016-11-27 NOTE — ED Notes (Signed)
Patient's discharge and follow up information reviewed with patient by ED nursing staff and patient given the opportunity to ask questions pertaining to ED visit and discharge plan of care. Patient advised that should symptoms not continue to improve, resolve entirely, or should new symptoms develop then a follow up visit with their PCP or a return visit to the ED may be warranted. Patient verbalized consent and understanding of discharge plan of care including potential need for further evaluation. Patient discharged in stable condition per attending ED physician on duty.   Pt to be transported to [facility] via [place].

## 2016-11-27 NOTE — ED Notes (Signed)
Per Douglass Rivers, unsure exactly how long pt was on floor but staff state it would have been less than 2 hours. Pt reports being on floor 45 min.

## 2016-11-27 NOTE — ED Notes (Signed)
Pt given [place] sandwich tray and sprite.

## 2016-11-27 NOTE — Progress Notes (Signed)
LCSW met with patient and his daughter Completed and sent information, assessment and Fl2 and Passr #[id number] [id number]  Via the HUB to SNF in the [place] area for future bed offers.   LCSW awaiting PT consult.  [employer] LCSW [phone]

## 2016-11-27 NOTE — Discharge Instructions (Addendum)
You were evaluated after fall, and your xrays show possible new Lumbar 1 compression fracture as well as the known old Lumbar 2 compression fracture, as well as a nondisplaced fracture of the hip/femur that appears stable.  There may also be a pelvis fracture.  Please walk with a walker, and you may bear weight as tolerated.  You need to see your orthopedic surgeon (or Dr. Mack Guise) in 1 week.  Return to the emergency department for any worsening symptoms including new or worsening pain, new numbness or weakness, confusion or altered mental status, chest pain or trouble breathing.  Make sure you are wearing nonslip socks or shoes when attempting to get up or walk.

## 2016-11-27 NOTE — ED Notes (Signed)
Attempted to ambulate pt with walker. Pt states his hips feel okay but he is unable to put weigh on R knee d/t pain. States this pain is new. MD notified.

## 2016-11-27 NOTE — Progress Notes (Signed)
[facility] called and are willing to make a bed offer as patient does not meet medicare 3-night qualifying stay. Prices were provided to family as per Sharyn Lull and the family is currently negotiating with [facility]. It was explained with a PT consult/recommendation medicare part B would cover pts in patient therapy while at [facility]. LCSW was asked to speak to pts son Myan Vanella and it was explained again that this patient does not meet 3 day in patient criteria for hospital stay set forth by the guidlines of medicare. Therefore it would have to be private pay at [facility].Both of children consulted and will let this LCSW know what they have decided.  LCSW discussed the other option of patient returning to [facility] with in patient PT and extra home health. Family feels it would be best for patient to go to SNF and will discuss it further  Sharyn Lull reported that once patients family decides she will be able to make a bed offer and accept patient this afternoon)   [employer] LCSW [phone]

## 2016-12-10 ENCOUNTER — Non-Acute Institutional Stay (SKILLED_NURSING_FACILITY): Payer: Medicare Other | Admitting: Gerontology

## 2016-12-10 DIAGNOSIS — M8008XD Age-related osteoporosis with current pathological fracture, vertebra(e), subsequent encounter for fracture with routine healing: Secondary | ICD-10-CM

## 2016-12-10 DIAGNOSIS — M80051D Age-related osteoporosis with current pathological fracture, right femur, subsequent encounter for fracture with routine healing: Secondary | ICD-10-CM | POA: Diagnosis not present

## 2016-12-19 DIAGNOSIS — M80051D Age-related osteoporosis with current pathological fracture, right femur, subsequent encounter for fracture with routine healing: Secondary | ICD-10-CM | POA: Insufficient documentation

## 2016-12-19 NOTE — Progress Notes (Signed)
Location:      Place of Service:  SNF (31) Provider:  Toni Arthurs, NP-C  PROVIDER NOT IN SYSTEM  Patient Care Team: Provider Not In System as PCP - General Seeplaputhur Robinette Haines, MD (General Surgery)  Extended Emergency Contact Information Primary Emergency Contact: Lawrance,Betty Address: [address]          [place], [place] [address] Johnnette Litter of [place] Phone: [phone] Relation: Spouse Secondary Emergency Contact: Salvadore Dom, [place] [address] [place] Litter of [place] Mobile Phone: [phone] Relation: Daughter  Code Status:  full Goals of care: Advanced Directive information Advanced Directives 11/27/2016  Does Patient Have a Medical Advance Directive? Yes  Type of Advance Directive Out of facility DNR (pink MOST or yellow form)  Does patient want to make changes to medical advance directive? No - Patient declined  Copy of [place] in Chart? -  Would patient like information on creating a medical advance directive? -  Pre-existing out of facility DNR order (yellow form or pink MOST form) Yellow form placed in chart (order not valid for inpatient use)     Chief Complaint  Patient presents with  . Follow-up    HPI:  Pt is a 82 y.o. male seen today for a hospital f/u s/p admission from [facility] for fall with resulting Hip/pelvic fracture, Lumbar Compression fracture. Pt also has a h/o CVA causing poor vision. Pt has been working with PT/OT for strengthening. Pt has had adequate pain control on ordered medications. Appetite is fair. Pt denies pain or dyspnea. No other complaints. VSS  Past Medical History:  Diagnosis Date  . Allergy   . Carotid artery disease ([place])    per CTA but no significant stenosis  . Cerebellar stroke, acute ([place]) 2008   denies residual on 07/08/2014  . Enlarged prostate   . Exertional dyspnea   . Hernia 2006  . Hyperlipidemia   . Hypertension   . OA (osteoarthritis)    "~ qwhere you can have  it"  . Prostate cancer [facility])    pt denies this hx on 07/08/2014  . Special screening for malignant neoplasms, colon   . Syncope and collapse  2008; October 2012  . Varicose veins   . Vertigo 2008   Past Surgical History:  Procedure Laterality Date  . ANTERIOR CERVICAL DECOMP/DISCECTOMY FUSION  11/1998   C5-C6  . CARDIAC CATHETERIZATION  02/2006   Cath at [facility]; no significant CAD noted.   Marland Kitchen CARDIOVASCULAR STRESS TEST  11/13/2007   EF 67%, NO ISCHEMIA   . CATARACT EXTRACTION W/ INTRAOCULAR LENS  IMPLANT, BILATERAL Bilateral 2010  . COLONOSCOPY  2008   Dr. Sharlett Iles in [place]  . HEMORRHOID SURGERY  1980's  . INGUINAL HERNIA REPAIR Right 2007,2009  . LUMBAR MICRODISCECTOMY  1999  . PROSTATE BIOPSY  ~ 1990; ~ 2009  . TOTAL HIP ARTHROPLASTY Bilateral 2000-2001   Dr. Percell Miller  . VEIN LIGATION AND STRIPPING Left 1953, 1968, 1980's; 1992  . VEIN LIGATION AND STRIPPING Right 1992    Allergies  Allergen Reactions  . Avalide [Irbesartan-Hydrochlorothiazide] Other (See Comments)    Unknown  . Crestor [Rosuvastatin] Other (See Comments)    Muscle weakness in legs Reaction Unknown  . Darvocet [Propoxyphene N-Acetaminophen] Other (See Comments)    Unknown  . Lovastatin     Other reaction(s): Muscle Pain Weakness in legs Unknown  . Propoxyphene   . Zocor [Simvastatin]     Other  reaction(s): Muscle Pain Weakness in legs Unknown    Allergies as of 12/10/2016      Reactions   Avalide [irbesartan-hydrochlorothiazide] Other (See Comments)   Unknown   Crestor [rosuvastatin] Other (See Comments)   Muscle weakness in legs Reaction Unknown   Darvocet [propoxyphene N-acetaminophen] Other (See Comments)   Unknown   Lovastatin    Other reaction(s): Muscle Pain Weakness in legs Unknown   Propoxyphene    Zocor [simvastatin]    Other reaction(s): Muscle Pain Weakness in legs Unknown      Medication List       Accurate as of 12/10/16 11:59 PM. Always use your most recent med list.            amLODipine 5 MG tablet Commonly known as:  NORVASC Take 5 mg by mouth every morning.   aspirin 81 MG chewable tablet Chew 81 mg by mouth every other day.   CENTRUM PO Take 1 tablet by mouth daily.   clopidogrel 75 MG tablet Commonly known as:  PLAVIX Take 75 mg by mouth daily.   COQ10 PO Take 1 capsule by mouth daily.   docusate sodium 100 MG capsule Commonly known as:  COLACE Take 100 mg by mouth daily as needed for mild constipation.   fludrocortisone 0.1 MG tablet Commonly known as:  FLORINEF Take 0.1 mg by mouth daily.   hydrochlorothiazide 25 MG tablet Commonly known as:  HYDRODIURIL Take 12.5 mg by mouth daily.   HYDROcodone-acetaminophen 5-325 MG tablet Commonly known as:  NORCO/VICODIN Take 1 tablet by mouth every 6 (six) hours as needed for moderate pain.   OVER THE COUNTER MEDICATION Apply 1 application topically at bedtime. [place]urel Foot Therapy Cream   polyethylene glycol packet Commonly known as:  MIRALAX / GLYCOLAX Take 17 g by mouth daily as needed.   potassium chloride 10 MEQ tablet Commonly known as:  K-DUR Take 1 tablet (10 mEq total) by mouth daily.       Review of Systems  Constitutional: Negative for activity change, appetite change, chills, diaphoresis and fever.  HENT: Negative for congestion, sneezing, sore throat, trouble swallowing and voice change.   Eyes: Negative.   Respiratory: Negative for apnea, cough, choking, chest tightness, shortness of breath and wheezing.   Cardiovascular: Negative for chest pain, palpitations and leg swelling.  Gastrointestinal: Negative for abdominal distention, abdominal pain, constipation, diarrhea and nausea.  Genitourinary: Negative for difficulty urinating, dysuria, frequency and urgency.  Musculoskeletal: Positive for arthralgias (typical arthritis), back pain and gait problem. Negative for myalgias.  Skin: Negative for color change, pallor, rash and wound.  Neurological: Negative for  dizziness, tremors, syncope, speech difficulty, weakness, numbness and headaches.  Psychiatric/Behavioral: Negative for agitation and behavioral problems.  All other systems reviewed and are negative.    There is no immunization history on file for this patient. Pertinent  Health Maintenance Due  Topic Date Due  . PNA vac Low Risk Adult (1 of 2 - PCV13) 06/20/1993  . INFLUENZA VACCINE  07/20/2016   No flowsheet data found. Functional Status Survey:    Vitals:   12/10/16 0530  BP: (!) 144/69  Pulse: 69  Resp: 20  Temp: 97.7 F (36.5 C)  SpO2: 97%   There is no height or weight on file to calculate BMI. Physical Exam  Constitutional: He is oriented to person, place, and time. Vital signs are normal. He appears well-developed and well-nourished. He is active and cooperative. He does not appear ill. No distress.  HENT:  Head: Normocephalic and atraumatic.  Mouth/Throat: Uvula is midline, oropharynx is clear and moist and mucous membranes are normal. Mucous membranes are not pale, not dry and not cyanotic.  Eyes: Conjunctivae, EOM and lids are normal. Pupils are equal, round, and reactive to light.  Neck: Trachea normal, normal range of motion and full passive range of motion without pain. Neck supple. No JVD present. No tracheal deviation, no edema and no erythema present. No thyromegaly present.  Cardiovascular: Normal rate, regular rhythm, intact distal pulses and normal pulses.  Exam reveals no gallop, no distant heart sounds and no friction rub.   Murmur heard. Pulses:      Dorsalis pedis pulses are 2+ on the right side, and 2+ on the left side.  Pulmonary/Chest: Effort normal. No accessory muscle usage. No respiratory distress. He has decreased breath sounds in the right lower field and the left lower field. He has no wheezes. He has no rhonchi. He has no rales. He exhibits no tenderness.  Abdominal: Normal appearance and bowel sounds are normal. He exhibits no distension and  no ascites. There is no tenderness.  Musculoskeletal: He exhibits no edema.       Right hip: He exhibits decreased range of motion and decreased strength.       Lumbar back: He exhibits decreased range of motion and tenderness.  Expected osteoarthritis, stiffness; abduction pillow in place; B-calves soft, supple. Negative homan's sign  Neurological: He is alert and oriented to person, place, and time. He has normal strength.  Skin: Skin is warm, dry and intact. He is not diaphoretic. No cyanosis. No pallor. Nails show no clubbing.  Psychiatric: He has a normal mood and affect. His speech is normal and behavior is normal. Judgment and thought content normal. Cognition and memory are normal.  Nursing note and vitals reviewed.   Labs reviewed:  Recent Labs  02/27/16 0959 05/06/16 1141 11/12/16 1129  NA 129* 128*  --   K 3.0* 3.8  --   CL 94* 92*  --   CO2 28 27  --   GLUCOSE 146* 112*  --   BUN 23* 26*  --   CREATININE 0.78 0.87 0.90  CALCIUM 8.5* 8.8  --   MG  --  1.9  --    No results for input(s): AST, ALT, ALKPHOS, BILITOT, PROT, ALBUMIN in the last 8760 hours.  Recent Labs  02/27/16 0959 05/06/16 1141  WBC 7.1 6.9  HGB 12.1* 12.7*  HCT 34.4* 36.6*  MCV 88.5 90.1  PLT 198 229   Lab Results  Component Value Date   TSH 2.152 03/19/2015   Lab Results  Component Value Date   HGBA1C 5.8 (H) 07/08/2014   Lab Results  Component Value Date   CHOL 194 05/06/2016   HDL 56 05/06/2016   LDLCALC 100 05/06/2016   TRIG 189 (H) 05/06/2016   CHOLHDL 3.5 05/06/2016    Significant Diagnostic Results in last 30 days:  Dg Lumbar Spine 2-3 Views  Result Date: 11/27/2016 CLINICAL DATA:  Fall this morning with low back pain. EXAM: LUMBAR SPINE - 2-3 VIEW COMPARISON:  MRI 11/04/2016 and plain films 12/08/2014 FINDINGS: Mild curvature of the lumbar spine convex left. Mild diffuse decreased bone mineralization. There is moderate spondylosis throughout the lumbar spine. No change in  patient's known moderate L2 compression fracture. Subtle loss of height of L1 which is a subtle change compared to the recent MRI. Stable multilevel disc disease throughout the lumbar spine. Stable mild grade 1  anterolisthesis of L4 on L5. Moderate facet arthropathy is present. Partial visualization of bilateral hip prostheses. IMPRESSION: Stable moderate L2 compression fracture. Subtle loss of height of L1 which is a subtle change compared to the recent lumbar spine MRI as this may be a mild acute compression deformity. Moderate spondylosis of the lumbar spinal multilevel disc disease. Stable mild grade 1 anterolisthesis of L4 on L5. Electronically Signed   By: Marin Olp M.D.   On: 11/27/2016 08:50   Ct Head Wo Contrast  Result Date: 11/27/2016 CLINICAL DATA:  Altered mental status with unwitnessed fall. Possible head injury. Abrasion on back of head. Patient on Plavix. Previous stroke. EXAM: CT HEAD WITHOUT CONTRAST TECHNIQUE: Contiguous axial images were obtained from the base of the skull through the vertex without intravenous contrast. COMPARISON:  02/19/2015 and 07/08/2014 FINDINGS: Brain: The ventricles, cisterns and CSF spaces are within normal. There is minimal age related atrophic change. There is moderate chronic ischemic microvascular disease present. Old left PCA territory infarct. Multiple old small bilateral cerebellar infarcts. Old left caudate lacunar infarct. No mass, mass effect, shift of midline structures or acute hemorrhage. No evidence of acute infarction. Vascular: Atherosclerotic plaque over the cavernous segment of the internal carotid arteries bilaterally. Skull: No fracture. Stable sclerotic focus over the left zygoma unchanged. Chronic stable change of the right anterior maxillary wall. Sinuses/Orbits: Orbits within normal. Complete opacification of the left frontal sinus with mild opacification of the ethmoid sinuses. IMPRESSION: No acute intracranial findings. Chronic ischemic  microvascular disease with old left PCA territory infarct and old left caudate lacunar infarct as well as multiple small old bilateral cerebellar infarcts. Age related atrophic change. Chronic sinus inflammatory disease. Electronically Signed   By: Marin Olp M.D.   On: 11/27/2016 07:51   Dg Knee Complete 4 Views Right  Result Date: 11/27/2016 CLINICAL DATA:  Right knee pain status post fall. Initial encounter. EXAM: RIGHT KNEE - COMPLETE 4+ VIEW COMPARISON:  None. FINDINGS: Normal anatomic alignment. No evidence for acute fracture or dislocation. Mild degenerative changes. Vascular calcifications. No joint effusion. IMPRESSION: No acute osseous abnormality. Electronically Signed   By: Lovey Newcomer M.D.   On: 11/27/2016 10:35   Dg Hip Unilat W Or Wo Pelvis 2-3 Views Right  Result Date: 11/27/2016 CLINICAL DATA:  Fall this morning with right hip pain. EXAM: DG HIP (WITH OR WITHOUT PELVIS) 2-3V RIGHT COMPARISON:  03/10/2016 FINDINGS: Diffuse decreased bone mineralization. Bilateral total hip arthroplasties are intact and unchanged. There is a fracture along the lateral aspect of the right proximal femur just below the trochanteric region without significant displacement. Cannot exclude a fracture along the right side of the superior aspect of the symphysis. There are degenerative changes of the spine and sacroiliac joints. IMPRESSION: Fracture along the lateral aspect of the right proximal femur just below the trochanteric region without significant displacement. Possible fracture along the right side of the superior aspect of the symphysis. Bilateral hip arthroplasties intact. Electronically Signed   By: Marin Olp M.D.   On: 11/27/2016 08:46    Assessment/Plan 1. Age-related osteoporosis with current pathol fracture of right femur, with routine healing, subsequent encounter 2. Age-related osteoporosis with current pathological fracture, vertebra(e), subsequent encounter for fracture with routine  healing  Continue to work with PT/OT for strengthening  Hydrocodone/APAP 5/325 1 tablet po Q 6 hours prn pain  OOB to chair as much as possible  Fall precautions  Safety precautions  Encourage po fluid intake  Encourage improved po food intake  for caloric support    Family/ staff Communication:   Total Time:  Documentation:  Face to Face:  Family/Phone:   Labs/tests ordered:    Vikki Ports, NP-C [facility] [address], [place] [address] Cell Phone (Mon-Fri 8am-5pm):  [phone] On Call:  [phone] & follow prompts after 5pm & weekends Office Phone:  [phone] Office Fax:  [phone]

## 2016-12-20 ENCOUNTER — Encounter
Admission: RE | Admit: 2016-12-20 | Discharge: 2016-12-20 | Disposition: A | Discharge status: Home / Self Care | Payer: Medicare Other | Source: Ambulatory Visit | Attending: Internal Medicine | Admitting: Internal Medicine

## 2016-12-27 ENCOUNTER — Non-Acute Institutional Stay (SKILLED_NURSING_FACILITY): Payer: Medicare Other | Admitting: Gerontology

## 2016-12-27 DIAGNOSIS — M80051D Age-related osteoporosis with current pathological fracture, right femur, subsequent encounter for fracture with routine healing: Secondary | ICD-10-CM

## 2016-12-27 DIAGNOSIS — M8008XD Age-related osteoporosis with current pathological fracture, vertebra(e), subsequent encounter for fracture with routine healing: Secondary | ICD-10-CM | POA: Diagnosis not present

## 2016-12-27 NOTE — Progress Notes (Signed)
Location:      Place of Service:  SNF (31) Provider:  Toni Arthurs, NP-C  PROVIDER NOT IN SYSTEM  Patient Care Team: Provider Not In System as PCP - General Seeplaputhur Robinette Haines, MD (General Surgery)  Extended Emergency Contact Information Primary Emergency Contact: Nish,Betty Address: [address]          [place], [place] [address] Johnnette Litter of [place] Phone: [phone] Relation: Spouse Secondary Emergency Contact: Salvadore Dom, [place] [address] Johnnette Litter of [place] Mobile Phone: [phone] Relation: Daughter  Code Status:  full Goals of care: Advanced Directive information Advanced Directives 11/27/2016  Does Patient Have a Medical Advance Directive? Yes  Type of Advance Directive Out of facility DNR (pink MOST or yellow form)  Does patient want to make changes to medical advance directive? No - Patient declined  Copy of [place] in Chart? -  Would patient like information on creating a medical advance directive? -  Pre-existing out of facility DNR order (yellow form or pink MOST form) Yellow form placed in chart (order not valid for inpatient use)     Chief Complaint  Patient presents with  . Follow-up    HPI:  Pt is a 83 y.o. male seen today for a follow up visit s/p admission from [facility] for fall with resulting Hip/pelvic fracture, Lumbar Compression fracture. Pt also has a h/o CVA causing poor vision. Pt has been working with PT/OT for strengthening. Pt has had adequate pain control on ordered medications. Appetite is fair. Pt denies pain or dyspnea. He is out of bed and into the wheelchair three times daily for meals. No other complaints. VSS  Past Medical History:  Diagnosis Date  . Allergy   . Carotid artery disease ([place])    per CTA but no significant stenosis  . Cerebellar stroke, acute ([place]) 2008   denies residual on 07/08/2014  . Enlarged prostate   . Exertional dyspnea   . Hernia 2006  .  Hyperlipidemia   . Hypertension   . OA (osteoarthritis)    "~ qwhere you can have it"  . Prostate cancer [facility])    pt denies this hx on 07/08/2014  . Special screening for malignant neoplasms, colon   . Syncope and collapse  2008; October 2012  . Varicose veins   . Vertigo 2008   Past Surgical History:  Procedure Laterality Date  . ANTERIOR CERVICAL DECOMP/DISCECTOMY FUSION  11/1998   C5-C6  . CARDIAC CATHETERIZATION  02/2006   Cath at [facility]; no significant CAD noted.   Marland Kitchen CARDIOVASCULAR STRESS TEST  11/13/2007   EF 67%, NO ISCHEMIA   . CATARACT EXTRACTION W/ INTRAOCULAR LENS  IMPLANT, BILATERAL Bilateral 2010  . COLONOSCOPY  2008   Dr. Sharlett Iles in [place]  . HEMORRHOID SURGERY  1980's  . INGUINAL HERNIA REPAIR Right 2007,2009  . LUMBAR MICRODISCECTOMY  1999  . PROSTATE BIOPSY  ~ 1990; ~ 2009  . TOTAL HIP ARTHROPLASTY Bilateral 2000-2001   Dr. Percell Miller  . VEIN LIGATION AND STRIPPING Left 1953, 1968, 1980's; 1992  . VEIN LIGATION AND STRIPPING Right 1992    Allergies  Allergen Reactions  . Avalide [Irbesartan-Hydrochlorothiazide] Other (See Comments)    Unknown  . Crestor [Rosuvastatin] Other (See Comments)    Muscle weakness in legs Reaction Unknown  . Darvocet [Propoxyphene N-Acetaminophen] Other (See Comments)    Unknown  . Lovastatin     Other reaction(s): Muscle Pain Weakness in  legs Unknown  . Propoxyphene   . Zocor [Simvastatin]     Other reaction(s): Muscle Pain Weakness in legs Unknown    Allergies as of 12/27/2016      Reactions   Avalide [irbesartan-hydrochlorothiazide] Other (See Comments)   Unknown   Crestor [rosuvastatin] Other (See Comments)   Muscle weakness in legs Reaction Unknown   Darvocet [propoxyphene N-acetaminophen] Other (See Comments)   Unknown   Lovastatin    Other reaction(s): Muscle Pain Weakness in legs Unknown   Propoxyphene    Zocor [simvastatin]    Other reaction(s): Muscle Pain Weakness in legs Unknown      Medication  List       Accurate as of 12/27/16  1:47 PM. Always use your most recent med list.          amLODipine 5 MG tablet Commonly known as:  NORVASC Take 5 mg by mouth every morning.   aspirin 81 MG chewable tablet Chew 81 mg by mouth every other day.   CENTRUM PO Take 1 tablet by mouth daily.   clopidogrel 75 MG tablet Commonly known as:  PLAVIX Take 75 mg by mouth daily.   COQ10 PO Take 1 capsule by mouth daily.   docusate sodium 100 MG capsule Commonly known as:  COLACE Take 100 mg by mouth daily as needed for mild constipation.   fludrocortisone 0.1 MG tablet Commonly known as:  FLORINEF Take 0.1 mg by mouth daily.   hydrochlorothiazide 25 MG tablet Commonly known as:  HYDRODIURIL Take 12.5 mg by mouth daily.   HYDROcodone-acetaminophen 5-325 MG tablet Commonly known as:  NORCO/VICODIN Take 1 tablet by mouth every 6 (six) hours as needed for moderate pain.   OVER THE COUNTER MEDICATION Apply 1 application topically at bedtime. [place] Foot Therapy Cream   polyethylene glycol packet Commonly known as:  MIRALAX / GLYCOLAX Take 17 g by mouth daily as needed.   potassium chloride 10 MEQ tablet Commonly known as:  K-DUR Take 1 tablet (10 mEq total) by mouth daily.       Review of Systems  Constitutional: Negative for activity change, appetite change, chills, diaphoresis and fever.  HENT: Negative for congestion, sneezing, sore throat, trouble swallowing and voice change.   Eyes: Negative.   Respiratory: Negative for apnea, cough, choking, chest tightness, shortness of breath and wheezing.   Cardiovascular: Negative for chest pain, palpitations and leg swelling.  Gastrointestinal: Negative for abdominal distention, abdominal pain, constipation, diarrhea and nausea.  Genitourinary: Negative for difficulty urinating, dysuria, frequency and urgency.  Musculoskeletal: Positive for arthralgias (typical arthritis), back pain and gait problem. Negative for myalgias.    Skin: Negative for color change, pallor, rash and wound.  Neurological: Negative for dizziness, tremors, syncope, speech difficulty, weakness, numbness and headaches.  Psychiatric/Behavioral: Negative for agitation and behavioral problems.  All other systems reviewed and are negative.    There is no immunization history on file for this patient. Pertinent  Health Maintenance Due  Topic Date Due  . PNA vac Low Risk Adult (1 of 2 - PCV13) 06/20/1993  . INFLUENZA VACCINE  07/20/2016   No flowsheet data found. Functional Status Survey:    Vitals:   12/27/16 0545  BP: (!) 152/64  Pulse: 61  Resp: 20  Temp: 97.9 F (36.6 C)  SpO2: 96%  Weight: 153 lb (69.4 kg)   Body mass index is 23.96 kg/m. Physical Exam  Constitutional: He is oriented to person, place, and time. Vital signs are normal. He appears well-developed  and well-nourished. He is active and cooperative. He does not appear ill. No distress.  HENT:  Head: Normocephalic and atraumatic.  Mouth/Throat: Uvula is midline, oropharynx is clear and moist and mucous membranes are normal. Mucous membranes are not pale, not dry and not cyanotic.  Eyes: Conjunctivae, EOM and lids are normal. Pupils are equal, round, and reactive to light.  Neck: Trachea normal, normal range of motion and full passive range of motion without pain. Neck supple. No JVD present. No tracheal deviation, no edema and no erythema present. No thyromegaly present.  Cardiovascular: Normal rate, regular rhythm, intact distal pulses and normal pulses.  Exam reveals no gallop, no distant heart sounds and no friction rub.   Murmur heard. Pulses:      Dorsalis pedis pulses are 2+ on the right side, and 2+ on the left side.  Pulmonary/Chest: Effort normal. No accessory muscle usage. No respiratory distress. He has decreased breath sounds in the right lower field and the left lower field. He has no wheezes. He has no rhonchi. He has no rales. He exhibits no tenderness.   Abdominal: Normal appearance and bowel sounds are normal. He exhibits no distension and no ascites. There is no tenderness.  Musculoskeletal: He exhibits no edema.       Right hip: He exhibits decreased range of motion and decreased strength.       Lumbar back: He exhibits decreased range of motion and tenderness.  Expected osteoarthritis, stiffness; abduction pillow in place; B-calves soft, supple. Negative homan's sign  Neurological: He is alert and oriented to person, place, and time. He has normal strength.  Skin: Skin is warm, dry and intact. He is not diaphoretic. No cyanosis. No pallor. Nails show no clubbing.  Psychiatric: He has a normal mood and affect. His speech is normal and behavior is normal. Judgment and thought content normal. Cognition and memory are normal.  Nursing note and vitals reviewed.   Labs reviewed:  Recent Labs  02/27/16 0959 05/06/16 1141 11/12/16 1129  NA 129* 128*  --   K 3.0* 3.8  --   CL 94* 92*  --   CO2 28 27  --   GLUCOSE 146* 112*  --   BUN 23* 26*  --   CREATININE 0.78 0.87 0.90  CALCIUM 8.5* 8.8  --   MG  --  1.9  --    No results for input(s): AST, ALT, ALKPHOS, BILITOT, PROT, ALBUMIN in the last 8760 hours.  Recent Labs  02/27/16 0959 05/06/16 1141  WBC 7.1 6.9  HGB 12.1* 12.7*  HCT 34.4* 36.6*  MCV 88.5 90.1  PLT 198 229   Lab Results  Component Value Date   TSH 2.152 03/19/2015   Lab Results  Component Value Date   HGBA1C 5.8 (H) 07/08/2014   Lab Results  Component Value Date   CHOL 194 05/06/2016   HDL 56 05/06/2016   LDLCALC 100 05/06/2016   TRIG 189 (H) 05/06/2016   CHOLHDL 3.5 05/06/2016    Significant Diagnostic Results in last 30 days:  Dg Lumbar Spine 2-3 Views  Result Date: 11/27/2016 CLINICAL DATA:  Fall this morning with low back pain. EXAM: LUMBAR SPINE - 2-3 VIEW COMPARISON:  MRI 11/04/2016 and plain films 12/08/2014 FINDINGS: Mild curvature of the lumbar spine convex left. Mild diffuse decreased  bone mineralization. There is moderate spondylosis throughout the lumbar spine. No change in patient's known moderate L2 compression fracture. Subtle loss of height of L1 which is a subtle change  compared to the recent MRI. Stable multilevel disc disease throughout the lumbar spine. Stable mild grade 1 anterolisthesis of L4 on L5. Moderate facet arthropathy is present. Partial visualization of bilateral hip prostheses. IMPRESSION: Stable moderate L2 compression fracture. Subtle loss of height of L1 which is a subtle change compared to the recent lumbar spine MRI as this may be a mild acute compression deformity. Moderate spondylosis of the lumbar spinal multilevel disc disease. Stable mild grade 1 anterolisthesis of L4 on L5. Electronically Signed   By: Marin Olp M.D.   On: 11/27/2016 08:50   Ct Head Wo Contrast  Result Date: 11/27/2016 CLINICAL DATA:  Altered mental status with unwitnessed fall. Possible head injury. Abrasion on back of head. Patient on Plavix. Previous stroke. EXAM: CT HEAD WITHOUT CONTRAST TECHNIQUE: Contiguous axial images were obtained from the base of the skull through the vertex without intravenous contrast. COMPARISON:  02/19/2015 and 07/08/2014 FINDINGS: Brain: The ventricles, cisterns and CSF spaces are within normal. There is minimal age related atrophic change. There is moderate chronic ischemic microvascular disease present. Old left PCA territory infarct. Multiple old small bilateral cerebellar infarcts. Old left caudate lacunar infarct. No mass, mass effect, shift of midline structures or acute hemorrhage. No evidence of acute infarction. Vascular: Atherosclerotic plaque over the cavernous segment of the internal carotid arteries bilaterally. Skull: No fracture. Stable sclerotic focus over the left zygoma unchanged. Chronic stable change of the right anterior maxillary wall. Sinuses/Orbits: Orbits within normal. Complete opacification of the left frontal sinus with mild  opacification of the ethmoid sinuses. IMPRESSION: No acute intracranial findings. Chronic ischemic microvascular disease with old left PCA territory infarct and old left caudate lacunar infarct as well as multiple small old bilateral cerebellar infarcts. Age related atrophic change. Chronic sinus inflammatory disease. Electronically Signed   By: Marin Olp M.D.   On: 11/27/2016 07:51   Dg Knee Complete 4 Views Right  Result Date: 11/27/2016 CLINICAL DATA:  Right knee pain status post fall. Initial encounter. EXAM: RIGHT KNEE - COMPLETE 4+ VIEW COMPARISON:  None. FINDINGS: Normal anatomic alignment. No evidence for acute fracture or dislocation. Mild degenerative changes. Vascular calcifications. No joint effusion. IMPRESSION: No acute osseous abnormality. Electronically Signed   By: Lovey Newcomer M.D.   On: 11/27/2016 10:35   Dg Hip Unilat W Or Wo Pelvis 2-3 Views Right  Result Date: 11/27/2016 CLINICAL DATA:  Fall this morning with right hip pain. EXAM: DG HIP (WITH OR WITHOUT PELVIS) 2-3V RIGHT COMPARISON:  03/10/2016 FINDINGS: Diffuse decreased bone mineralization. Bilateral total hip arthroplasties are intact and unchanged. There is a fracture along the lateral aspect of the right proximal femur just below the trochanteric region without significant displacement. Cannot exclude a fracture along the right side of the superior aspect of the symphysis. There are degenerative changes of the spine and sacroiliac joints. IMPRESSION: Fracture along the lateral aspect of the right proximal femur just below the trochanteric region without significant displacement. Possible fracture along the right side of the superior aspect of the symphysis. Bilateral hip arthroplasties intact. Electronically Signed   By: Marin Olp M.D.   On: 11/27/2016 08:46    Assessment/Plan 1. Age-related osteoporosis with current pathol fracture of right femur, with routine healing, subsequent encounter 2. Age-related osteoporosis  with current pathological fracture, vertebra(e), subsequent encounter for fracture with routine healing  Continue to work with PT/OT for strengthening  Hydrocodone/APAP 5/325 1 tablet po Q 6 hours prn pain  OOB to chair as much as possible  Fall precautions  Safety precautions  Encourage po fluid intake  Encourage improved po food intake for caloric support    Family/ staff Communication:   Total Time:  Documentation:  Face to Face:  Family/Phone:   Labs/tests ordered:    Vikki Ports, NP-C [facility] [address], [place] [address] Cell Phone (Mon-Fri 8am-5pm):  [phone] On Call:  [phone] & follow prompts after 5pm & weekends Office Phone:  [phone] Office Fax:  [phone]

## 2017-01-17 ENCOUNTER — Non-Acute Institutional Stay (SKILLED_NURSING_FACILITY): Payer: Medicare Other | Admitting: Gerontology

## 2017-01-17 DIAGNOSIS — M8008XD Age-related osteoporosis with current pathological fracture, vertebra(e), subsequent encounter for fracture with routine healing: Secondary | ICD-10-CM | POA: Diagnosis not present

## 2017-01-17 DIAGNOSIS — M80051D Age-related osteoporosis with current pathological fracture, right femur, subsequent encounter for fracture with routine healing: Secondary | ICD-10-CM

## 2017-01-17 NOTE — Progress Notes (Signed)
Location:      Place of Service:  SNF (31) Provider:  Toni Arthurs, NP-C  PROVIDER NOT IN SYSTEM  Patient Care Team: Provider Not In System as PCP - General Seeplaputhur Robinette Haines, MD (General Surgery)  Extended Emergency Contact Information Primary Emergency Contact: Lizaola,Betty Address: [address]          [place], [place] [address] Johnnette Litter of [place] Phone: [phone] Relation: Spouse Secondary Emergency Contact: Salvadore Dom, [place] [address] Johnnette Litter of [place] Mobile Phone: [phone] Relation: Daughter  Code Status:  full Goals of care: Advanced Directive information Advanced Directives 11/27/2016  Does Patient Have a Medical Advance Directive? Yes  Type of Advance Directive Out of facility DNR (pink MOST or yellow form)  Does patient want to make changes to medical advance directive? No - Patient declined  Copy of [place] in Chart? -  Would patient like information on creating a medical advance directive? -  Pre-existing out of facility DNR order (yellow form or pink MOST form) Yellow form placed in chart (order not valid for inpatient use)     Chief Complaint  Patient presents with  . Discharge Note    HPI:  Pt is a 83 y.o. male seen today for a discharge evaluation visit s/p admission from [facility] for fall with resulting Hip/pelvic fracture, Lumbar Compression fracture. Pt also has a h/o CVA causing poor vision. Pt has been working with PT/OT for strengthening. Pt has had adequate pain control on ordered medications. Appetite is fair. Pt denies pain or dyspnea. He is out of bed and into the wheelchair three times daily for meals. No other complaints. VSS. Pt reports he is nervous about going back but also ready to move forward. Pt pleasant and talkative. No complaints vocalized.   Past Medical History:  Diagnosis Date  . Allergy   . Carotid artery disease ([place])    per CTA but no significant stenosis  .  Cerebellar stroke, acute ([place]) 2008   denies residual on 07/08/2014  . Enlarged prostate   . Exertional dyspnea   . Hernia 2006  . Hyperlipidemia   . Hypertension   . OA (osteoarthritis)    "~ qwhere you can have it"  . Prostate cancer [facility])    pt denies this hx on 07/08/2014  . Special screening for malignant neoplasms, colon   . Syncope and collapse  2008; October 2012  . Varicose veins   . Vertigo 2008   Past Surgical History:  Procedure Laterality Date  . ANTERIOR CERVICAL DECOMP/DISCECTOMY FUSION  11/1998   C5-C6  . CARDIAC CATHETERIZATION  02/2006   Cath at [facility]; no significant CAD noted.   Marland Kitchen CARDIOVASCULAR STRESS TEST  11/13/2007   EF 67%, NO ISCHEMIA   . CATARACT EXTRACTION W/ INTRAOCULAR LENS  IMPLANT, BILATERAL Bilateral 2010  . COLONOSCOPY  2008   Dr. Sharlett Iles in [place]  . HEMORRHOID SURGERY  1980's  . INGUINAL HERNIA REPAIR Right 2007,2009  . LUMBAR MICRODISCECTOMY  1999  . PROSTATE BIOPSY  ~ 1990; ~ 2009  . TOTAL HIP ARTHROPLASTY Bilateral 2000-2001   Dr. Percell Miller  . VEIN LIGATION AND STRIPPING Left 1953, 1968, 1980's; 1992  . VEIN LIGATION AND STRIPPING Right 1992    Allergies  Allergen Reactions  . Avalide [Irbesartan-Hydrochlorothiazide] Other (See Comments)    Unknown  . Crestor [Rosuvastatin] Other (See Comments)    Muscle weakness in legs Reaction Unknown  .  Darvocet [Propoxyphene N-Acetaminophen] Other (See Comments)    Unknown  . Lovastatin     Other reaction(s): Muscle Pain Weakness in legs Unknown  . Propoxyphene   . Zocor [Simvastatin]     Other reaction(s): Muscle Pain Weakness in legs Unknown    Allergies as of 01/17/2017      Reactions   Avalide [irbesartan-hydrochlorothiazide] Other (See Comments)   Unknown   Crestor [rosuvastatin] Other (See Comments)   Muscle weakness in legs Reaction Unknown   Darvocet [propoxyphene N-acetaminophen] Other (See Comments)   Unknown   Lovastatin    Other reaction(s): Muscle Pain Weakness in  legs Unknown   Propoxyphene    Zocor [simvastatin]    Other reaction(s): Muscle Pain Weakness in legs Unknown      Medication List       Accurate as of 01/17/17  4:56 PM. Always use your most recent med list.          amLODipine 5 MG tablet Commonly known as:  NORVASC Take 5 mg by mouth every morning.   aspirin 81 MG chewable tablet Chew 81 mg by mouth every other day.   CENTRUM PO Take 1 tablet by mouth daily.   clopidogrel 75 MG tablet Commonly known as:  PLAVIX Take 75 mg by mouth daily.   COQ10 PO Take 1 capsule by mouth daily.   docusate sodium 100 MG capsule Commonly known as:  COLACE Take 100 mg by mouth daily as needed for mild constipation.   fludrocortisone 0.1 MG tablet Commonly known as:  FLORINEF Take 0.1 mg by mouth daily.   hydrochlorothiazide 25 MG tablet Commonly known as:  HYDRODIURIL Take 12.5 mg by mouth daily.   HYDROcodone-acetaminophen 5-325 MG tablet Commonly known as:  NORCO/VICODIN Take 1 tablet by mouth every 6 (six) hours as needed for moderate pain.   OVER THE COUNTER MEDICATION Apply 1 application topically at bedtime. [place]urel Foot Therapy Cream   polyethylene glycol packet Commonly known as:  MIRALAX / GLYCOLAX Take 17 g by mouth daily as needed.   potassium chloride 10 MEQ tablet Commonly known as:  K-DUR Take 1 tablet (10 mEq total) by mouth daily.       Review of Systems  Constitutional: Negative for activity change, appetite change, chills, diaphoresis and fever.  HENT: Negative for congestion, sneezing, sore throat, trouble swallowing and voice change.   Eyes: Negative.   Respiratory: Negative for apnea, cough, choking, chest tightness, shortness of breath and wheezing.   Cardiovascular: Negative for chest pain, palpitations and leg swelling.  Gastrointestinal: Negative for abdominal distention, abdominal pain, constipation, diarrhea and nausea.  Genitourinary: Negative for difficulty urinating, dysuria, frequency  and urgency.  Musculoskeletal: Positive for arthralgias (typical arthritis), back pain and gait problem. Negative for myalgias.  Skin: Negative for color change, pallor, rash and wound.  Neurological: Negative for dizziness, tremors, syncope, speech difficulty, weakness, numbness and headaches.  Psychiatric/Behavioral: Negative for agitation and behavioral problems.  All other systems reviewed and are negative.    There is no immunization history on file for this patient. Pertinent  Health Maintenance Due  Topic Date Due  . PNA vac Low Risk Adult (1 of 2 - PCV13) 06/20/1993  . INFLUENZA VACCINE  07/20/2016   No flowsheet data found. Functional Status Survey:    Vitals:   01/13/17 0700  BP: (!) 174/84  Pulse: 71  Resp: 18  Temp: 97.3 F (36.3 C)  SpO2: 96%  Weight: 150 lb 8 oz (68.3 kg)   Body  mass index is 23.57 kg/m. Physical Exam  Constitutional: He is oriented to person, place, and time. Vital signs are normal. He appears well-developed and well-nourished. He is active and cooperative. He does not appear ill. No distress.  HENT:  Head: Normocephalic and atraumatic.  Mouth/Throat: Uvula is midline, oropharynx is clear and moist and mucous membranes are normal. Mucous membranes are not pale, not dry and not cyanotic.  Eyes: Conjunctivae, EOM and lids are normal. Pupils are equal, round, and reactive to light.  Neck: Trachea normal, normal range of motion and full passive range of motion without pain. Neck supple. No JVD present. No tracheal deviation, no edema and no erythema present. No thyromegaly present.  Cardiovascular: Normal rate, regular rhythm, intact distal pulses and normal pulses.  Exam reveals no gallop, no distant heart sounds and no friction rub.   Murmur heard. Pulses:      Dorsalis pedis pulses are 2+ on the right side, and 2+ on the left side.  Pulmonary/Chest: Effort normal. No accessory muscle usage. No respiratory distress. He has decreased breath  sounds in the right lower field and the left lower field. He has no wheezes. He has no rhonchi. He has no rales. He exhibits no tenderness.  Abdominal: Normal appearance and bowel sounds are normal. He exhibits no distension and no ascites. There is no tenderness.  Musculoskeletal: He exhibits no edema.       Right hip: He exhibits decreased range of motion and decreased strength.       Lumbar back: He exhibits decreased range of motion and tenderness.  Expected osteoarthritis, stiffness; abduction pillow in place; B-calves soft, supple. Negative homan's sign  Neurological: He is alert and oriented to person, place, and time. He has normal strength.  Skin: Skin is warm, dry and intact. No rash noted. He is not diaphoretic. No cyanosis or erythema. No pallor. Nails show no clubbing.  Psychiatric: He has a normal mood and affect. His speech is normal and behavior is normal. Judgment and thought content normal. Cognition and memory are normal.  Nursing note and vitals reviewed.   Labs reviewed:  Recent Labs  02/27/16 0959 05/06/16 1141 11/12/16 1129  NA 129* 128*  --   K 3.0* 3.8  --   CL 94* 92*  --   CO2 28 27  --   GLUCOSE 146* 112*  --   BUN 23* 26*  --   CREATININE 0.78 0.87 0.90  CALCIUM 8.5* 8.8  --   MG  --  1.9  --    No results for input(s): AST, ALT, ALKPHOS, BILITOT, PROT, ALBUMIN in the last 8760 hours.  Recent Labs  02/27/16 0959 05/06/16 1141  WBC 7.1 6.9  HGB 12.1* 12.7*  HCT 34.4* 36.6*  MCV 88.5 90.1  PLT 198 229   Lab Results  Component Value Date   TSH 2.152 03/19/2015   Lab Results  Component Value Date   HGBA1C 5.8 (H) 07/08/2014   Lab Results  Component Value Date   CHOL 194 05/06/2016   HDL 56 05/06/2016   LDLCALC 100 05/06/2016   TRIG 189 (H) 05/06/2016   CHOLHDL 3.5 05/06/2016    Significant Diagnostic Results in last 30 days:  Dg Lumbar Spine 2-3 Views  Result Date: 11/27/2016 CLINICAL DATA:  Fall this morning with low back pain.  EXAM: LUMBAR SPINE - 2-3 VIEW COMPARISON:  MRI 11/04/2016 and plain films 12/08/2014 FINDINGS: Mild curvature of the lumbar spine convex left. Mild diffuse decreased bone  mineralization. There is moderate spondylosis throughout the lumbar spine. No change in patient's known moderate L2 compression fracture. Subtle loss of height of L1 which is a subtle change compared to the recent MRI. Stable multilevel disc disease throughout the lumbar spine. Stable mild grade 1 anterolisthesis of L4 on L5. Moderate facet arthropathy is present. Partial visualization of bilateral hip prostheses. IMPRESSION: Stable moderate L2 compression fracture. Subtle loss of height of L1 which is a subtle change compared to the recent lumbar spine MRI as this may be a mild acute compression deformity. Moderate spondylosis of the lumbar spinal multilevel disc disease. Stable mild grade 1 anterolisthesis of L4 on L5. Electronically Signed   By: Marin Olp M.D.   On: 11/27/2016 08:50   Ct Head Wo Contrast  Result Date: 11/27/2016 CLINICAL DATA:  Altered mental status with unwitnessed fall. Possible head injury. Abrasion on back of head. Patient on Plavix. Previous stroke. EXAM: CT HEAD WITHOUT CONTRAST TECHNIQUE: Contiguous axial images were obtained from the base of the skull through the vertex without intravenous contrast. COMPARISON:  02/19/2015 and 07/08/2014 FINDINGS: Brain: The ventricles, cisterns and CSF spaces are within normal. There is minimal age related atrophic change. There is moderate chronic ischemic microvascular disease present. Old left PCA territory infarct. Multiple old small bilateral cerebellar infarcts. Old left caudate lacunar infarct. No mass, mass effect, shift of midline structures or acute hemorrhage. No evidence of acute infarction. Vascular: Atherosclerotic plaque over the cavernous segment of the internal carotid arteries bilaterally. Skull: No fracture. Stable sclerotic focus over the left zygoma  unchanged. Chronic stable change of the right anterior maxillary wall. Sinuses/Orbits: Orbits within normal. Complete opacification of the left frontal sinus with mild opacification of the ethmoid sinuses. IMPRESSION: No acute intracranial findings. Chronic ischemic microvascular disease with old left PCA territory infarct and old left caudate lacunar infarct as well as multiple small old bilateral cerebellar infarcts. Age related atrophic change. Chronic sinus inflammatory disease. Electronically Signed   By: Marin Olp M.D.   On: 11/27/2016 07:51   Dg Knee Complete 4 Views Right  Result Date: 11/27/2016 CLINICAL DATA:  Right knee pain status post fall. Initial encounter. EXAM: RIGHT KNEE - COMPLETE 4+ VIEW COMPARISON:  None. FINDINGS: Normal anatomic alignment. No evidence for acute fracture or dislocation. Mild degenerative changes. Vascular calcifications. No joint effusion. IMPRESSION: No acute osseous abnormality. Electronically Signed   By: Lovey Newcomer M.D.   On: 11/27/2016 10:35   Dg Hip Unilat W Or Wo Pelvis 2-3 Views Right  Result Date: 11/27/2016 CLINICAL DATA:  Fall this morning with right hip pain. EXAM: DG HIP (WITH OR WITHOUT PELVIS) 2-3V RIGHT COMPARISON:  03/10/2016 FINDINGS: Diffuse decreased bone mineralization. Bilateral total hip arthroplasties are intact and unchanged. There is a fracture along the lateral aspect of the right proximal femur just below the trochanteric region without significant displacement. Cannot exclude a fracture along the right side of the superior aspect of the symphysis. There are degenerative changes of the spine and sacroiliac joints. IMPRESSION: Fracture along the lateral aspect of the right proximal femur just below the trochanteric region without significant displacement. Possible fracture along the right side of the superior aspect of the symphysis. Bilateral hip arthroplasties intact. Electronically Signed   By: Marin Olp M.D.   On: 11/27/2016 08:46     Assessment/Plan 1. Age-related osteoporosis with current pathol fracture of right femur, with routine healing, subsequent encounter 2. Age-related osteoporosis with current pathological fracture, vertebra(e), subsequent encounter for fracture with  routine healing  Continue to work with PT/OT for strengthening  Hydrocodone/APAP 5/325 1 tablet po Q 6 hours prn pain  OOB to chair as much as possible  Fall precautions  Safety precautions  Encourage po fluid intake  Encourage improved po food intake for caloric support  Follow up with facility MD asap for continuity of care   Family/ staff Communication:   Total Time:  Documentation:  Face to Face:  Family/Phone:  Patient is being discharged with the following home health services:  HHPT/OT/Nsg with [facility]  Patient is being discharged with the following durable medical equipment:  W/C  Patient has been advised to f/u with their PCP in 1-2 weeks to bring them up to date on their rehab stay.  Social services at facility was responsible for arranging this appointment.  Pt was provided with a 30 day supply of prescriptions for medications and refills must be obtained from their PCP.  For controlled substances, a more limited supply may be provided adequate until PCP appointment only.   Labs/tests ordered:    Vikki Ports, NP-C [facility] [phone], [place] [address] Cell Phone (Mon-Fri 8am-5pm):  [phone] On Call:  [phone] & follow prompts after 5pm & weekends Office Phone:  [phone] Office Fax:  [phone]

## 2017-01-20 ENCOUNTER — Encounter: Admission: RE | Admit: 2017-01-20 | Payer: Medicare Other | Source: Ambulatory Visit | Admitting: Internal Medicine

## 2017-01-30 ENCOUNTER — Inpatient Hospital Stay
Admission: EM | Admit: 2017-01-30 | Discharge: 2017-02-02 | DRG: 690 | Disposition: A | Discharge status: Skilled Nursing Facility | Payer: Medicare Other | Attending: Internal Medicine | Admitting: Internal Medicine

## 2017-01-30 ENCOUNTER — Encounter: Payer: Self-pay | Admitting: Emergency Medicine

## 2017-01-30 ENCOUNTER — Emergency Department: Payer: Medicare Other

## 2017-01-30 DIAGNOSIS — Z888 Allergy status to other drugs, medicaments and biological substances status: Secondary | ICD-10-CM

## 2017-01-30 DIAGNOSIS — N400 Enlarged prostate without lower urinary tract symptoms: Secondary | ICD-10-CM | POA: Diagnosis present

## 2017-01-30 DIAGNOSIS — E876 Hypokalemia: Secondary | ICD-10-CM | POA: Diagnosis present

## 2017-01-30 DIAGNOSIS — Z66 Do not resuscitate: Secondary | ICD-10-CM | POA: Diagnosis present

## 2017-01-30 DIAGNOSIS — R531 Weakness: Secondary | ICD-10-CM | POA: Diagnosis present

## 2017-01-30 DIAGNOSIS — Z823 Family history of stroke: Secondary | ICD-10-CM | POA: Diagnosis not present

## 2017-01-30 DIAGNOSIS — Z79899 Other long term (current) drug therapy: Secondary | ICD-10-CM

## 2017-01-30 DIAGNOSIS — Z9841 Cataract extraction status, right eye: Secondary | ICD-10-CM | POA: Diagnosis not present

## 2017-01-30 DIAGNOSIS — Z961 Presence of intraocular lens: Secondary | ICD-10-CM | POA: Diagnosis present

## 2017-01-30 DIAGNOSIS — N3001 Acute cystitis with hematuria: Principal | ICD-10-CM | POA: Diagnosis present

## 2017-01-30 DIAGNOSIS — L89892 Pressure ulcer of other site, stage 2: Secondary | ICD-10-CM | POA: Diagnosis present

## 2017-01-30 DIAGNOSIS — E785 Hyperlipidemia, unspecified: Secondary | ICD-10-CM | POA: Diagnosis present

## 2017-01-30 DIAGNOSIS — I10 Essential (primary) hypertension: Secondary | ICD-10-CM | POA: Diagnosis present

## 2017-01-30 DIAGNOSIS — N3000 Acute cystitis without hematuria: Secondary | ICD-10-CM | POA: Diagnosis present

## 2017-01-30 DIAGNOSIS — Z8673 Personal history of transient ischemic attack (TIA), and cerebral infarction without residual deficits: Secondary | ICD-10-CM | POA: Diagnosis not present

## 2017-01-30 DIAGNOSIS — M1990 Unspecified osteoarthritis, unspecified site: Secondary | ICD-10-CM | POA: Diagnosis present

## 2017-01-30 DIAGNOSIS — Z7902 Long term (current) use of antithrombotics/antiplatelets: Secondary | ICD-10-CM | POA: Diagnosis not present

## 2017-01-30 DIAGNOSIS — Z9842 Cataract extraction status, left eye: Secondary | ICD-10-CM | POA: Diagnosis not present

## 2017-01-30 DIAGNOSIS — I2510 Atherosclerotic heart disease of native coronary artery without angina pectoris: Secondary | ICD-10-CM | POA: Diagnosis present

## 2017-01-30 DIAGNOSIS — N390 Urinary tract infection, site not specified: Secondary | ICD-10-CM

## 2017-01-30 DIAGNOSIS — H547 Unspecified visual loss: Secondary | ICD-10-CM | POA: Diagnosis present

## 2017-01-30 DIAGNOSIS — Z8249 Family history of ischemic heart disease and other diseases of the circulatory system: Secondary | ICD-10-CM | POA: Diagnosis not present

## 2017-01-30 DIAGNOSIS — Z96643 Presence of artificial hip joint, bilateral: Secondary | ICD-10-CM | POA: Diagnosis present

## 2017-01-30 DIAGNOSIS — L8990 Pressure ulcer of unspecified site, unspecified stage: Secondary | ICD-10-CM | POA: Diagnosis present

## 2017-01-30 DIAGNOSIS — R319 Hematuria, unspecified: Secondary | ICD-10-CM

## 2017-01-30 DIAGNOSIS — M6281 Muscle weakness (generalized): Secondary | ICD-10-CM

## 2017-01-30 LAB — COMPREHENSIVE METABOLIC PANEL
ALK PHOS: 103 U/L (ref 38–126)
ALT: 17 U/L (ref 17–63)
ANION GAP: 7 (ref 5–15)
AST: 24 U/L (ref 15–41)
Albumin: 3.6 g/dL (ref 3.5–5.0)
BILIRUBIN TOTAL: 0.8 mg/dL (ref 0.3–1.2)
BUN: 25 mg/dL — ABNORMAL HIGH (ref 6–20)
CALCIUM: 8.7 mg/dL — AB (ref 8.9–10.3)
CO2: 27 mmol/L (ref 22–32)
CREATININE: 0.57 mg/dL — AB (ref 0.61–1.24)
Chloride: 103 mmol/L (ref 101–111)
Glucose, Bld: 83 mg/dL (ref 65–99)
Potassium: 3.5 mmol/L (ref 3.5–5.1)
Sodium: 137 mmol/L (ref 135–145)
TOTAL PROTEIN: 6.3 g/dL — AB (ref 6.5–8.1)

## 2017-01-30 LAB — URINALYSIS, COMPLETE (UACMP) WITH MICROSCOPIC
Bilirubin Urine: NEGATIVE
GLUCOSE, UA: NEGATIVE mg/dL
HGB URINE DIPSTICK: NEGATIVE
Ketones, ur: NEGATIVE mg/dL
NITRITE: POSITIVE — AB
Protein, ur: NEGATIVE mg/dL
SPECIFIC GRAVITY, URINE: 1.013 (ref 1.005–1.030)
Squamous Epithelial / LPF: NONE SEEN
pH: 7 (ref 5.0–8.0)

## 2017-01-30 LAB — LACTIC ACID, PLASMA: Lactic Acid, Venous: 0.8 mmol/L (ref 0.5–1.9)

## 2017-01-30 LAB — CBC WITH DIFFERENTIAL/PLATELET
Basophils Absolute: 0 10*3/uL (ref 0–0.1)
Basophils Relative: 0 %
EOS ABS: 0.3 10*3/uL (ref 0–0.7)
Eosinophils Relative: 2 %
HCT: 36.5 % — ABNORMAL LOW (ref 40.0–52.0)
HEMOGLOBIN: 12.7 g/dL — AB (ref 13.0–18.0)
LYMPHS ABS: 1 10*3/uL (ref 1.0–3.6)
LYMPHS PCT: 8 %
MCH: 31.9 pg (ref 26.0–34.0)
MCHC: 34.7 g/dL (ref 32.0–36.0)
MCV: 92 fL (ref 80.0–100.0)
MONOS PCT: 7 %
Monocytes Absolute: 0.8 10*3/uL (ref 0.2–1.0)
NEUTROS PCT: 83 %
Neutro Abs: 9.5 10*3/uL — ABNORMAL HIGH (ref 1.4–6.5)
Platelets: 199 10*3/uL (ref 150–440)
RBC: 3.97 MIL/uL — ABNORMAL LOW (ref 4.40–5.90)
RDW: 14.1 % (ref 11.5–14.5)
WBC: 11.6 10*3/uL — ABNORMAL HIGH (ref 3.8–10.6)

## 2017-01-30 LAB — TROPONIN I: TROPONIN I: 0.03 ng/mL — AB (ref <0.03)

## 2017-01-30 LAB — MRSA PCR SCREENING: MRSA by PCR: NEGATIVE

## 2017-01-30 MED ORDER — VITAMIN D 1000 UNITS PO TABS
4000.0000 [IU] | ORAL_TABLET | Freq: Every day | ORAL | Status: DC
  Administered 2017-01-31 – 2017-02-02 (×3): 4000 [IU] via ORAL
  Filled 2017-01-30 (×4): qty 4

## 2017-01-30 MED ORDER — ONDANSETRON HCL 4 MG PO TABS: 4.0000 mg | ORAL_TABLET | Freq: Four times a day (QID) | ORAL | Status: DC | PRN

## 2017-01-30 MED ORDER — POLYETHYLENE GLYCOL 3350 17 G PO PACK: 17.0000 g | PACK | Freq: Every day | ORAL | Status: DC | PRN

## 2017-01-30 MED ORDER — CLOPIDOGREL BISULFATE 75 MG PO TABS
75.0000 mg | ORAL_TABLET | Freq: Every day | ORAL | Status: DC
  Administered 2017-01-31 – 2017-02-02 (×3): 75 mg via ORAL
  Filled 2017-01-30 (×3): qty 1

## 2017-01-30 MED ORDER — DEXTROSE 5 % IV SOLN: 1.0000 g | INTRAVENOUS | Status: DC

## 2017-01-30 MED ORDER — HYDROCODONE-ACETAMINOPHEN 5-325 MG PO TABS: 1.0000 | ORAL_TABLET | Freq: Four times a day (QID) | ORAL | Status: DC | PRN

## 2017-01-30 MED ORDER — CEFTRIAXONE SODIUM-DEXTROSE 1-3.74 GM-% IV SOLR
1.0000 g | Freq: Once | INTRAVENOUS | Status: AC
  Administered 2017-01-30: 1 g via INTRAVENOUS
  Filled 2017-01-30: qty 50

## 2017-01-30 MED ORDER — ENOXAPARIN SODIUM 40 MG/0.4ML ~~LOC~~ SOLN
40.0000 mg | SUBCUTANEOUS | Status: DC
  Administered 2017-01-30 – 2017-02-01 (×3): 40 mg via SUBCUTANEOUS
  Filled 2017-01-30 (×3): qty 0.4

## 2017-01-30 MED ORDER — DEXTROSE 5 % IV SOLN: 1.0000 g | Freq: Once | INTRAVENOUS | Status: DC

## 2017-01-30 MED ORDER — POTASSIUM CHLORIDE CRYS ER 10 MEQ PO TBCR
10.0000 meq | EXTENDED_RELEASE_TABLET | Freq: Every day | ORAL | Status: DC
  Administered 2017-01-31 – 2017-02-02 (×3): 10 meq via ORAL
  Filled 2017-01-30 (×4): qty 1

## 2017-01-30 MED ORDER — ACETAMINOPHEN 325 MG PO TABS
650.0000 mg | ORAL_TABLET | Freq: Once | ORAL | Status: AC
  Administered 2017-01-30: 650 mg via ORAL
  Filled 2017-01-30: qty 2

## 2017-01-30 MED ORDER — ADULT MULTIVITAMIN W/MINERALS CH
1.0000 | ORAL_TABLET | Freq: Every day | ORAL | Status: DC
  Administered 2017-01-31 – 2017-02-02 (×3): 1 via ORAL
  Filled 2017-01-30 (×3): qty 1

## 2017-01-30 MED ORDER — DOCUSATE SODIUM 100 MG PO CAPS
100.0000 mg | ORAL_CAPSULE | Freq: Two times a day (BID) | ORAL | Status: DC
  Administered 2017-01-30 – 2017-02-02 (×6): 100 mg via ORAL
  Filled 2017-01-30 (×6): qty 1

## 2017-01-30 MED ORDER — SODIUM CHLORIDE 0.9 % IV SOLN
INTRAVENOUS | Status: AC
  Administered 2017-01-30 – 2017-01-31 (×2): via INTRAVENOUS

## 2017-01-30 MED ORDER — TAMSULOSIN HCL 0.4 MG PO CAPS
0.4000 mg | ORAL_CAPSULE | Freq: Every day | ORAL | Status: DC
  Administered 2017-01-30 – 2017-02-02 (×4): 0.4 mg via ORAL
  Filled 2017-01-30 (×4): qty 1

## 2017-01-30 MED ORDER — DEXTROSE 5 % IV SOLN
1.0000 g | INTRAVENOUS | Status: DC
  Administered 2017-01-31: 1 g via INTRAVENOUS
  Filled 2017-01-30 (×2): qty 10

## 2017-01-30 MED ORDER — ONDANSETRON HCL 4 MG/2ML IJ SOLN: 4.0000 mg | Freq: Four times a day (QID) | INTRAMUSCULAR | Status: DC | PRN

## 2017-01-30 NOTE — Progress Notes (Signed)
LCSW was asked to meet with family to discuss other SNF and higher level of care. LCSW reviewed with patient and his daughter that they are to meet with [facility]  Director and their SW/admission coordinator and  Need to request a full assessment to see if her fathers level of care has changed. The facility staff are also to assist family in finding a SNF to meet the patients changing needs.   LCSW provided SNF- resource lists and Elder Care hand out to patients daughter. She was polite and understood that ED doctors have a specific role and long term HCP know the patients needs better. LCSW offered daughter beverages and asked If I could assist further.   No further needs   [employer] LCSW [phone]

## 2017-01-30 NOTE — H&P (Signed)
[facility] [place] NAME: Matthew Bright    MR#:  [id number]  DATE OF BIRTH:  November 09, 1926  DATE OF ADMISSION:  01/30/2017  PRIMARY CARE PHYSICIAN: PROVIDER NOT IN SYSTEM   REQUESTING/REFERRING PHYSICIAN: Lisa Roca, MD  CHIEF COMPLAINT:  Generalized weakness  HISTORY OF PRESENT ILLNESS:  Matthew Bright  is a 83 y.o. male with a known history of Hypertension, hyperlipidemia, benign prostatic hypertrophy and was brought in from [facility] for worsening of weakness. Patient states that his arms and legs are so weak to get out of the bed. Daughter is concerned. Denies any nausea or vomiting. Denies any fever. Patient is reporting frequent urination. In the emergency department urine looks abnormal  PAST MEDICAL HISTORY:   Past Medical History:  Diagnosis Date  . Allergy   . Carotid artery disease ([place])    per CTA but no significant stenosis  . Cerebellar stroke, acute ([place]) 2008   denies residual on 07/08/2014  . Enlarged prostate   . Exertional dyspnea   . Hernia 2006  . Hyperlipidemia   . Hypertension   . OA (osteoarthritis)    "~ qwhere you can have it"  . Prostate cancer [facility])    pt denies this hx on 07/08/2014  . Special screening for malignant neoplasms, colon   . Syncope and collapse  2008; October 2012  . Varicose veins   . Vertigo 2008    PAST SURGICAL HISTOIRY:   Past Surgical History:  Procedure Laterality Date  . ANTERIOR CERVICAL DECOMP/DISCECTOMY FUSION  11/1998   C5-C6  . CARDIAC CATHETERIZATION  02/2006   Cath at [facility]; no significant CAD noted.   Marland Kitchen CARDIOVASCULAR STRESS TEST  11/13/2007   EF 67%, NO ISCHEMIA   . CATARACT EXTRACTION W/ INTRAOCULAR LENS  IMPLANT, BILATERAL Bilateral 2010  . COLONOSCOPY  2008   Dr. Sharlett Iles in [place]  . HEMORRHOID SURGERY  1980's  . INGUINAL HERNIA REPAIR Right 2007,2009  . LUMBAR MICRODISCECTOMY  1999  . PROSTATE BIOPSY  ~ 1990; ~ 2009  . TOTAL HIP ARTHROPLASTY  Bilateral 2000-2001   Dr. Percell Miller  . VEIN LIGATION AND STRIPPING Left 1953, 1968, 1980's; 1992  . VEIN LIGATION AND STRIPPING Right 1992    SOCIAL HISTORY:   Social History  Substance Use Topics  . Smoking status: Never Smoker  . Smokeless tobacco: Never Used  . Alcohol use No    FAMILY HISTORY:   Family History  Problem Relation Age of Onset  . Stroke Father   . Heart attack Father     DRUG ALLERGIES:   Allergies  Allergen Reactions  . Avalide [Irbesartan-Hydrochlorothiazide] Other (See Comments)    Unknown  . Crestor [Rosuvastatin] Other (See Comments)    Muscle weakness in legs Reaction Unknown  . Darvocet [Propoxyphene N-Acetaminophen] Other (See Comments)    Unknown  . Lovastatin     Other reaction(s): Muscle Pain Weakness in legs Unknown  . Propoxyphene   . Zocor [Simvastatin]     Other reaction(s): Muscle Pain Weakness in legs Unknown    REVIEW OF SYSTEMS:  CONSTITUTIONAL: No fever, fatigue . Patient reporting generalized weakness.  EYES: No blurred or double vision.  EARS, NOSE, AND THROAT: No tinnitus or ear pain.  RESPIRATORY: No cough, shortness of breath, wheezing or hemoptysis.  CARDIOVASCULAR: No chest pain, orthopnea, edema.  GASTROINTESTINAL: No nausea, vomiting, diarrhea or abdominal pain.  GENITOURINARY: No dysuria, hematuria.  ENDOCRINE: No polyuria, nocturia,  HEMATOLOGY: No anemia,  easy bruising or bleeding SKIN: No rash or lesion. MUSCULOSKELETAL: No joint pain or arthritis.   NEUROLOGIC: No tingling, numbness, Reporting worsening weakness.  PSYCHIATRY: No anxiety or depression.   MEDICATIONS AT HOME:   Prior to Admission medications   Medication Sig Start Date End Date Taking? Authorizing Provider  Cholecalciferol 2000 units CAPS Take 4,000 Units by mouth daily.   Yes Historical Provider, MD  clopidogrel (PLAVIX) 75 MG tablet Take 75 mg by mouth daily.   Yes Historical Provider, MD  Coenzyme Q10 (COQ10 PO) Take 1 capsule by mouth  daily.    Yes Historical Provider, MD  fludrocortisone (FLORINEF) 0.1 MG tablet Take 0.1 mg by mouth daily.   Yes Historical Provider, MD  HYDROcodone-acetaminophen (NORCO/VICODIN) 5-325 MG tablet Take 1 tablet by mouth every 6 (six) hours as needed for moderate pain. 11/27/16  Yes Lisa Roca, MD  Multiple Vitamins-Minerals (CENTRUM PO) Take 1 tablet by mouth daily.    Yes Historical Provider, MD  polyethylene glycol (MIRALAX / GLYCOLAX) packet Take 17 g by mouth daily as needed.   Yes Historical Provider, MD  potassium chloride (K-DUR) 10 MEQ tablet Take 1 tablet (10 mEq total) by mouth daily. 02/27/16  Yes Delman Kitten, MD  Skin Protectants, Misc. (MINERIN) CREA Apply 1 application topically as needed. Apply liberal amount topically to dry skin as needed.   Yes Historical Provider, MD      VITAL SIGNS:  Blood pressure (!) 164/87, pulse 85, temperature 97.5 F (36.4 C), temperature source Oral, resp. rate 16, weight 68.3 kg (150 lb 8 oz), SpO2 98 %.  PHYSICAL EXAMINATION:  GENERAL:  83 y.o.-year-old patient lying in the bed with no acute distress.  EYES: Pupils equal, round, reactive to light and accommodation. No scleral icterus. Extraocular muscles intact.  HEENT: Head atraumatic, normocephalic. Oropharynx and nasopharynx clear.  NECK:  Supple, no jugular venous distention. No thyroid enlargement, no tenderness.  LUNGS: Normal breath sounds bilaterally, no wheezing, rales,rhonchi or crepitation. No use of accessory muscles of respiration.  CARDIOVASCULAR: S1, S2 normal. No murmurs, rubs, or gallops.  ABDOMEN: Soft, nontender, nondistended. Bowel sounds present. No organomegaly or mass.  EXTREMITIES: No pedal edema, cyanosis, or clubbing.  NEUROLOGIC: Cranial nerves II through XII are intact. Muscle strength 2-3/5 in all extremities. Sensation intact. Gait not checked.  PSYCHIATRIC: The patient is alert and oriented x 3.  SKIN: No obvious rash, lesion, or ulcer.   LABORATORY PANEL:    CBC  Recent Labs Lab 01/30/17 0940  WBC 11.6*  HGB 12.7*  HCT 36.5*  PLT 199   ------------------------------------------------------------------------------------------------------------------  Chemistries   Recent Labs Lab 01/30/17 0940  NA 137  K 3.5  CL 103  CO2 27  GLUCOSE 83  BUN 25*  CREATININE 0.57*  CALCIUM 8.7*  AST 24  ALT 17  ALKPHOS 103  BILITOT 0.8   ------------------------------------------------------------------------------------------------------------------  Cardiac Enzymes  Recent Labs Lab 01/30/17 0940  TROPONINI 0.03*   ------------------------------------------------------------------------------------------------------------------  RADIOLOGY:  Ct Head Wo Contrast  Result Date: 01/30/2017 CLINICAL DATA:  Worsening right arm weakness. EXAM: CT HEAD WITHOUT CONTRAST TECHNIQUE: Contiguous axial images were obtained from the base of the skull through the vertex without intravenous contrast. COMPARISON:  11/27/2016 FINDINGS: Brain: Old left occipital infarct with encephalomalacia. Old bilateral cerebellar infarcts. There is atrophy and chronic small vessel disease changes. No acute intracranial abnormality. Specifically, no hemorrhage, hydrocephalus, mass lesion, acute infarction, or significant intracranial injury. Vascular: No hyperdense vessel or unexpected calcification. Skull: No acute calvarial abnormality. Sinuses/Orbits:  Complete opacification of the left frontal sinus and ethmoid air cells. Mucosal thickening in the right ethmoid air cells and left maxillary sinus. Findings stable. Mastoids are clear. Other: None IMPRESSION: Old left occipital and bilateral cerebellar infarcts. No acute intracranial abnormality. Atrophy, chronic microvascular disease. Electronically Signed   By: Rolm Baptise M.D.   On: 01/30/2017 10:15    EKG:   Orders placed or performed during the hospital encounter of 01/30/17  . ED EKG  . ED EKG    IMPRESSION  AND PLAN:   Gerber Solanki  is a 83 y.o. male with a known history of Hypertension, hyperlipidemia, benign prostatic hypertrophy and was brought in from [facility] for worsening of weakness. Patient states that his arms and legs are so weak to get out of the bed.  # Generalized weakness secondary to acute cystitis MedSurg unit admission Provide IV fluids IV Rocephin Follow up in the urine culture and sensitivity Follow-up blood cultures Repeat a.m. Labs  #Generalized weakness with a chronic neck and chronic left-sided weakness Symptomatic treatment supportive measures provide IV fluids PT consult Outpatient follow-up with orthopedics as recommended   #bph - flomax  # Hyperlipidemia  check fasting lipid panel   DO NOT RESUSCITATE, brother and sister are  the healthcare power of attorney   All the records are reviewed and case discussed with ED provider. Management plans discussed with the patient, family and they are in agreement.  CODE STATUS: dnr/brother and daughter is HCPOA  TOTAL TIME TAKING CARE OF THIS PATIENT: 45 minutes.   Note: This dictation was prepared with Dragon dictation along with smaller phrase technology. Any transcriptional errors that result from this process are unintentional.  Nicholes Mango M.D on 01/30/2017 at 3:23 PM  Between 7am to 6pm - Pager - [phone]  After 6pm go to [email or web address] - password [email or web address] [facility]  Office  [phone]  CC: Primary care physician; PROVIDER NOT IN SYSTEM

## 2017-01-30 NOTE — Progress Notes (Signed)
LCSW completed patients assessment and started Fl2 and obtained Passr number.   Awaiting PT and OT consult.   Sent CSW assessment and Fl2 via the hub to SNF for future bed offers.  Family [phone])  Son is Ronalee Belts [phone]  Enis Slipper LCSW [phone]

## 2017-01-30 NOTE — ED Provider Notes (Signed)
[facility] Emergency Department Provider Note ____________________________________________   I have reviewed the triage vital signs and the triage nursing note.  HISTORY  Chief Complaint Weakness   Historian Patient  HPI Matthew Bright is a 83 y.o. male from [facility], presents by EMS after patient reportedly requested to be brought in for feeling weak. He states that he just feels weak all over. He states that this has been going on for weeks if not months. He states at times his arms and legs just feel too weak to get up. Denies recent illnesses including fever or coughing or congestion, nausea, vomiting, diarrhea, or abdominal pain. Reports that he urinated 4 times overnight in his diaper, unclear if this is normal or not. Denies any recent traumatic injury or fall. Symptoms are mild.  History of prior strokes, patient is not clear on what symptoms he had after this was diagnosed.   Past Medical History:  Diagnosis Date  . Allergy   . Carotid artery disease ([place])    per CTA but no significant stenosis  . Cerebellar stroke, acute ([place]) 2008   denies residual on 07/08/2014  . Enlarged prostate   . Exertional dyspnea   . Hernia 2006  . Hyperlipidemia   . Hypertension   . OA (osteoarthritis)    "~ qwhere you can have it"  . Prostate cancer [facility])    pt denies this hx on 07/08/2014  . Special screening for malignant neoplasms, colon   . Syncope and collapse  2008; October 2012  . Varicose veins   . Vertigo 2008    Patient Active Problem List   Diagnosis Date Noted  . Age-related osteoporosis with current pathol fracture of right femur, with routine healing, subsequent encounter 12/19/2016  . Chronic adrenal insufficiency ([place]) 05/03/2016  . Age-related osteoporosis with current pathological fracture, vertebra(e), subsequent encounter for fracture with routine healing 05/03/2016  . Benign enlargement of prostate 05/03/2016  . Arteriosclerosis of  coronary artery 05/03/2016  . Cerumen impaction 05/03/2016  . CFIDS (chronic fatigue and immune dysfunction syndrome) ([place]) 05/03/2016  . Cerebrovascular accident (CVA) ([place]) 05/03/2016  . Dizziness 05/03/2016  . Abnormal serum enzyme level 05/03/2016  . Fall 05/03/2016  . H/O adenomatous polyp of colon 05/03/2016  . BP (high blood pressure) 05/03/2016  . Arterial blood pressure decreased 05/03/2016  . HLD (hyperlipidemia) 05/03/2016  . Angulation of spine 05/03/2016  . Arthralgia of hip 05/03/2016  . Leg weakness 05/03/2016  . Spinal stenosis of lumbar region without neurogenic claudication 05/03/2016  . Impaired mobility 05/03/2016  . Muscle ache 05/03/2016  . Need for vaccination 05/03/2016  . Arthritis, degenerative 05/03/2016  . Osteopenia 05/03/2016  . Venous (peripheral) insufficiency 05/03/2016  . Encounter for screening for malignant neoplasm of prostate 05/03/2016  . Occlusion and stenosis of vertebral artery 05/03/2016  . Encounter for general adult medical examination without abnormal findings 05/03/2016  . Episode of syncope 05/03/2016  . Pes varus 05/03/2016  . Head revolving around 05/03/2016  . Avitaminosis D 05/03/2016  . Mitral regurgitation 04/17/2015  . TIA (transient ischemic attack) 07/08/2014  . Aortic valve disease 04/05/2014  . Asymptomatic varicose veins 08/16/2013  . Hyposmolality and/or hyponatremia 04/02/2013  . Syncope, vasovagal 12/08/2011  . Hypercholesterolemia 11/30/2011  . HTN (hypertension) 10/22/2011  . Abnormal EKG 10/22/2011    Past Surgical History:  Procedure Laterality Date  . ANTERIOR CERVICAL DECOMP/DISCECTOMY FUSION  11/1998   C5-C6  . CARDIAC CATHETERIZATION  02/2006   Cath at [facility];  no significant CAD noted.   Marland Kitchen CARDIOVASCULAR STRESS TEST  11/13/2007   EF 67%, NO ISCHEMIA   . CATARACT EXTRACTION W/ INTRAOCULAR LENS  IMPLANT, BILATERAL Bilateral 2010  . COLONOSCOPY  2008   Dr. Sharlett Iles in [place]  . HEMORRHOID SURGERY   1980's  . INGUINAL HERNIA REPAIR Right 2007,2009  . LUMBAR MICRODISCECTOMY  1999  . PROSTATE BIOPSY  ~ 1990; ~ 2009  . TOTAL HIP ARTHROPLASTY Bilateral 2000-2001   Dr. Percell Miller  . VEIN LIGATION AND STRIPPING Left 1953, 1968, 1980's; 1992  . VEIN LIGATION AND STRIPPING Right 1992    Prior to Admission medications   Medication Sig Start Date End Date Taking? Authorizing Provider  Cholecalciferol 2000 units CAPS Take 4,000 Units by mouth daily.   Yes Historical Provider, MD  clopidogrel (PLAVIX) 75 MG tablet Take 75 mg by mouth daily.   Yes Historical Provider, MD  Coenzyme Q10 (COQ10 PO) Take 1 capsule by mouth daily.    Yes Historical Provider, MD  fludrocortisone (FLORINEF) 0.1 MG tablet Take 0.1 mg by mouth daily.   Yes Historical Provider, MD  HYDROcodone-acetaminophen (NORCO/VICODIN) 5-325 MG tablet Take 1 tablet by mouth every 6 (six) hours as needed for moderate pain. 11/27/16  Yes Lisa Roca, MD  Multiple Vitamins-Minerals (CENTRUM PO) Take 1 tablet by mouth daily.    Yes Historical Provider, MD  polyethylene glycol (MIRALAX / GLYCOLAX) packet Take 17 g by mouth daily as needed.   Yes Historical Provider, MD  potassium chloride (K-DUR) 10 MEQ tablet Take 1 tablet (10 mEq total) by mouth daily. 02/27/16  Yes Delman Kitten, MD  Skin Protectants, Misc. (MINERIN) CREA Apply 1 application topically as needed. Apply liberal amount topically to dry skin as needed.   Yes Historical Provider, MD    Allergies  Allergen Reactions  . Avalide [Irbesartan-Hydrochlorothiazide] Other (See Comments)    Unknown  . Crestor [Rosuvastatin] Other (See Comments)    Muscle weakness in legs Reaction Unknown  . Darvocet [Propoxyphene N-Acetaminophen] Other (See Comments)    Unknown  . Lovastatin     Other reaction(s): Muscle Pain Weakness in legs Unknown  . Propoxyphene   . Zocor [Simvastatin]     Other reaction(s): Muscle Pain Weakness in legs Unknown    Family History  Problem Relation Age of  Onset  . Stroke Father   . Heart attack Father     Social History Social History  Substance Use Topics  . Smoking status: Never Smoker  . Smokeless tobacco: Never Used  . Alcohol use No    Review of Systems  Constitutional: Negative for fever. Eyes: Negative for visual changes. ENT: Negative for sore throat. Cardiovascular: Negative for chest pain. Respiratory: Negative for shortness of breath. Gastrointestinal: Negative for abdominal pain, vomiting and diarrhea. Genitourinary: Negative for dysuria. Musculoskeletal: Negative for back pain. Skin: Negative for rash. Neurological: Negative for headache. 10 point Review of Systems otherwise negative ____________________________________________   PHYSICAL EXAM:  VITAL SIGNS: ED Triage Vitals  Enc Vitals Group     BP      Pulse      Resp      Temp      Temp src      SpO2      Weight      Height      Head Circumference      Peak Flow      Pain Score      Pain Loc      Pain Edu?  Excl. in [place]?      Constitutional: Alert and oriented, cooperative. Well appearing and in no distress. HEENT   Head: Normocephalic and atraumatic.      Eyes: Conjunctivae are normal. PERRL. Normal extraocular movements.      Ears:         Nose: No congestion/rhinnorhea.   Mouth/Throat: Mucous membranes are moist.   Neck: No stridor. Cardiovascular/Chest: Normal rate, regular rhythm.  No murmurs, rubs, or gallops. Respiratory: Normal respiratory effort without tachypnea nor retractions. Breath sounds are clear and equal bilaterally. No wheezes/rales/rhonchi. Gastrointestinal: Soft. No distention, no guarding, no rebound. Nontender.   Genitourinary/rectal:Deferred Musculoskeletal: Nontender with normal range of motion in all extremities. No joint effusions.  No lower extremity tenderness.  No edema. Neurologic:  Normal speech and language.  Soft voice, but no slurred speech, or aphasia. No facial droop. He does seem to have  generalized weakness all over and is somewhat slow to lift his legs and his arms when I ask for strength testing. However, there is no trouble with other than his right arm which seems to be weaker than the left arm. No sensory deficit. Skin:  Skin is warm, dry and intact. No rash noted. Psychiatric: Mood and affect are normal. Speech and behavior are normal. Patient exhibits appropriate insight and judgment.   ____________________________________________  LABS (pertinent positives/negatives)  Labs Reviewed  CBC WITH DIFFERENTIAL/PLATELET - Abnormal; Notable for the following:       Result Value   WBC 11.6 (*)    RBC 3.97 (*)    Hemoglobin 12.7 (*)    HCT 36.5 (*)    Neutro Abs 9.5 (*)    All other components within normal limits  COMPREHENSIVE METABOLIC PANEL - Abnormal; Notable for the following:    BUN 25 (*)    Creatinine, Ser 0.57 (*)    Calcium 8.7 (*)    Total Protein 6.3 (*)    All other components within normal limits  TROPONIN I - Abnormal; Notable for the following:    Troponin I 0.03 (*)    All other components within normal limits  URINALYSIS, COMPLETE (UACMP) WITH MICROSCOPIC - Abnormal; Notable for the following:    Color, Urine YELLOW (*)    APPearance CLOUDY (*)    Nitrite POSITIVE (*)    Leukocytes, UA MODERATE (*)    Bacteria, UA RARE (*)    All other components within normal limits  URINE CULTURE  CULTURE, BLOOD (ROUTINE X 2)  CULTURE, BLOOD (ROUTINE X 2)    ____________________________________________    EKG I, Lisa Roca, MD, the attending physician have personally viewed and interpreted all ECGs.  71 bpm. Normal sinus rhythm. Left axis deviation. Nonspecific ST and T-wave ____________________________________________  RADIOLOGY All Xrays were viewed by me. Imaging interpreted by Radiologist.  CT head without contrast:  IMPRESSION: Old left occipital and bilateral cerebellar infarcts.  No acute intracranial abnormality.  Atrophy,  chronic microvascular disease. __________________________________________  PROCEDURES  Procedure(s) performed: None  Critical Care performed: None  ____________________________________________   ED COURSE / ASSESSMENT AND PLAN  Pertinent labs & imaging results that were available during my care of the patient were reviewed by me and considered in my medical decision making (see chart for details).   Mr. Cieslik is here requesting evaluation for generalized weakness, and although in some ways he is a fairly good historian, in other ways he is fairly vague. Sounds like the onset is weeks, and he is not complaining of any focal neurologic  complaints. However, on exam it seems like his right upper extremity is weaker than his left upper extremity. When I ask him if prior strokes had affected in that way, he cannot recall. When I asked him if he thinks is having a new stroke as this arm is weak, he says no at all think it is new.  Overall well-appearing, will evaluate with laboratory studies and urinalysis. I am going to add head CT.   CT had reassuring. EKG nonspecific and troponin 0.03 at suspect this is not consistent with any acute coronary syndrome without any chest pain.  Urinalysis is positive for nitrite urinary tract infection. His white blood cell count is also slightly elevated. There's been no fever or hypotension or tachycardia and I did not place the patient on the sepsis protocol, however I did add blood cultures given the extreme weakness he is expressing.  He is from some sort of assisted living where he does not even have the energy to do the functions he needs to be able to. [facility] admission for nitrite or positive UTI with elevated white blood cell count, and systemic generalized weakness. He may need discharge to a higher level of care ultimately.    CONSULTATIONS:   Hospitalist for admission.   Patient / Family / Caregiver informed of clinical course,  medical decision-making process, and agree with plan.  ___________________________________________   FINAL CLINICAL IMPRESSION(S) / ED DIAGNOSES   Final diagnoses:  Generalized weakness  Urinary tract infection with hematuria, site unspecified              Note: This dictation was prepared with Dragon dictation. Any transcriptional errors that result from this process are unintentional    Lisa Roca, MD 01/30/17 1338

## 2017-01-30 NOTE — Clinical Social Work Note (Signed)
Clinical Social Work Assessment  Patient Details  Name: Matthew Bright MRN: [id number] Date of Birth: 09-19-1926  Date of referral:  01/30/17               Reason for consult:  Facility Placement                Permission sought to share information with:  Family Supports, Customer service manager Permission granted to share information::  Yes, Verbal Permission Granted  Name::        Agency::     Relationship::     Contact Information:   Jeral Fruit ( daughter) [phone] Pandora Leiter) [place] [phone]   Housing/Transportation Living arrangements for the past 2 months:  [facility] [place]) Source of Information:  Patient, Adult Children Patient Interpreter Needed:  None Criminal Activity/Legal Involvement Pertinent to Current Situation/Hospitalization:  No - Comment as needed Significant Relationships:  Adult Children Lives with:  Facility Resident Do you feel safe going back to the place where you live?  No Need for family participation in patient care:  Yes (Comment)  Care giving concerns:    Facilities manager / plan: LCSW introduced myself to patient and his daughter and with verbal consnet questions were asked to complete assessment. Patient is alert and oriented x4 with a great sense of humor, he has resided at [facility] for a while and has been to SNF 2x for PT. He presented today with urine incontinence issues and serious pain and extreme weakness. Patient was unable to life himself up or stand. He has been receiving PT- NO WEIGHT BEARING due to recent fractures at [facility]. Family has requested SNF as his needs are not being met at the ALF at this time. LCSW provided family with facility booklet and other resources Programme researcher, broadcasting/film/video). Patient is incontinent and unable to stand or walk. He was seen last week with Dr Percell Miller and [facility] in [place] and they do not want him to do weight bearing PT. Patient is on a normal diet and currently there is a  DNR. Patient has medicare part A and B and BCBS Supplement. LCSW will complete assessment and start Fl2/passr.  Employment status:  Retired Forensic scientist:   Medicare/BCBS PT Recommendations:   x5 weekly Information / Referral to community resources: SNF handout    Patient/Family's Response to care:  Higher level of care and weakness unable to stand or pull himself  Patient/Family's Understanding of and Emotional Response to Diagnosis, Current Treatment, and Prognosis: They understand he has been feeling well and extreme fatigue and weakness  Emotional Assessment Appearance:  good Attitude/Demeanor/Rapport:   Calm and polite Affect (typically observed):  Accepting, Adaptable, Calm Orientation:  Oriented to Self, Oriented to Place, Oriented to  Time, Oriented to Situation Alcohol / Substance use:  Not Applicable Psych involvement (Current and /or in the community):  No (Comment)  Discharge Needs  Concerns to be addressed:  Higher level of care Readmission within the last 30 days:  No Current discharge risk:  Dependent with Mobility Barriers to Discharge:  No Barriers Identified   [name], LCSW 01/30/2017, 4:08 PM

## 2017-01-30 NOTE — Progress Notes (Signed)
Family Meeting Note  Advance Directive:yes  Today a meeting took place with the Patient, daughter ,MD   The following clinical team members were present during this meeting:MD  The following were discussed:Patient's diagnosis: , Patient's progosis: > 12 months and diagnoses and plan of care discussed with the patient and daughter at bedside Goals for treatment: DNR both son and daughter healthcare power of attorney  Additional follow-up to be provided: Hospitalist  Time spent during discussion:18 MIN  Nicholes Mango, MD

## 2017-01-30 NOTE — NC FL2 (Signed)
[place] LEVEL OF CARE SCREENING TOOL     IDENTIFICATION  Patient Name: Matthew Bright Birthdate: [birth year] Sex: male Admission Date (Current Location): 01/30/2017  [place] and [place] Number:  Engineering geologist and Address:  [facility], [address] [place], [place] [address]      Provider Number: [id number]  Attending Physician Name and Address:  Nicholes Mango, MD  Relative Name and Phone Number:       Current Level of Care: Hospital Recommended Level of Care: [place] Prior Approval Number:    Date Approved/Denied:   PASRR Number:  [id number]   Discharge Plan: SNF    Current Diagnoses: Patient Active Problem List   Diagnosis Date Noted  . Acute cystitis 01/30/2017  . Age-related osteoporosis with current pathol fracture of right femur, with routine healing, subsequent encounter 12/19/2016  . Chronic adrenal insufficiency ([place]) 05/03/2016  . Age-related osteoporosis with current pathological fracture, vertebra(e), subsequent encounter for fracture with routine healing 05/03/2016  . Benign enlargement of prostate 05/03/2016  . Arteriosclerosis of coronary artery 05/03/2016  . Cerumen impaction 05/03/2016  . CFIDS (chronic fatigue and immune dysfunction syndrome) ([place]) 05/03/2016  . Cerebrovascular accident (CVA) ([place]) 05/03/2016  . Dizziness 05/03/2016  . Abnormal serum enzyme level 05/03/2016  . Fall 05/03/2016  . H/O adenomatous polyp of colon 05/03/2016  . BP (high blood pressure) 05/03/2016  . Arterial blood pressure decreased 05/03/2016  . HLD (hyperlipidemia) 05/03/2016  . Angulation of spine 05/03/2016  . Arthralgia of hip 05/03/2016  . Leg weakness 05/03/2016  . Spinal stenosis of lumbar region without neurogenic claudication 05/03/2016  . Impaired mobility 05/03/2016  . Muscle ache 05/03/2016  . Need for vaccination 05/03/2016  . Arthritis, degenerative 05/03/2016  . Osteopenia 05/03/2016   . Venous (peripheral) insufficiency 05/03/2016  . Encounter for screening for malignant neoplasm of prostate 05/03/2016  . Occlusion and stenosis of vertebral artery 05/03/2016  . Encounter for general adult medical examination without abnormal findings 05/03/2016  . Episode of syncope 05/03/2016  . Pes varus 05/03/2016  . Head revolving around 05/03/2016  . Avitaminosis D 05/03/2016  . Mitral regurgitation 04/17/2015  . TIA (transient ischemic attack) 07/08/2014  . Aortic valve disease 04/05/2014  . Asymptomatic varicose veins 08/16/2013  . Hyposmolality and/or hyponatremia 04/02/2013  . Syncope, vasovagal 12/08/2011  . Hypercholesterolemia 11/30/2011  . HTN (hypertension) 10/22/2011  . Abnormal EKG 10/22/2011    Orientation RESPIRATION BLADDER Height & Weight     Self, Time, Situation, Place  Normal Incontinent Weight: 150 lb 8 oz (68.3 kg) Height:     BEHAVIORAL SYMPTOMS/MOOD NEUROLOGICAL BOWEL NUTRITION STATUS      Incontinent Diet (Normal)  AMBULATORY STATUS COMMUNICATION OF NEEDS Skin   Extensive Assist Verbally Normal                       Personal Care Assistance Level of Assistance  Bathing, Feeding, Dressing, Total care Bathing Assistance: Limited assistance Feeding assistance: Limited assistance Dressing Assistance: Limited assistance Total Care Assistance: Limited assistance   Functional Limitations Info  Sight, Hearing, Speech Sight Info: Adequate Hearing Info: Adequate Speech Info: Adequate    SPECIAL CARE FACTORS FREQUENCY                       Contractures      Additional Factors Info  Code Status, Allergies Code Status Info: DNR Allergies Info: Avalide,Crestor,Darvocet,Lovstatin,propoxyphene,Zocar  Current Medications (01/30/2017):  This is the current hospital active medication list Current Facility-Administered Medications  Medication Dose Route Frequency Provider Last Rate Last Dose  . 0.9 %  sodium chloride  infusion   Intravenous Continuous Nicholes Mango, MD      . Derrill Memo ON 01/31/2017] cefTRIAXone (ROCEPHIN) 1 g in dextrose 5 % 50 mL IVPB  1 g Intravenous Q24H Nicholes Mango, MD      . Derrill Memo ON 01/31/2017] cholecalciferol (VITAMIN D) tablet 4,000 Units  4,000 Units Oral Daily Nicholes Mango, MD      . Derrill Memo ON 01/31/2017] clopidogrel (PLAVIX) tablet 75 mg  75 mg Oral Daily Dovey Fatzinger, MD      . docusate sodium (COLACE) capsule 100 mg  100 mg Oral BID Nicholes Mango, MD      . enoxaparin (LOVENOX) injection 40 mg  40 mg Subcutaneous Q24H Nicholes Mango, MD      . HYDROcodone-acetaminophen (NORCO/VICODIN) 5-325 MG per tablet 1 tablet  1 tablet Oral Q6H PRN Nicholes Mango, MD      . Derrill Memo ON 01/31/2017] multivitamin with minerals tablet 1 tablet  1 tablet Oral Daily Eulan Heyward, MD      . ondansetron (ZOFRAN) tablet 4 mg  4 mg Oral Q6H PRN Nicholes Mango, MD       Or  . ondansetron (ZOFRAN) injection 4 mg  4 mg Intravenous Q6H PRN Gessica Jawad, MD      . polyethylene glycol (MIRALAX / GLYCOLAX) packet 17 g  17 g Oral Daily PRN Nicholes Mango, MD      . Derrill Memo ON 01/31/2017] potassium chloride (K-DUR) CR tablet 10 mEq  10 mEq Oral Daily Shameka Aggarwal, MD      . tamsulosin (FLOMAX) capsule 0.4 mg  0.4 mg Oral Daily Nicholes Mango, MD         Discharge Medications: Please see discharge summary for a list of discharge medications.  Relevant Imaging Results:  Relevant Lab Results:   Additional Information SSN  [id number]  [name], [place]

## 2017-01-30 NOTE — ED Triage Notes (Signed)
Patient from [facility]. Requested transport for generalized weakness for 1 year. Patient also states that he urinated on himself 4 times last night.

## 2017-01-31 DIAGNOSIS — L8990 Pressure ulcer of unspecified site, unspecified stage: Secondary | ICD-10-CM | POA: Insufficient documentation

## 2017-01-31 LAB — COMPREHENSIVE METABOLIC PANEL
ALBUMIN: 3 g/dL — AB (ref 3.5–5.0)
ALK PHOS: 89 U/L (ref 38–126)
ALT: 15 U/L — ABNORMAL LOW (ref 17–63)
ANION GAP: 6 (ref 5–15)
AST: 24 U/L (ref 15–41)
BUN: 22 mg/dL — ABNORMAL HIGH (ref 6–20)
CALCIUM: 8 mg/dL — AB (ref 8.9–10.3)
CO2: 26 mmol/L (ref 22–32)
Chloride: 104 mmol/L (ref 101–111)
Creatinine, Ser: 0.78 mg/dL (ref 0.61–1.24)
GFR calc non Af Amer: >60 mL/min (ref >60)
GLUCOSE: 84 mg/dL (ref 65–99)
POTASSIUM: 3.2 mmol/L — AB (ref 3.5–5.1)
SODIUM: 136 mmol/L (ref 135–145)
TOTAL PROTEIN: 5.5 g/dL — AB (ref 6.5–8.1)
Total Bilirubin: 0.9 mg/dL (ref 0.3–1.2)

## 2017-01-31 LAB — CBC
HEMATOCRIT: 34.1 % — AB (ref 40.0–52.0)
HEMOGLOBIN: 11.8 g/dL — AB (ref 13.0–18.0)
MCH: 31.9 pg (ref 26.0–34.0)
MCHC: 34.8 g/dL (ref 32.0–36.0)
MCV: 91.9 fL (ref 80.0–100.0)
Platelets: 188 10*3/uL (ref 150–440)
RBC: 3.71 MIL/uL — AB (ref 4.40–5.90)
RDW: 14.3 % (ref 11.5–14.5)
WBC: 21.6 10*3/uL — ABNORMAL HIGH (ref 3.8–10.6)

## 2017-01-31 MED ORDER — SODIUM CHLORIDE 0.9 % IV SOLN
INTRAVENOUS | Status: AC
  Administered 2017-01-31: 20:00:00 via INTRAVENOUS

## 2017-01-31 MED ORDER — POTASSIUM CHLORIDE CRYS ER 20 MEQ PO TBCR
40.0000 meq | EXTENDED_RELEASE_TABLET | Freq: Once | ORAL | Status: AC
  Administered 2017-01-31: 40 meq via ORAL
  Filled 2017-01-31: qty 2

## 2017-01-31 MED ORDER — ACETAMINOPHEN 325 MG PO TABS: 650.0000 mg | ORAL_TABLET | Freq: Four times a day (QID) | ORAL | Status: DC | PRN

## 2017-01-31 MED ORDER — TERAZOSIN HCL 1 MG PO CAPS
1.0000 mg | ORAL_CAPSULE | Freq: Every day | ORAL | Status: DC
  Administered 2017-01-31 – 2017-02-01 (×2): 1 mg via ORAL
  Filled 2017-01-31 (×3): qty 1

## 2017-01-31 NOTE — Progress Notes (Signed)
Patient unable to void. Bladder scan 700 mL. MD notified, order to in & out cath. 681mL in & out.

## 2017-01-31 NOTE — Progress Notes (Signed)
[place] [facility] [place] NAME: Matthew Bright    MR#:  [id number]  DATE OF BIRTH:  Jul 05, 1926  SUBJECTIVE:  CHIEF COMPLAINT:   Chief Complaint  Patient presents with  . Weakness   - From assisted living facility. Complains of significant weakness. -On fluids and antibiotics. Urine cultures are pending  REVIEW OF SYSTEMS:  Review of Systems  Constitutional: Negative for chills and fever.  HENT: Negative for congestion, ear discharge, hearing loss and nosebleeds.   Eyes: Negative for blurred vision and double vision.  Respiratory: Negative for cough, shortness of breath and wheezing.   Cardiovascular: Negative for chest pain, palpitations and leg swelling.  Gastrointestinal: Negative for abdominal pain, constipation, diarrhea, nausea and vomiting.  Genitourinary: Negative for dysuria and urgency.  Musculoskeletal: Negative for myalgias.  Neurological: Positive for weakness. Negative for dizziness, speech change, focal weakness, seizures and headaches.  Psychiatric/Behavioral: Negative for depression.    DRUG ALLERGIES:   Allergies  Allergen Reactions  . Avalide [Irbesartan-Hydrochlorothiazide] Other (See Comments)    Unknown  . Crestor [Rosuvastatin] Other (See Comments)    Muscle weakness in legs Reaction Unknown  . Darvocet [Propoxyphene N-Acetaminophen] Other (See Comments)    Unknown  . Lovastatin     Other reaction(s): Muscle Pain Weakness in legs Unknown  . Propoxyphene   . Zocor [Simvastatin]     Other reaction(s): Muscle Pain Weakness in legs Unknown    VITALS:  Blood pressure (!) 110/46, pulse 77, temperature 98.1 F (36.7 C), resp. rate 16, height 5\' 8"  (1.727 m), weight 68.3 kg (150 lb 8 oz), SpO2 99 %.  PHYSICAL EXAMINATION:  Physical Exam  GENERAL:  83 y.o.-year-old elderly patient lying in the bed with no acute distress.  EYES: Pupils equal, round, reactive to light and accommodation. No scleral icterus.  Extraocular muscles intact.  HEENT: Head atraumatic, normocephalic. Oropharynx and nasopharynx clear.  NECK:  Supple, no jugular venous distention. No thyroid enlargement, no tenderness.  LUNGS: Normal breath sounds bilaterally, no wheezing, rales,rhonchi or crepitation. No use of accessory muscles of respiration. Decreased bibasilar breath sounds CARDIOVASCULAR: S1, S2 normal. No  rubs, or gallops. 3/6 systolic murmur is present ABDOMEN: Soft, nontender, nondistended. Bowel sounds present. No organomegaly or mass.  EXTREMITIES: No pedal edema, cyanosis, or clubbing.  NEUROLOGIC: Cranial nerves II through XII are intact. Muscle strength 5/5 in both upper extremities. Strength is 3/5 in both lower extremities. Sensation intact. Gait not checked.  PSYCHIATRIC: The patient is alert and oriented x 3.  SKIN: No obvious rash, lesion, or ulcer.    LABORATORY PANEL:   CBC  Recent Labs Lab 01/31/17 0359  WBC 21.6*  HGB 11.8*  HCT 34.1*  PLT 188   ------------------------------------------------------------------------------------------------------------------  Chemistries   Recent Labs Lab 01/31/17 0359  NA 136  K 3.2*  CL 104  CO2 26  GLUCOSE 84  BUN 22*  CREATININE 0.78  CALCIUM 8.0*  AST 24  ALT 15*  ALKPHOS 89  BILITOT 0.9   ------------------------------------------------------------------------------------------------------------------  Cardiac Enzymes  Recent Labs Lab 01/30/17 0940  TROPONINI 0.03*   ------------------------------------------------------------------------------------------------------------------  RADIOLOGY:  Ct Head Wo Contrast  Result Date: 01/30/2017 CLINICAL DATA:  Worsening right arm weakness. EXAM: CT HEAD WITHOUT CONTRAST TECHNIQUE: Contiguous axial images were obtained from the base of the skull through the vertex without intravenous contrast. COMPARISON:  11/27/2016 FINDINGS: Brain: Old left occipital infarct with encephalomalacia. Old  bilateral cerebellar infarcts. There is atrophy and chronic small vessel disease changes.  No acute intracranial abnormality. Specifically, no hemorrhage, hydrocephalus, mass lesion, acute infarction, or significant intracranial injury. Vascular: No hyperdense vessel or unexpected calcification. Skull: No acute calvarial abnormality. Sinuses/Orbits: Complete opacification of the left frontal sinus and ethmoid air cells. Mucosal thickening in the right ethmoid air cells and left maxillary sinus. Findings stable. Mastoids are clear. Other: None IMPRESSION: Old left occipital and bilateral cerebellar infarcts. No acute intracranial abnormality. Atrophy, chronic microvascular disease. Electronically Signed   By: Rolm Baptise M.D.   On: 01/30/2017 10:15    EKG:   Orders placed or performed during the hospital encounter of 01/30/17  . ED EKG  . ED EKG    ASSESSMENT AND PLAN:   83 year old male with past medical history significant for hypertension, hyperlipidemia, coronary artery disease, arthritis, recent right hip fracture, history of CVA with poor vision presents from assisted living facility due to extreme weakness and noted to have UTI.  #1 acute cystitis-follow-up urine cultures. -Continue Rocephin at this time. Also blood cultures are pending. -Wbc's much elevated today. Monitor. No fevers.  #2 generalized weakness-secondary to about. Also prior generator disc disease with neck pain and left-sided weakness. -Physical therapy consult and possible rehabilitation at discharge.  #3 hypokalemia-being replaced appropriately  #4 BPH-Flomax  #5 recent hip and pelvis injury-weight bearing as tolerated, however patient following with [facility] orthopedics and according to daughter, patient has been in wheelchair with walker transfers to bedside commode. Nonweightbearing both legs. -Follow up with orthopedics as prior recommended  #6 DVT prophylaxis-on Lovenox   Updated daughter over the  phone. PT consulted.   All the records are reviewed and case discussed with Care Management/Social Workerr. Management plans discussed with the patient, family and they are in agreement.  CODE STATUS: Full code  TOTAL TIME TAKING CARE OF THIS PATIENT: 37 minutes.   POSSIBLE D/C IN 1-2 DAYS, DEPENDING ON CLINICAL CONDITION.   Gladstone Lighter M.D on 01/31/2017 at 2:30 PM  Between 7am to 6pm - Pager - [phone]  After 6pm go to [email or web address] - password [email or web address] [place] [facility]  Office  [phone]  CC: Primary care physician; PROVIDER NOT IN SYSTEM

## 2017-01-31 NOTE — Evaluation (Signed)
Physical Therapy Evaluation Patient Details Name: Matthew Bright MRN: [id number] DOB: [birth year] Today's Date: 01/31/2017   History of Present Illness  Pt is an 83 y.o. male presenting to hospital with generalized weakness.  Pt admitted with acute cystitis.  Of note, pt with h/o multiple falls and imaging 11/04/16 showing L2 compression fracture; imaging 11/27/16 showing suspected acute L1 compression fx; fx along lateral aspect of R proximal femur just below the trochanteric region; also possible fx along R side of superior aspect of symphysis.  Per MD Tressia Miners, pt's daughter reports pt is [name] LE's (per ortho) but does stand and WB to go to chair with walker.  PMH includes cerebellar stroke (impaired vision), htn, vertigo, s/p ACDF C5-6, B THA, BPH.  Clinical Impression  Prior to hospital admission, pt reports he was sleeping in lift chair and transferring to w/c with RW by himself and also propelling w/c by himself but has required significant assist this past week d/t increasing R>L UE weakness (pt tangential and not specific with answers during session limiting clarity and accuracy of information received; attempted to call pt's daughter to clarify information but unable to reach her).  Currently pt is mod assist supine to sit and 2 assist with sit to supine.  Pt demonstrating significant R UE weakness (compared to L UE weakness) limiting mobility (especially with reports of [place] B LE status).  Pt would benefit from skilled PT to address noted impairments and functional limitations.  Recommend pt discharge to STR when medically appropriate.  Also recommend OT consult.    Follow Up Recommendations SNF    Equipment Recommendations   (pt has RW and w/c already)    Recommendations for Other Services OT consult     Precautions / Restrictions Precautions Precautions: Fall;Back Precaution Comments: H/o L1 and L2 compression fractures Restrictions Weight Bearing Restrictions: Yes RLE  Weight Bearing: Non weight bearing LLE Weight Bearing: Non weight bearing Other Position/Activity Restrictions: Per MD Tressia Miners, pt's daughter reports pt is [name] LE's (per recent ortho visit) but pt does stand and WB to go to chair with walker (transfers only).      Mobility  Bed Mobility Overal bed mobility: Needs Assistance Bed Mobility: Supine to Sit;Sit to Supine     Supine to sit: Mod assist;HOB elevated     General bed mobility comments: pt able to move B LE out of bed but required mod assist for trunk to sit on edge of bed; pt requiring assist for B LE's and trunk sit to supine; vc's for technique required  Transfers                 General transfer comment: Deferred d/t LE NWB'ing status and pt's significant R>L UE weakness.  Ambulation/Gait             General Gait Details: Pt currently non-ambulatory d/t NWB'ing status  Stairs            Wheelchair Mobility    Modified Rankin (Stroke Patients Only)       Balance Overall balance assessment: Needs assistance Sitting-balance support: Bilateral upper extremity supported;Feet supported Sitting balance-Leahy Scale: Poor Sitting balance - Comments: intermittent posterior lean requiring vc's to shift weight forward                                     Pertinent Vitals/Pain Pain Assessment: No/denies pain  Vitals (HR and O2 on  room air) stable and WFL throughout treatment session.    Home Living Family/patient expects to be discharged to:: Assisted living               Home Equipment: Wheelchair - manual;Walker - 2 wheels      Prior Function Level of Independence: Independent with assistive device(s)         Comments: Pt reports since discharge from STR, he has been able to transfer by himself but has gotten weaker in last week and now unable.  Pt uses lift chair to sleep in and transfers to w/c (was able to propel self in w/c until the last week).  Pt reports he  used to wear a brace on his R foot (d/t rotating in after hip replacement) but no longer uses it.     Hand Dominance        Extremity/Trunk Assessment   Upper Extremity Assessment Upper Extremity Assessment: RUE deficits/detail;LUE deficits/detail RUE Deficits / Details: R shoulder flexion 1/5; R elbow flexion 1/5; R elbow extension 2/5; weak R hand grip; increased tone noted R elbow (resistance into flexion) LUE Deficits / Details: L shoulder flexion 2+/5; L elbow flexion 3+/5; L elbow extension 3+/5; weak L hand grip (but stronger than R hand grip)    Lower Extremity Assessment Lower Extremity Assessment: RLE deficits/detail;LLE deficits/detail RLE Deficits / Details: hip flexion 2/5; knee extension at least 3/5; knee flexion at least 3/5; DF at least 2+/5; R foot inverted and plantarflexed at rest (pt reports having this for a long time); increased tone noted R LE LLE Deficits / Details: hip flexion at least 3+/5; knee flexion/extension at least 3+/5; DF at least 3+/5  B DF to neutral     Communication   Communication: No difficulties  Cognition Arousal/Alertness: Awake/alert Behavior During Therapy:  (Verbose; tangential) Overall Cognitive Status: Within Functional Limits for tasks assessed                      General Comments   Nursing cleared pt for participation in physical therapy.  Pt agreeable to PT session.    Exercises General Exercises - Lower Extremity Ankle Circles/Pumps: AROM;Strengthening;Both;10 reps;Supine Quad Sets: AROM;Strengthening;Both;10 reps;Supine Gluteal Sets: AROM;Strengthening;Both;10 reps;Supine Short Arc Quad: AROM;Strengthening;Both;10 reps;Supine Heel Slides: Strengthening;Both;10 reps;Supine (AAROM R; AROM L) Hip ABduction/ADduction: Strengthening;Left;10 reps;Supine;AROM  Vc's required for above ex technique.   Assessment/Plan    PT Assessment Patient needs continued PT services  PT Problem List Decreased strength;Decreased  activity tolerance;Decreased balance;Decreased mobility          PT Treatment Interventions DME instruction;Functional mobility training;Therapeutic activities;Therapeutic exercise;Balance training;Patient/family education;Wheelchair mobility training    PT Goals (Current goals can be found in the Care Plan section)  Acute Rehab PT Goals Patient Stated Goal: to be able to transfer again by himself PT Goal Formulation: With patient Time For Goal Achievement: 02/14/17 Potential to Achieve Goals: Fair    Frequency Min 2X/week   Barriers to discharge Decreased caregiver support      Co-evaluation               End of Session   Activity Tolerance: Patient tolerated treatment well Patient left: in bed;with call bell/phone within reach;with bed alarm set (B heels elevated via pillow) Nurse Communication: Mobility status;Precautions;Weight bearing status         Time: [id number] PT Time Calculation (min) (ACUTE ONLY): 32 min   Charges:   PT Evaluation $PT Eval Low Complexity: 1 Procedure PT Treatments $Therapeutic  Exercise: 8-22 mins   PT G CodesLeitha Bleak 03-Feb-2017, 5:20 PM Leitha Bleak, [place]

## 2017-01-31 NOTE — Progress Notes (Addendum)
Clinical Social Worker (CSW) contacted patient's daughter Jeral Fruit and made her aware that PT is recommending SNF and provided bed offers. Per Jody patient was at [facility] recently for 60 days under private pay because he did not have a 3 night inpatient stay. Patient was admitted to inpatient on 01/30/17 and CSW explained that medicare will cover SNF this time, if he stays for 3 nights. Per Jody she will discuss offers with her brother and call CSW back with a choice. CSW will continue to follow and assist as needed.   Patient's son Ronalee Belts called CSW and chose [facility]. [place] admissions coordinator at [facility] is aware of accepted bed offer.   [employer], LCSW [phone]

## 2017-01-31 NOTE — Progress Notes (Signed)
Pt has not voided today, after being bladder scan twice Q 6H (see flowsheet), bladder scan volume is 202 ml. Prime doc paged and notified. MD Posey Pronto placing orders.

## 2017-01-31 NOTE — Clinical Social Work Placement (Signed)
   CLINICAL SOCIAL WORK PLACEMENT  NOTE  Date:  01/31/2017  Patient Details  Name: Matthew Bright MRN: [id number] Date of Birth: [birth year]  Clinical Social Work is seeking post-discharge placement for this patient at the [place] level of care (*CSW will initial, date and re-position this form in  chart as items are completed):  Yes   Patient/family provided with [facility] Work Department's list of facilities offering this level of care within the geographic area requested by the patient (or if unable, by the patient's family).  Yes   Patient/family informed of their freedom to choose among providers that offer the needed level of care, that participate in Medicare, Medicaid or managed care program needed by the patient, have an available bed and are willing to accept the patient.  Yes   Patient/family informed of [facility]'s ownership interest in [facility] and [facility], as well as of the fact that they are under no obligation to receive care at these facilities.  PASRR submitted to EDS on       PASRR number received on       Existing PASRR number confirmed on 01/30/17     FL2 transmitted to all facilities in geographic area requested by pt/family on 01/30/17     FL2 transmitted to all facilities within larger geographic area on       Patient informed that his/her managed care company has contracts with or will negotiate with certain facilities, including the following:        Yes   Patient/family informed of bed offers received.  Patient chooses bed at       Physician recommends and patient chooses bed at      Patient to be transferred to   on  .  Patient to be transferred to facility by       Patient family notified on   of transfer.  Name of family member notified:        PHYSICIAN       Additional Comment:    _______________________________________________ Gaberiel Youngblood, Veronia Beets, LCSW 01/31/2017, 11:59 AM

## 2017-02-01 ENCOUNTER — Encounter: Payer: Self-pay | Admitting: Student

## 2017-02-01 LAB — CBC
HCT: 31.7 % — ABNORMAL LOW (ref 40.0–52.0)
Hemoglobin: 10.7 g/dL — ABNORMAL LOW (ref 13.0–18.0)
MCH: 31 pg (ref 26.0–34.0)
MCHC: 33.7 g/dL (ref 32.0–36.0)
MCV: 92.2 fL (ref 80.0–100.0)
PLATELETS: 163 10*3/uL (ref 150–440)
RBC: 3.44 MIL/uL — ABNORMAL LOW (ref 4.40–5.90)
RDW: 14.5 % (ref 11.5–14.5)
WBC: 14 10*3/uL — AB (ref 3.8–10.6)

## 2017-02-01 LAB — BASIC METABOLIC PANEL
ANION GAP: 4 — AB (ref 5–15)
BUN: 26 mg/dL — ABNORMAL HIGH (ref 6–20)
CO2: 25 mmol/L (ref 22–32)
CREATININE: 0.79 mg/dL (ref 0.61–1.24)
Calcium: 8.1 mg/dL — ABNORMAL LOW (ref 8.9–10.3)
Chloride: 104 mmol/L (ref 101–111)
Glucose, Bld: 92 mg/dL (ref 65–99)
Potassium: 3.7 mmol/L (ref 3.5–5.1)
SODIUM: 133 mmol/L — AB (ref 135–145)

## 2017-02-01 MED ORDER — CEFDINIR 125 MG/5ML PO SUSR: 300.0000 mg | Freq: Two times a day (BID) | ORAL | Status: DC

## 2017-02-01 MED ORDER — TAMSULOSIN HCL 0.4 MG PO CAPS: 0.4000 mg | ORAL_CAPSULE | Freq: Every day | ORAL | 2 refills | Status: AC

## 2017-02-01 MED ORDER — DOCUSATE SODIUM 100 MG PO CAPS: 100.0000 mg | ORAL_CAPSULE | Freq: Two times a day (BID) | ORAL | 0 refills | Status: AC

## 2017-02-01 MED ORDER — CEFDINIR 300 MG PO CAPS
300.0000 mg | ORAL_CAPSULE | Freq: Two times a day (BID) | ORAL | Status: DC
  Administered 2017-02-01 – 2017-02-02 (×2): 300 mg via ORAL
  Filled 2017-02-01 (×3): qty 1

## 2017-02-01 MED ORDER — CEFDINIR 300 MG PO CAPS: 300.0000 mg | ORAL_CAPSULE | Freq: Two times a day (BID) | ORAL | 0 refills | Status: DC

## 2017-02-01 MED ORDER — HYDROCODONE-ACETAMINOPHEN 5-325 MG PO TABS: 1.0000 | ORAL_TABLET | Freq: Four times a day (QID) | ORAL | 0 refills | Status: AC | PRN

## 2017-02-01 NOTE — Care Management Important Message (Signed)
Important Message  Patient Details  Name: Matthew Bright MRN: [id number] Date of Birth: 1926/11/04   Medicare Important Message Given:  Yes    Jolly Mango, RN 02/01/2017, 10:42 AM

## 2017-02-01 NOTE — Progress Notes (Signed)
Patient voided on brief.

## 2017-02-01 NOTE — Progress Notes (Signed)
[place] at [place] NAME: Matthew Bright    MR#:  [id number]  DATE OF BIRTH:  1926-11-06  SUBJECTIVE:  CHIEF COMPLAINT:   Chief Complaint  Patient presents with  . Weakness   - From assisted living facility. Complains of significant weakness. -On fluids and antibiotics. Urine cultures are pending  REVIEW OF SYSTEMS:  Review of Systems  Constitutional: Negative for chills and fever.  HENT: Negative for congestion, ear discharge, hearing loss and nosebleeds.   Eyes: Negative for blurred vision and double vision.  Respiratory: Negative for cough, shortness of breath and wheezing.   Cardiovascular: Negative for chest pain, palpitations and leg swelling.  Gastrointestinal: Negative for abdominal pain, constipation, diarrhea, nausea and vomiting.  Genitourinary: Negative for dysuria and urgency.  Musculoskeletal: Negative for myalgias.  Neurological: Positive for focal weakness and weakness. Negative for dizziness, speech change, seizures and headaches.  Psychiatric/Behavioral: Negative for depression.    DRUG ALLERGIES:   Allergies  Allergen Reactions  . Avalide [Irbesartan-Hydrochlorothiazide] Other (See Comments)    Unknown  . Crestor [Rosuvastatin] Other (See Comments)    Muscle weakness in legs Reaction Unknown  . Darvocet [Propoxyphene N-Acetaminophen] Other (See Comments)    Unknown  . Lovastatin     Other reaction(s): Muscle Pain Weakness in legs Unknown  . Propoxyphene   . Zocor [Simvastatin]     Other reaction(s): Muscle Pain Weakness in legs Unknown    VITALS:  Blood pressure (!) 153/69, pulse 72, temperature 97.6 F (36.4 C), resp. rate 18, height 5\' 8"  (1.727 m), weight 68.3 kg (150 lb 8 oz), SpO2 97 %.  PHYSICAL EXAMINATION:  Physical Exam  GENERAL: 83 y.o.-year-old elderlypatient lying in the bed with no acute distress.  EYES: Pupils equal, round, reactive to light and accommodation. No scleral icterus.  Extraocular muscles intact.  HEENT: Head atraumatic, normocephalic. Oropharynx and nasopharynx clear.  NECK: Supple, no jugular venous distention. No thyroid enlargement, no tenderness.  LUNGS: Normal breath sounds bilaterally, no wheezing, rales,rhonchi or crepitation. No use of accessory muscles of respiration. Decreased bibasilar breath sounds CARDIOVASCULAR: S1, S2 normal. No rubs, or gallops. 3/6 systolic murmur is present ABDOMEN: Soft, nontender, nondistended. Bowel sounds present. No organomegaly or mass.  EXTREMITIES: No pedal edema, cyanosis, or clubbing.  NEUROLOGIC: Cranial nerves II through XII are intact. Muscle strength 5/5 in LUE, RUE is 3/5- weaker for almost an year. Strength is 3/5 in both lower extremities. Sensation intact. Gait not checked.  PSYCHIATRIC: The patient is alert and oriented x 3.  SKIN: No obvious rash, lesion, or ulcer.   LABORATORY PANEL:   CBC  Recent Labs Lab 02/01/17 0511  WBC 14.0*  HGB 10.7*  HCT 31.7*  PLT 163   ------------------------------------------------------------------------------------------------------------------  Chemistries   Recent Labs Lab 01/31/17 0359 02/01/17 0511  NA 136 133*  K 3.2* 3.7  CL 104 104  CO2 26 25  GLUCOSE 84 92  BUN 22* 26*  CREATININE 0.78 0.79  CALCIUM 8.0* 8.1*  AST 24  --   ALT 15*  --   ALKPHOS 89  --   BILITOT 0.9  --    ------------------------------------------------------------------------------------------------------------------  Cardiac Enzymes  Recent Labs Lab 01/30/17 0940  TROPONINI 0.03*   ------------------------------------------------------------------------------------------------------------------  RADIOLOGY:  No results found.  EKG:   Orders placed or performed during the hospital encounter of 01/30/17  . ED EKG  . ED EKG    ASSESSMENT AND PLAN:   83 year old male with past medical history  significant for hypertension, hyperlipidemia, coronary artery  disease, arthritis, recent right hip fracture, history of CVA with poor vision presents from assisted living facility due to extreme weakness and noted to have UTI.  #1 acute cystitis- pending urine cultures. -receiving Rocephin here, blood cultures are negative - wbc improving  -No fevers.   #2 generalized weakness-secondary to infection and worsening arthritis.  -Physical therapy consulted and rehabilitation at discharge.  #3 hypokalemia-replaced appropriately Chronically on potassium supplements  #4 BPH-Flomax  #5 recent hip and pelvis injury-weight bearing as tolerated, however patient following with [facility] orthopedics and according to daughter, patient has been in wheelchair with walker transfers to bedside commode. Nonweightbearing both legs. -Follow up with orthopedics as prior recommended  #6 DVT prophylaxis- Lovenox   All the records are reviewed and case discussed with Care Management/Social Workerr. Management plans discussed with the patient, family and they are in agreement.  CODE STATUS: DNR  TOTAL TIME TAKING CARE OF THIS PATIENT: 37 minutes.   POSSIBLE D/C TOMORROW, DEPENDING ON CLINICAL CONDITION.   Gladstone Lighter M.D on 02/01/2017 at 10:50 AM  Between 7am to 6pm - Pager - [phone]  After 6pm go to [email or web address] - password [email or web address] [facility]  Office  [phone]  CC: Primary care physician; PROVIDER NOT IN SYSTEM

## 2017-02-01 NOTE — Progress Notes (Signed)
Plan is for patient to D/C to [facility] tomorrow if medically stable. [facility] admissions coordinator at [facility] is aware of above. Patient's daughter Jeral Fruit is aware of above.   [employer], LCSW [phone]

## 2017-02-01 NOTE — Progress Notes (Signed)
Patient voided

## 2017-02-01 NOTE — Progress Notes (Signed)
Pt voided this shift. Incontinent of urine. Denied pain.

## 2017-02-01 NOTE — Discharge Summary (Signed)
[facility] at [employer] NAME: Matthew Bright    MR#:  [id number]  DATE OF BIRTH:  1926-05-15  DATE OF ADMISSION:  01/30/2017   ADMITTING PHYSICIAN: Nicholes Mango, MD  DATE OF DISCHARGE: 02/01/2017  PRIMARY CARE PHYSICIAN: PROVIDER NOT IN SYSTEM   ADMISSION DIAGNOSIS:   Generalized weakness [R53.1] Urinary tract infection with hematuria, site unspecified [N39.0, R31.9]  DISCHARGE DIAGNOSIS:   Active Problems:   Acute cystitis   Pressure injury of skin   SECONDARY DIAGNOSIS:   Past Medical History:  Diagnosis Date  . Allergy   . Carotid artery disease ([place])    per CTA but no significant stenosis  . Cerebellar stroke, acute ([place]) 2008   denies residual on 07/08/2014  . Enlarged prostate   . Exertional dyspnea   . Hernia 2006  . Hyperlipidemia   . Hypertension   . OA (osteoarthritis)    "~ qwhere you can have it"  . Prostate cancer [facility])    pt denies this hx on 07/08/2014  . Special screening for malignant neoplasms, colon   . Syncope and collapse  2008; October 2012  . Varicose veins   . Vertigo 2008    HOSPITAL COURSE:   83 year old male with past medical history significant for hypertension, hyperlipidemia, coronary artery disease, arthritis, recent right hip fracture, history of CVA with poor vision presents from assisted living facility due to extreme weakness and noted to have UTI.  #1 acute cystitis- pending urine cultures. -received Rocephin here, blood cultures are negative - wbc improving today -No fevers. Discharge on cefnidir  #2 generalized weakness-secondary to infection and worsening arthritis.  -Physical therapy consulted and rehabilitation at discharge.  #3 hypokalemia-replaced appropriately Chronically on potassium supplements  #4 BPH-Flomax  #5 recent hip and pelvis injury-weight bearing as tolerated, however patient following with [facility] orthopedics and according to daughter, patient has  been in wheelchair with walker transfers to bedside commode. Nonweightbearing both legs. -Follow up with orthopedics as prior recommended   Discharge today to rehab   DISCHARGE CONDITIONS:   Guarded  CONSULTS OBTAINED:    none  DRUG ALLERGIES:   Allergies  Allergen Reactions  . Avalide [Irbesartan-Hydrochlorothiazide] Other (See Comments)    Unknown  . Crestor [Rosuvastatin] Other (See Comments)    Muscle weakness in legs Reaction Unknown  . Darvocet [Propoxyphene N-Acetaminophen] Other (See Comments)    Unknown  . Lovastatin     Other reaction(s): Muscle Pain Weakness in legs Unknown  . Propoxyphene   . Zocor [Simvastatin]     Other reaction(s): Muscle Pain Weakness in legs Unknown   DISCHARGE MEDICATIONS:   Allergies as of 02/01/2017      Reactions   Avalide [irbesartan-hydrochlorothiazide] Other (See Comments)   Unknown   Crestor [rosuvastatin] Other (See Comments)   Muscle weakness in legs Reaction Unknown   Darvocet [propoxyphene N-acetaminophen] Other (See Comments)   Unknown   Lovastatin    Other reaction(s): Muscle Pain Weakness in legs Unknown   Propoxyphene    Zocor [simvastatin]    Other reaction(s): Muscle Pain Weakness in legs Unknown      Medication List    TAKE these medications   cefdinir 300 MG capsule Commonly known as:  OMNICEF Take 1 capsule (300 mg total) by mouth 2 (two) times daily. X 4 more days   CENTRUM PO Take 1 tablet by mouth daily.   Cholecalciferol 2000 units Caps Take 4,000 Units by mouth daily.   clopidogrel 75  MG tablet Commonly known as:  PLAVIX Take 75 mg by mouth daily.   COQ10 PO Take 1 capsule by mouth daily.   docusate sodium 100 MG capsule Commonly known as:  COLACE Take 1 capsule (100 mg total) by mouth 2 (two) times daily.   fludrocortisone 0.1 MG tablet Commonly known as:  FLORINEF Take 0.1 mg by mouth daily.   HYDROcodone-acetaminophen 5-325 MG tablet Commonly known as:   NORCO/VICODIN Take 1 tablet by mouth every 6 (six) hours as needed for moderate pain.   MINERIN Crea Apply 1 application topically as needed. Apply liberal amount topically to dry skin as needed.   polyethylene glycol packet Commonly known as:  MIRALAX / GLYCOLAX Take 17 g by mouth daily as needed.   potassium chloride 10 MEQ tablet Commonly known as:  K-DUR Take 1 tablet (10 mEq total) by mouth daily.   tamsulosin 0.4 MG Caps capsule Commonly known as:  FLOMAX Take 1 capsule (0.4 mg total) by mouth daily.        DISCHARGE INSTRUCTIONS:   1. Physical therapy 2. PCP follow-up in 1 week 3. Please do bladder scan if not voiding in 6-8 hrs from last void- if >355ml- please do an in and out cath. If had to be In and out catheterized for > 3 times- place a foley catheter and urology consult in 1 week  DIET:   Cardiac diet  ACTIVITY:   Activity as tolerated  OXYGEN:   Home Oxygen: No Oxygen Delivery: room air  DISCHARGE LOCATION:   nursing home   If you experience worsening of your admission symptoms, develop shortness of breath, life threatening emergency, suicidal or homicidal thoughts you must seek medical attention immediately by calling 911 or calling your MD immediately  if symptoms less severe.  You Must read complete instructions/literature along with all the possible adverse reactions/side effects for all the Medicines you take and that have been prescribed to you. Take any new Medicines after you have completely understood and accpet all the possible adverse reactions/side effects.   Please note  You were cared for by a hospitalist during your hospital stay. If you have any questions about your discharge medications or the care you received while you were in the hospital after you are discharged, you can call the unit and asked to speak with the hospitalist on call if the hospitalist that took care of you is not available. Once you are discharged, your primary  care physician will handle any further medical issues. Please note that NO REFILLS for any discharge medications will be authorized once you are discharged, as it is imperative that you return to your primary care physician (or establish a relationship with a primary care physician if you do not have one) for your aftercare needs so that they can reassess your need for medications and monitor your lab values.    On the day of Discharge:  VITAL SIGNS:   Blood pressure (!) 153/69, pulse 72, temperature 97.6 F (36.4 C), resp. rate 18, height 5\' 8"  (1.727 m), weight 68.3 kg (150 lb 8 oz), SpO2 97 %.  PHYSICAL EXAMINATION:   GENERAL:  83 y.o.-year-old elderly patient lying in the bed with no acute distress.  EYES: Pupils equal, round, reactive to light and accommodation. No scleral icterus. Extraocular muscles intact.  HEENT: Head atraumatic, normocephalic. Oropharynx and nasopharynx clear.  NECK:  Supple, no jugular venous distention. No thyroid enlargement, no tenderness.  LUNGS: Normal breath sounds bilaterally, no wheezing, rales,rhonchi  or crepitation. No use of accessory muscles of respiration. Decreased bibasilar breath sounds CARDIOVASCULAR: S1, S2 normal. No  rubs, or gallops. 3/6 systolic murmur is present ABDOMEN: Soft, nontender, nondistended. Bowel sounds present. No organomegaly or mass.  EXTREMITIES: No pedal edema, cyanosis, or clubbing.  NEUROLOGIC: Cranial nerves II through XII are intact. Muscle strength 5/5 in LUE, RUE is 3/5- weaker for almost an year. Strength is 3/5 in both lower extremities. Sensation intact. Gait not checked.  PSYCHIATRIC: The patient is alert and oriented x 3.  SKIN: No obvious rash, lesion, or ulcer.   DATA REVIEW:   CBC  Recent Labs Lab 02/01/17 0511  WBC 14.0*  HGB 10.7*  HCT 31.7*  PLT 163    Chemistries   Recent Labs Lab 01/31/17 0359 02/01/17 0511  NA 136 133*  K 3.2* 3.7  CL 104 104  CO2 26 25  GLUCOSE 84 92  BUN 22* 26*   CREATININE 0.78 0.79  CALCIUM 8.0* 8.1*  AST 24  --   ALT 15*  --   ALKPHOS 89  --   BILITOT 0.9  --      Microbiology Results  Results for orders placed or performed during the hospital encounter of 01/30/17  Urine culture     Status: Abnormal (Preliminary result)   Collection Time: 01/30/17  9:40 AM  Result Value Ref Range Status   Specimen Description URINE, RANDOM  Final   Special Requests NONE  Final   Culture >=100,000 COLONIES/mL GRAM NEGATIVE RODS (A)  Final   Report Status PENDING  Incomplete  Culture, blood (routine x 2)     Status: None (Preliminary result)   Collection Time: 01/30/17  1:45 PM  Result Value Ref Range Status   Specimen Description BLOOD  R ARM  Final   Special Requests BOTTLES DRAWN AEROBIC AND ANAEROBIC BCHV  Final   Culture NO GROWTH 2 DAYS  Final   Report Status PENDING  Incomplete  Culture, blood (routine x 2)     Status: None (Preliminary result)   Collection Time: 01/30/17  1:50 PM  Result Value Ref Range Status   Specimen Description BLOOD  L ARM  Final   Special Requests BOTTLES DRAWN AEROBIC AND ANAEROBIC  BCAV  Final   Culture NO GROWTH 2 DAYS  Final   Report Status PENDING  Incomplete  MRSA PCR Screening     Status: None   Collection Time: 01/30/17  6:55 PM  Result Value Ref Range Status   MRSA by PCR NEGATIVE NEGATIVE Final    Comment:        The GeneXpert MRSA Assay (FDA approved for NASAL specimens only), is one component of a comprehensive MRSA colonization surveillance program. It is not intended to diagnose MRSA infection nor to guide or monitor treatment for MRSA infections.     RADIOLOGY:  No results found.   Management plans discussed with the patient, family and they are in agreement.  CODE STATUS:     Code Status Orders        Start     Ordered   01/30/17 1631  Do not attempt resuscitation (DNR)  Continuous    Question Answer Comment  In the event of cardiac or respiratory ARREST Do not call a "code  blue"   In the event of cardiac or respiratory ARREST Do not perform Intubation, CPR, defibrillation or ACLS   In the event of cardiac or respiratory ARREST Use medication by any route, position, wound care, and  other measures to relive pain and suffering. May use oxygen, suction and manual treatment of airway obstruction as needed for comfort.   Comments RN may pronounce      01/30/17 1630    Code Status History    Date Active Date Inactive Code Status Order ID Comments User Context   07/08/2014  1:35 PM 07/09/2014  9:19 PM Full Code [id number]  Cristal Ford, DO Inpatient   12/06/2011  3:39 PM 12/08/2011  5:06 PM Full Code [id number]  Eliezer Lofts, RN ED    Advance Directive Documentation   Flowsheet Row Most Recent Value  Type of Advance Directive  Healthcare Power of Attorney  Pre-existing out of facility DNR order (yellow form or pink MOST form)  No data  "MOST" Form in Place?  No data      TOTAL TIME TAKING CARE OF THIS PATIENT: 37 minutes.    Gladstone Lighter M.D on 02/01/2017 at 10:07 AM  Between 7am to 6pm - Pager - [phone]  After 6pm go to [email or web address] - Proofreader  [facility] [place] [facility]  Office  [phone]  CC: Primary care physician; PROVIDER NOT IN SYSTEM   Note: This dictation was prepared with Dragon dictation along with smaller phrase technology. Any transcriptional errors that result from this process are unintentional.

## 2017-02-02 LAB — URINE CULTURE: Culture: >=100000 — AB

## 2017-02-02 NOTE — Progress Notes (Signed)
Patient is medically stable for D/C to [facility] today. Per [facility] admissions coordinator at [facility] patient will go to room 1201. RN will call report to [facility] [place] and arrange EMS for transport after 12 noon. RN has agreed to call patient's daughter Jeral Fruit when EMS arrives. Clinical Education officer, museum (CSW) sent D/C orders to [facility] via [facility]. Patient is aware of above. CSW contacted patient's daughter Jeral Fruit and made her aware of above. Patient's son Ronalee Belts completed admissions paper work at [facility] on yesterday. Please reconsult if future social work needs arise. CSW signing off.   [employer], LCSW [phone]

## 2017-02-02 NOTE — Progress Notes (Signed)
Pt and pt's daughter Jeral Fruit gave PT permission to call pt's orthopedic doctor (Dr. Kathryne Hitch at [facility] in [place]) and PT left message 02/01/17 to clarify any WB'ing restrictions and any activity restrictions.  Received phone call back this morning (02/02/17) from [facility] who reported MD recommendations are for: WBAT B LE's with walker as long as minimal pain; upper body strengthening with OT.  Leitha Bleak, [place]

## 2017-02-02 NOTE — Discharge Summary (Signed)
[place] [facility] [place] NAME: Matthew Bright    MR#:  [id number]  DATE OF BIRTH:  22-May-1926  DATE OF ADMISSION:  01/30/2017   ADMITTING PHYSICIAN: Nicholes Mango, MD  DATE OF DISCHARGE: 02/02/2017  PRIMARY CARE PHYSICIAN: PROVIDER NOT IN SYSTEM   ADMISSION DIAGNOSIS:   Generalized weakness [R53.1] Urinary tract infection with hematuria, site unspecified [N39.0, R31.9]  DISCHARGE DIAGNOSIS:   Active Problems:   Acute cystitis   Pressure injury of skin   SECONDARY DIAGNOSIS:   Past Medical History:  Diagnosis Date  . Allergy   . Carotid artery disease ([place])    per CTA but no significant stenosis  . Cerebellar stroke, acute ([place]) 2008   denies residual on 07/08/2014  . Enlarged prostate   . Exertional dyspnea   . Hernia 2006  . Hyperlipidemia   . Hypertension   . OA (osteoarthritis)    "~ qwhere you can have it"  . Prostate cancer [facility])    pt denies this hx on 07/08/2014  . Special screening for malignant neoplasms, colon   . Syncope and collapse  2008; October 2012  . Varicose veins   . Vertigo 2008    HOSPITAL COURSE:   83 year old male with past medical history significant for hypertension, hyperlipidemia, coronary artery disease, arthritis, recent right hip fracture, history of CVA with poor vision presents from assisted living facility due to extreme weakness and noted to have UTI.  #1 acute cystitis- urine cultures with E.coli. -received Rocephin here, blood cultures are negative - wbc improved -No fevers. Discharge on cefnidir  #2 generalized weakness-secondary to infection and worsening arthritis.  -Physical therapy consulted and rehabilitation at discharge.  #3 hypokalemia-replaced appropriately Chronically on potassium supplements  #4 BPH-Flomax  #5 recent hip and pelvis injury-weight bearing as tolerated, however patient following with [facility] orthopedics and according to daughter, patient has been  in wheelchair with walker transfers to bedside commode. Nonweightbearing both legs. -Follow up with orthopedics as prior recommended   Discharge today to rehab   DISCHARGE CONDITIONS:   Guarded  CONSULTS OBTAINED:    none  DRUG ALLERGIES:   Allergies  Allergen Reactions  . Avalide [Irbesartan-Hydrochlorothiazide] Other (See Comments)    Unknown  . Crestor [Rosuvastatin] Other (See Comments)    Muscle weakness in legs Reaction Unknown  . Darvocet [Propoxyphene N-Acetaminophen] Other (See Comments)    Unknown  . Lovastatin     Other reaction(s): Muscle Pain Weakness in legs Unknown  . Propoxyphene   . Zocor [Simvastatin]     Other reaction(s): Muscle Pain Weakness in legs Unknown   DISCHARGE MEDICATIONS:   Allergies as of 02/02/2017      Reactions   Avalide [irbesartan-hydrochlorothiazide] Other (See Comments)   Unknown   Crestor [rosuvastatin] Other (See Comments)   Muscle weakness in legs Reaction Unknown   Darvocet [propoxyphene N-acetaminophen] Other (See Comments)   Unknown   Lovastatin    Other reaction(s): Muscle Pain Weakness in legs Unknown   Propoxyphene    Zocor [simvastatin]    Other reaction(s): Muscle Pain Weakness in legs Unknown      Medication List    TAKE these medications   cefdinir 300 MG capsule Commonly known as:  OMNICEF Take 1 capsule (300 mg total) by mouth 2 (two) times daily. X 4 more days   CENTRUM PO Take 1 tablet by mouth daily.   Cholecalciferol 2000 units Caps Take 4,000 Units by mouth daily.   clopidogrel 75  MG tablet Commonly known as:  PLAVIX Take 75 mg by mouth daily.   COQ10 PO Take 1 capsule by mouth daily.   docusate sodium 100 MG capsule Commonly known as:  COLACE Take 1 capsule (100 mg total) by mouth 2 (two) times daily.   fludrocortisone 0.1 MG tablet Commonly known as:  FLORINEF Take 0.1 mg by mouth daily.   HYDROcodone-acetaminophen 5-325 MG tablet Commonly known as:   NORCO/VICODIN Take 1 tablet by mouth every 6 (six) hours as needed for moderate pain.   MINERIN Crea Apply 1 application topically as needed. Apply liberal amount topically to dry skin as needed.   polyethylene glycol packet Commonly known as:  MIRALAX / GLYCOLAX Take 17 g by mouth daily as needed.   potassium chloride 10 MEQ tablet Commonly known as:  K-DUR Take 1 tablet (10 mEq total) by mouth daily.   tamsulosin 0.4 MG Caps capsule Commonly known as:  FLOMAX Take 1 capsule (0.4 mg total) by mouth daily.        DISCHARGE INSTRUCTIONS:   1. Physical therapy 2. PCP follow-up in 1 week 3. Please do bladder scan if not voiding in 6-8 hrs from last void- if >353ml- please do an in and out cath. If had to be In and out catheterized for > 3 times- place a foley catheter and urology consult in 1 week  DIET:   Cardiac diet  ACTIVITY:   Activity as tolerated  OXYGEN:   Home Oxygen: No Oxygen Delivery: room air  DISCHARGE LOCATION:   nursing home   If you experience worsening of your admission symptoms, develop shortness of breath, life threatening emergency, suicidal or homicidal thoughts you must seek medical attention immediately by calling 911 or calling your MD immediately  if symptoms less severe.  You Must read complete instructions/literature along with all the possible adverse reactions/side effects for all the Medicines you take and that have been prescribed to you. Take any new Medicines after you have completely understood and accpet all the possible adverse reactions/side effects.   Please note  You were cared for by a hospitalist during your hospital stay. If you have any questions about your discharge medications or the care you received while you were in the hospital after you are discharged, you can call the unit and asked to speak with the hospitalist on call if the hospitalist that took care of you is not available. Once you are discharged, your primary  care physician will handle any further medical issues. Please note that NO REFILLS for any discharge medications will be authorized once you are discharged, as it is imperative that you return to your primary care physician (or establish a relationship with a primary care physician if you do not have one) for your aftercare needs so that they can reassess your need for medications and monitor your lab values.    On the day of Discharge:  VITAL SIGNS:   Blood pressure (!) 148/65, pulse 71, temperature 98.1 F (36.7 C), temperature source Oral, resp. rate 20, height 5\' 8"  (1.727 m), weight 68.3 kg (150 lb 8 oz), SpO2 94 %.  PHYSICAL EXAMINATION:   GENERAL:  83 y.o.-year-old elderly patient lying in the bed with no acute distress.  EYES: Pupils equal, round, reactive to light and accommodation. No scleral icterus. Extraocular muscles intact.  HEENT: Head atraumatic, normocephalic. Oropharynx and nasopharynx clear.  NECK:  Supple, no jugular venous distention. No thyroid enlargement, no tenderness.  LUNGS: Normal breath sounds bilaterally,  no wheezing, rales,rhonchi or crepitation. No use of accessory muscles of respiration. Decreased bibasilar breath sounds CARDIOVASCULAR: S1, S2 normal. No  rubs, or gallops. 3/6 systolic murmur is present ABDOMEN: Soft, nontender, nondistended. Bowel sounds present. No organomegaly or mass.  EXTREMITIES: No pedal edema, cyanosis, or clubbing.  NEUROLOGIC: Cranial nerves II through XII are intact. Muscle strength 5/5 in LUE, RUE is 3/5- weaker for almost an year. Strength is 3/5 in both lower extremities. Sensation intact. Gait not checked.  PSYCHIATRIC: The patient is alert and oriented x 3.  SKIN: No obvious rash, lesion, or ulcer.   DATA REVIEW:   CBC  Recent Labs Lab 02/01/17 0511  WBC 14.0*  HGB 10.7*  HCT 31.7*  PLT 163    Chemistries   Recent Labs Lab 01/31/17 0359 02/01/17 0511  NA 136 133*  K 3.2* 3.7  CL 104 104  CO2 26 25   GLUCOSE 84 92  BUN 22* 26*  CREATININE 0.78 0.79  CALCIUM 8.0* 8.1*  AST 24  --   ALT 15*  --   ALKPHOS 89  --   BILITOT 0.9  --      Microbiology Results  Results for orders placed or performed during the hospital encounter of 01/30/17  Urine culture     Status: Abnormal   Collection Time: 01/30/17  9:40 AM  Result Value Ref Range Status   Specimen Description URINE, RANDOM  Final   Special Requests NONE  Final   Culture >=100,000 COLONIES/mL ESCHERICHIA COLI (A)  Final   Report Status 02/02/2017 FINAL  Final   Organism ID, Bacteria ESCHERICHIA COLI (A)  Final      Susceptibility   Escherichia coli - MIC*    AMPICILLIN <=2 SENSITIVE Sensitive     CEFAZOLIN <=4 SENSITIVE Sensitive     CEFTRIAXONE <=1 SENSITIVE Sensitive     CIPROFLOXACIN <=0.25 SENSITIVE Sensitive     GENTAMICIN <=1 SENSITIVE Sensitive     IMIPENEM <=0.25 SENSITIVE Sensitive     NITROFURANTOIN <=16 SENSITIVE Sensitive     TRIMETH/SULFA <=20 SENSITIVE Sensitive     AMPICILLIN/SULBACTAM <=2 SENSITIVE Sensitive     PIP/TAZO <=4 SENSITIVE Sensitive     Extended ESBL NEGATIVE Sensitive     * >=100,000 COLONIES/mL ESCHERICHIA COLI  Culture, blood (routine x 2)     Status: None (Preliminary result)   Collection Time: 01/30/17  1:45 PM  Result Value Ref Range Status   Specimen Description BLOOD  R ARM  Final   Special Requests BOTTLES DRAWN AEROBIC AND ANAEROBIC BCHV  Final   Culture NO GROWTH 3 DAYS  Final   Report Status PENDING  Incomplete  Culture, blood (routine x 2)     Status: None (Preliminary result)   Collection Time: 01/30/17  1:50 PM  Result Value Ref Range Status   Specimen Description BLOOD  L ARM  Final   Special Requests BOTTLES DRAWN AEROBIC AND ANAEROBIC  BCAV  Final   Culture NO GROWTH 3 DAYS  Final   Report Status PENDING  Incomplete  MRSA PCR Screening     Status: None   Collection Time: 01/30/17  6:55 PM  Result Value Ref Range Status   MRSA by PCR NEGATIVE NEGATIVE Final     Comment:        The GeneXpert MRSA Assay (FDA approved for NASAL specimens only), is one component of a comprehensive MRSA colonization surveillance program. It is not intended to diagnose MRSA infection nor to guide or monitor treatment  for MRSA infections.     RADIOLOGY:  No results found.   Management plans discussed with the patient, family and they are in agreement.  CODE STATUS:     Code Status Orders        Start     Ordered   01/30/17 1631  Do not attempt resuscitation (DNR)  Continuous    Question Answer Comment  In the event of cardiac or respiratory ARREST Do not call a "code blue"   In the event of cardiac or respiratory ARREST Do not perform Intubation, CPR, defibrillation or ACLS   In the event of cardiac or respiratory ARREST Use medication by any route, position, wound care, and other measures to relive pain and suffering. May use oxygen, suction and manual treatment of airway obstruction as needed for comfort.   Comments RN may pronounce      01/30/17 1630    Code Status History    Date Active Date Inactive Code Status Order ID Comments User Context   07/08/2014  1:35 PM 07/09/2014  9:19 PM Full Code [id number]  Cristal Ford, DO Inpatient   12/06/2011  3:39 PM 12/08/2011  5:06 PM Full Code [id number]  Eliezer Lofts, RN ED    Advance Directive Documentation   Flowsheet Row Most Recent Value  Type of Advance Directive  Healthcare Power of Attorney  Pre-existing out of facility DNR order (yellow form or pink MOST form)  No data  "MOST" Form in Place?  No data      TOTAL TIME TAKING CARE OF THIS PATIENT: 37 minutes.    Gladstone Lighter M.D on 02/02/2017 at 10:09 AM  Between 7am to 6pm - Pager - [phone]  After 6pm go to [email or web address] - Proofreader  [facility] [place] [facility]  Office  [phone]  CC: Primary care physician; PROVIDER NOT IN SYSTEM   Note: This dictation was prepared with Dragon dictation along with  smaller phrase technology. Any transcriptional errors that result from this process are unintentional.

## 2017-02-02 NOTE — NC FL2 (Signed)
[place] LEVEL OF CARE SCREENING TOOL     IDENTIFICATION  Patient Name: Matthew Bright Birthdate: 08/19/1926 Sex: male Admission Date (Current Location): 01/30/2017  [place] and [place] Number:  Engineering geologist and Address:  [facility], [address], [place], [place] [address]      Provider Number: [id number]  Attending Physician Name and Address:  Gladstone Lighter, MD  Relative Name and Phone Number:       Current Level of Care: Hospital Recommended Level of Care: [place] Prior Approval Number:    Date Approved/Denied:   PASRR Number: [id number]  Discharge Plan: SNF    Current Diagnoses: Patient Active Problem List   Diagnosis Date Noted  . Pressure injury of skin 01/31/2017  . Acute cystitis 01/30/2017  . Age-related osteoporosis with current pathol fracture of right femur, with routine healing, subsequent encounter 12/19/2016  . Chronic adrenal insufficiency ([place]) 05/03/2016  . Age-related osteoporosis with current pathological fracture, vertebra(e), subsequent encounter for fracture with routine healing 05/03/2016  . Benign enlargement of prostate 05/03/2016  . Arteriosclerosis of coronary artery 05/03/2016  . Cerumen impaction 05/03/2016  . CFIDS (chronic fatigue and immune dysfunction syndrome) ([place]) 05/03/2016  . Cerebrovascular accident (CVA) ([place]) 05/03/2016  . Dizziness 05/03/2016  . Abnormal serum enzyme level 05/03/2016  . Fall 05/03/2016  . H/O adenomatous polyp of colon 05/03/2016  . BP (high blood pressure) 05/03/2016  . Arterial blood pressure decreased 05/03/2016  . HLD (hyperlipidemia) 05/03/2016  . Angulation of spine 05/03/2016  . Arthralgia of hip 05/03/2016  . Leg weakness 05/03/2016  . Spinal stenosis of lumbar region without neurogenic claudication 05/03/2016  . Impaired mobility 05/03/2016  . Muscle ache 05/03/2016  . Need for vaccination 05/03/2016  . Arthritis,  degenerative 05/03/2016  . Osteopenia 05/03/2016  . Venous (peripheral) insufficiency 05/03/2016  . Encounter for screening for malignant neoplasm of prostate 05/03/2016  . Occlusion and stenosis of vertebral artery 05/03/2016  . Encounter for general adult medical examination without abnormal findings 05/03/2016  . Episode of syncope 05/03/2016  . Pes varus 05/03/2016  . Head revolving around 05/03/2016  . Avitaminosis D 05/03/2016  . Mitral regurgitation 04/17/2015  . TIA (transient ischemic attack) 07/08/2014  . Aortic valve disease 04/05/2014  . Asymptomatic varicose veins 08/16/2013  . Hyposmolality and/or hyponatremia 04/02/2013  . Syncope, vasovagal 12/08/2011  . Hypercholesterolemia 11/30/2011  . HTN (hypertension) 10/22/2011  . Abnormal EKG 10/22/2011    Orientation RESPIRATION BLADDER Height & Weight     Self, Time, Situation, Place  Normal Incontinent Weight: 150 lb 8 oz (68.3 kg) Height:  5\' 8"  (172.7 cm)  BEHAVIORAL SYMPTOMS/MOOD NEUROLOGICAL BOWEL NUTRITION STATUS      Incontinent Diet (Normal)  AMBULATORY STATUS COMMUNICATION OF NEEDS Skin   Extensive Assist Verbally Normal                       Personal Care Assistance Level of Assistance  Bathing, Feeding, Dressing, Total care Bathing Assistance: Limited assistance Feeding assistance: Limited assistance Dressing Assistance: Limited assistance Total Care Assistance: Limited assistance   Functional Limitations Info  Sight, Hearing, Speech Sight Info: Adequate Hearing Info: Adequate Speech Info: Adequate    SPECIAL CARE FACTORS FREQUENCY   PT and OT   5 times per week                   Contractures      Additional Factors Info  Code Status, Allergies  Code Status Info: DNR Allergies Info: Avalide,Crestor,Darvocet,Lovstatin,propoxyphene,Zocar           Current Medications (02/02/2017):  This is the current hospital active medication list Current Facility-Administered Medications   Medication Dose Route Frequency Provider Last Rate Last Dose  . acetaminophen (TYLENOL) tablet 650 mg  650 mg Oral Q6H PRN Gladstone Lighter, MD      . cefdinir (OMNICEF) capsule 300 mg  300 mg Oral BID Gladstone Lighter, MD   300 mg at 02/01/17 2030  . cholecalciferol (VITAMIN D) tablet 4,000 Units  4,000 Units Oral Daily Nicholes Mango, MD   4,000 Units at 02/01/17 1052  . clopidogrel (PLAVIX) tablet 75 mg  75 mg Oral Daily Nicholes Mango, MD   75 mg at 02/01/17 1052  . docusate sodium (COLACE) capsule 100 mg  100 mg Oral BID Nicholes Mango, MD   100 mg at 02/01/17 2030  . enoxaparin (LOVENOX) injection 40 mg  40 mg Subcutaneous Q24H Nicholes Mango, MD   40 mg at 02/01/17 2029  . HYDROcodone-acetaminophen (NORCO/VICODIN) 5-325 MG per tablet 1 tablet  1 tablet Oral Q6H PRN Nicholes Mango, MD      . multivitamin with minerals tablet 1 tablet  1 tablet Oral Daily Nicholes Mango, MD   1 tablet at 02/01/17 1052  . ondansetron (ZOFRAN) tablet 4 mg  4 mg Oral Q6H PRN Nicholes Mango, MD       Or  . ondansetron (ZOFRAN) injection 4 mg  4 mg Intravenous Q6H PRN Aruna Gouru, MD      . polyethylene glycol (MIRALAX / GLYCOLAX) packet 17 g  17 g Oral Daily PRN Nicholes Mango, MD      . potassium chloride (K-DUR,KLOR-CON) CR tablet 10 mEq  10 mEq Oral Daily Nicholes Mango, MD   10 mEq at 02/01/17 1053  . tamsulosin (FLOMAX) capsule 0.4 mg  0.4 mg Oral Daily Nicholes Mango, MD   0.4 mg at 02/01/17 1052  . terazosin (HYTRIN) capsule 1 mg  1 mg Oral QHS Dustin Flock, MD   1 mg at 02/01/17 2030     Discharge Medications: Please see discharge summary for a list of discharge medications.  Relevant Imaging Results:  Relevant Lab Results:   Additional Information SSN  [id number]  Hafsah Hendler, [place] Beets, [place]

## 2017-02-02 NOTE — Progress Notes (Signed)
Report called to Orlene Och, LPN at [facility]. EMS and daughter, Leveda Anna, called.

## 2017-02-02 NOTE — Clinical Social Work Placement (Signed)
   CLINICAL SOCIAL WORK PLACEMENT  NOTE  Date:  02/02/2017  Patient Details  Name: Matthew Bright MRN: [id number] Date of Birth: 09-03-1926  Clinical Social Work is seeking post-discharge placement for this patient at the [place] level of care (*CSW will initial, date and re-position this form in  chart as items are completed):  Yes   Patient/family provided with [place] Work Department's list of facilities offering this level of care within the geographic area requested by the patient (or if unable, by the patient's family).  Yes   Patient/family informed of their freedom to choose among providers that offer the needed level of care, that participate in Medicare, Medicaid or managed care program needed by the patient, have an available bed and are willing to accept the patient.  Yes   Patient/family informed of [facility]'s ownership interest in [facility] and [facility], as well as of the fact that they are under no obligation to receive care at these facilities.  PASRR submitted to EDS on       PASRR number received on       Existing PASRR number confirmed on 01/30/17     FL2 transmitted to all facilities in geographic area requested by pt/family on 01/30/17     FL2 transmitted to all facilities within larger geographic area on       Patient informed that his/her managed care company has contracts with or will negotiate with certain facilities, including the following:        Yes   Patient/family informed of bed offers received.  Patient chooses bed at  [facility] )     Physician recommends and patient chooses bed at      Patient to be transferred to  ([facility] ) on 02/02/17.  Patient to be transferred to facility by  [facility] )     Patient family notified on 02/02/17 of transfer.  Name of family member notified:   (Patient's daughter Jeral Fruit is aware of D/C today. )     PHYSICIAN       Additional  Comment:    _______________________________________________ Almedia Cordell, [place] Beets, LCSW 02/02/2017, 11:00 AM

## 2017-02-02 NOTE — Progress Notes (Signed)
Pt is voiding. Right arm has non-pitting edema. Right arm elevated above heart, will continue to monitor. No fevers this shift. Pt resting.

## 2017-02-04 LAB — CULTURE, BLOOD (ROUTINE X 2)
CULTURE: NO GROWTH
Culture: NO GROWTH

## 2017-02-08 ENCOUNTER — Inpatient Hospital Stay: Payer: Self-pay | Admitting: Family Medicine

## 2017-02-10 ENCOUNTER — Encounter: Payer: Self-pay | Admitting: Internal Medicine

## 2017-02-10 NOTE — Patient Instructions (Signed)
Opened in error

## 2017-02-10 NOTE — Progress Notes (Signed)
Review of Systems  Physical Exam

## 2017-03-03 ENCOUNTER — Encounter: Payer: Self-pay | Admitting: Internal Medicine

## 2017-03-03 ENCOUNTER — Non-Acute Institutional Stay (SKILLED_NURSING_FACILITY): Payer: Medicare Other | Admitting: Internal Medicine

## 2017-03-03 DIAGNOSIS — D638 Anemia in other chronic diseases classified elsewhere: Secondary | ICD-10-CM

## 2017-03-03 DIAGNOSIS — D72829 Elevated white blood cell count, unspecified: Secondary | ICD-10-CM | POA: Diagnosis not present

## 2017-03-03 DIAGNOSIS — K5900 Constipation, unspecified: Secondary | ICD-10-CM

## 2017-03-03 DIAGNOSIS — N390 Urinary tract infection, site not specified: Secondary | ICD-10-CM

## 2017-03-03 DIAGNOSIS — B9620 Unspecified Escherichia coli [E. coli] as the cause of diseases classified elsewhere: Secondary | ICD-10-CM

## 2017-03-03 DIAGNOSIS — E871 Hypo-osmolality and hyponatremia: Secondary | ICD-10-CM | POA: Diagnosis not present

## 2017-03-03 DIAGNOSIS — Z8781 Personal history of (healed) traumatic fracture: Secondary | ICD-10-CM | POA: Diagnosis not present

## 2017-03-03 DIAGNOSIS — R531 Weakness: Secondary | ICD-10-CM | POA: Diagnosis not present

## 2017-03-03 DIAGNOSIS — N401 Enlarged prostate with lower urinary tract symptoms: Secondary | ICD-10-CM

## 2017-03-03 DIAGNOSIS — Z8673 Personal history of transient ischemic attack (TIA), and cerebral infarction without residual deficits: Secondary | ICD-10-CM | POA: Diagnosis not present

## 2017-03-03 DIAGNOSIS — E271 Primary adrenocortical insufficiency: Secondary | ICD-10-CM | POA: Diagnosis not present

## 2017-03-03 NOTE — Progress Notes (Signed)
LOCATION: [facility]  PCP: PROVIDER NOT IN SYSTEM   Code Status: DNR  Goals of care: Advanced Directive information Advanced Directives 02/10/2017  Does Patient Have a Medical Advance Directive? Yes  Type of Advance Directive Out of facility DNR (pink MOST or yellow form)  Does patient want to make changes to medical advance directive? No - Patient declined  Copy of [place] in Chart? -  Would patient like information on creating a medical advance directive? -  Pre-existing out of facility DNR order (yellow form or pink MOST form) -       Extended Emergency Contact Information Primary Emergency Contact: Geno,Betty Address: [address]          [place], [place] [address] [name] Phone: [phone] Relation: Spouse Secondary Emergency Contact: Salvadore Dom, [place] [address] Johnnette Litter of [employer] Phone: [phone] Relation: Daughter   Allergies  Allergen Reactions  . Avalide [Irbesartan-Hydrochlorothiazide] Other (See Comments)    Unknown  . Crestor [Rosuvastatin] Other (See Comments)    Muscle weakness in legs Reaction Unknown  . Darvocet [Propoxyphene N-Acetaminophen] Other (See Comments)    Unknown  . Lovastatin     Other reaction(s): Muscle Pain Weakness in legs Unknown  . Propoxyphene   . Zocor [Simvastatin]     Other reaction(s): Muscle Pain Weakness in legs Unknown    Chief Complaint  Patient presents with  . New Admit To SNF    New Admission Visit      HPI:  Patient is a 83 y.o. male seen today for short term rehabilitation post hospital admission from 01/30/17-02/02/17 with generalized weakness from e.coli UTI. He is seen in his room today. He has medical history of HTN, HLD, CAD, arthritis, CVA, recent hip fracture among others. He has been working with therapy team. He has swelling to his right arm for several months that has subsided some post taping and arm elevation. Of  note, patient was residing at [facility] prior to this hospitalization.   Review of Systems:  Constitutional: Negative for fever, chills, diaphoresis.  HENT: Negative for headache, congestion, nasal discharge, sore throat, dysphagia.  Eyes: Negative for double vision and discharge. He has poor peripheral vision.   Respiratory: Negative for cough, shortness of breath and wheezing.   Cardiovascular: Negative for chest pain, palpitations, leg swelling.  Gastrointestinal: Negative for heartburn, nausea, vomiting, abdominal pain, loss of appetite. Last bowel movement was yesterday. Genitourinary: Negative for dysuria. Musculoskeletal: Negative for fall in the facility. Positive for mid back pain when laying flat down.  Skin: Negative for itching, rash.  Neurological: Negative for dizziness. Psychiatric/Behavioral: Negative for depression.   Past Medical History:  Diagnosis Date  . Allergy   . Carotid artery disease ([place])    per CTA but no significant stenosis  . Cerebellar stroke, acute ([place]) 2008   denies residual on 07/08/2014  . Enlarged prostate   . Exertional dyspnea   . Hernia 2006  . Hyperlipidemia   . Hypertension   . OA (osteoarthritis)    "~ qwhere you can have it"  . Prostate cancer [facility])    pt denies this hx on 07/08/2014  . Special screening for malignant neoplasms, colon   . Syncope and collapse  2008; October 2012  . Varicose veins   . Vertigo 2008   Past Surgical History:  Procedure Laterality Date  . ANTERIOR CERVICAL DECOMP/DISCECTOMY FUSION  11/1998  C5-C6  . CARDIAC CATHETERIZATION  02/2006   Cath at [facility]; no significant CAD noted.   Marland Kitchen CARDIOVASCULAR STRESS TEST  11/13/2007   EF 67%, NO ISCHEMIA   . CATARACT EXTRACTION W/ INTRAOCULAR LENS  IMPLANT, BILATERAL Bilateral 2010  . COLONOSCOPY  2008   Dr. Sharlett Iles in [place]  . HEMORRHOID SURGERY  1980's  . INGUINAL HERNIA REPAIR Right 2007,2009  . LUMBAR MICRODISCECTOMY  1999  . PROSTATE BIOPSY  ~ 1990; ~ 2009  .  TOTAL HIP ARTHROPLASTY Bilateral 2000-2001   Dr. Percell Miller  . VEIN LIGATION AND STRIPPING Left 1953, 1968, 1980's; 1992  . VEIN LIGATION AND STRIPPING Right 1992   Social History:   reports that he has never smoked. He has never used smokeless tobacco. He reports that he does not drink alcohol or use drugs.  Family History  Problem Relation Age of Onset  . Stroke Father   . Heart attack Father     Medications: Allergies as of 03/03/2017      Reactions   Avalide [irbesartan-hydrochlorothiazide] Other (See Comments)   Unknown   Crestor [rosuvastatin] Other (See Comments)   Muscle weakness in legs Reaction Unknown   Darvocet [propoxyphene N-acetaminophen] Other (See Comments)   Unknown   Lovastatin    Other reaction(s): Muscle Pain Weakness in legs Unknown   Propoxyphene    Zocor [simvastatin]    Other reaction(s): Muscle Pain Weakness in legs Unknown      Medication List       Accurate as of 03/03/17 12:53 PM. Always use your most recent med list.          CENTRUM PO Take 1 tablet by mouth daily.   Cholecalciferol 2000 units Caps Take 4,000 Units by mouth daily.   clopidogrel 75 MG tablet Commonly known as:  PLAVIX Take 75 mg by mouth daily.   COQ10 PO Take 1 capsule by mouth daily.   docusate sodium 100 MG capsule Commonly known as:  COLACE Take 1 capsule (100 mg total) by mouth 2 (two) times daily.   fludrocortisone 0.1 MG tablet Commonly known as:  FLORINEF Take 0.1 mg by mouth daily.   HYDROcodone-acetaminophen 5-325 MG tablet Commonly known as:  NORCO/VICODIN Take 1 tablet by mouth every 6 (six) hours as needed for moderate pain.   MINERIN Crea Apply 1 application topically as needed. Apply liberal amount topically to dry skin as needed.   polyethylene glycol packet Commonly known as:  MIRALAX / GLYCOLAX Take 17 g by mouth daily as needed.   potassium chloride 10 MEQ tablet Commonly known as:  K-DUR Take 1 tablet (10 mEq total) by mouth  daily.   tamsulosin 0.4 MG Caps capsule Commonly known as:  FLOMAX Take 1 capsule (0.4 mg total) by mouth daily.       Immunizations: Immunization History  Administered Date(s) Administered  . PPD Test 02/07/2017, 02/21/2017     Physical Exam: Vitals:   03/03/17 1248  BP: (!) 146/83  Pulse: 74  Resp: 18  Temp: (!) 96.7 F (35.9 C)  TempSrc: Oral  SpO2: 97%  Weight: 157 lb 6.4 oz (71.4 kg)  Height: 5\' 8"  (1.727 m)   Body mass index is 23.93 kg/m.  General- elderly male, frail, in no acute distress Head- normocephalic, atraumatic Nose-  no nasal discharge Throat- moist mucus membrane, has dentures Eyes- no pallor, no icterus, no discharge, normal conjunctiva, normal sclera Neck- no cervical lymphadenopathy Cardiovascular- normal E9,B2, + systolic murmur Respiratory- bilateral clear to auscultation, no  wheeze, no rhonchi, no crackles, no use of accessory muscles Abdomen- bowel sounds present, soft, non tender, no guarding or rigidity Musculoskeletal- able to move all 4 extremities, no leg edema, has kyphosis and scoliosis, severe arthritis changes to her fingers, edema to right arm and hand Neurological- alert and oriented to person, place and time Skin- warm and dry Psychiatry- normal mood and affect    Labs reviewed: Basic Metabolic Panel:  Recent Labs  05/06/16 1141  01/30/17 0940 01/31/17 0359 02/01/17 0511  NA 128*  --  137 136 133*  K 3.8  --  3.5 3.2* 3.7  CL 92*  --  103 104 104  CO2 27  --  27 26 25   GLUCOSE 112*  --  83 84 92  BUN 26*  --  25* 22* 26*  CREATININE 0.87  < > 0.57* 0.78 0.79  CALCIUM 8.8  --  8.7* 8.0* 8.1*  MG 1.9  --   --   --   --   < > = values in this interval not displayed. Liver Function Tests:  Recent Labs  01/30/17 0940 01/31/17 0359  AST 24 24  ALT 17 15*  ALKPHOS 103 89  BILITOT 0.8 0.9  PROT 6.3* 5.5*  ALBUMIN 3.6 3.0*   No results for input(s): LIPASE, AMYLASE in the last 8760 hours. No results for  input(s): AMMONIA in the last 8760 hours. CBC:  Recent Labs  01/30/17 0940 01/31/17 0359 02/01/17 0511  WBC 11.6* 21.6* 14.0*  NEUTROABS 9.5*  --   --   HGB 12.7* 11.8* 10.7*  HCT 36.5* 34.1* 31.7*  MCV 92.0 91.9 92.2  PLT 199 188 163   Cardiac Enzymes:  Recent Labs  01/30/17 0940  TROPONINI 0.03*   BNP: Invalid input(s): POCBNP CBG: No results for input(s): GLUCAP in the last 8760 hours.  Radiological Exams: No results found.  Assessment/Plan  Generalized weakness Will have him work with physical therapy and occupational therapy team to help with gait training and muscle strengthening exercises.fall precautions. Skin care. Encourage to be out of bed.   E.coli UTI Completed his antibiotic and is currently asymptomatic. Monitor clinically  Leukocytosis Afebrile now, completed his antibiotic, check wbc  Anemia of chronic disease Monitor cbc  Hyponatremia Check bmp, maintain hydration  Constipation Continue colace 100 mg bid and miralax prn  Adrenal insufficiency Continue fludrocortisone 0.1 mg daily, monitor vital signs  History of CVA Continue plavix 75 mg daily  BPH Stable on flomax, no changes made  History of right hip fracture Fall precautions, gait training, safe transfers, continue vitamin d supplement and norco 5-325 mg q6h prn pain    Goals of care: short term rehabilitation, to return to ALF post rehabilitation   Labs/tests ordered: cbc, cmp 03/04/17  Family/ staff Communication: reviewed care plan with patient and nursing supervisor    Blanchie Serve, MD Internal Medicine [facility] [address] [place], [place] [address] Cell Phone (Monday-Friday 8 am - 5 pm): [phone] On Call: [phone] and follow prompts after 5 pm and on weekends Office Phone: [phone] Office Fax: [phone]

## 2017-03-04 LAB — CBC AND DIFFERENTIAL
HEMATOCRIT: 35 % — AB (ref 41–53)
Hemoglobin: 11.6 g/dL — AB (ref 13.5–17.5)
Neutrophils Absolute: 6 /uL
Platelets: 234 10*3/uL (ref 150–399)
WBC: 9.6 10*3/mL

## 2017-03-04 LAB — BASIC METABOLIC PANEL
BUN: 26 mg/dL — AB (ref 4–21)
Creatinine: 0.8 mg/dL (ref 0.6–1.3)
Glucose: 81 mg/dL
Potassium: 4.1 mmol/L (ref 3.4–5.3)
SODIUM: 139 mmol/L (ref 137–147)

## 2017-03-07 ENCOUNTER — Non-Acute Institutional Stay (SKILLED_NURSING_FACILITY): Payer: Medicare Other | Admitting: Family

## 2017-03-07 ENCOUNTER — Encounter: Payer: Self-pay | Admitting: Family

## 2017-03-07 DIAGNOSIS — N400 Enlarged prostate without lower urinary tract symptoms: Secondary | ICD-10-CM | POA: Diagnosis not present

## 2017-03-07 DIAGNOSIS — E559 Vitamin D deficiency, unspecified: Secondary | ICD-10-CM | POA: Diagnosis not present

## 2017-03-07 DIAGNOSIS — M79641 Pain in right hand: Secondary | ICD-10-CM | POA: Diagnosis not present

## 2017-03-07 DIAGNOSIS — E440 Moderate protein-calorie malnutrition: Secondary | ICD-10-CM

## 2017-03-07 DIAGNOSIS — K5901 Slow transit constipation: Secondary | ICD-10-CM

## 2017-03-07 DIAGNOSIS — I6930 Unspecified sequelae of cerebral infarction: Secondary | ICD-10-CM

## 2017-03-07 DIAGNOSIS — M6281 Muscle weakness (generalized): Secondary | ICD-10-CM

## 2017-03-07 NOTE — Progress Notes (Signed)
Location:  [place] of Service:  SNF (31) Provider: Marlowe Sax FNP-C   PROVIDER NOT IN SYSTEM  Patient Care Team: Provider Not In System as PCP - General Seeplaputhur Robinette Haines, MD (General Surgery)  Extended Emergency Contact Information Primary Emergency Contact: Allmendinger,Betty Address: [address]          [place], [place] [address] Johnnette Litter of [place] Phone: [phone] Relation: Spouse Secondary Emergency Contact: Salvadore Dom, [place] [address] Johnnette Litter of [place] Mobile Phone: [phone] Relation: Daughter  Code Status:  Goals of care: Advanced Directive information Advanced Directives 03/07/2017  Does Patient Have a Medical Advance Directive? Yes  Type of Advance Directive Out of facility DNR (pink MOST or yellow form)  Does patient want to make changes to medical advance directive? No - Patient declined  Copy of [place] in Chart? -  Would patient like information on creating a medical advance directive? -  Pre-existing out of facility DNR order (yellow form or pink MOST form) Yellow form placed in chart (order not valid for inpatient use)     Chief Complaint  Patient presents with  . Discharge Note    discharge to [facility]     HPI:  Pt is a 83 y.o. male seen today [facility] for medical management of chronic diseases.     Past Medical History:  Diagnosis Date  . Allergy   . Carotid artery disease ([place])    per CTA but no significant stenosis  . Cerebellar stroke, acute ([place]) 2008   denies residual on 07/08/2014  . Enlarged prostate   . Exertional dyspnea   . Hernia 2006  . Hyperlipidemia   . Hypertension   . OA (osteoarthritis)    "~ qwhere you can have it"  . Prostate cancer [facility])    pt denies this hx on 07/08/2014  . Special screening for malignant neoplasms, colon   . Syncope and collapse  2008; October 2012  . Varicose veins   . Vertigo 2008    Past Surgical History:  Procedure Laterality Date  . ANTERIOR CERVICAL DECOMP/DISCECTOMY FUSION  11/1998   C5-C6  . CARDIAC CATHETERIZATION  02/2006   Cath at [facility]; no significant CAD noted.   Marland Kitchen CARDIOVASCULAR STRESS TEST  11/13/2007   EF 67%, NO ISCHEMIA   . CATARACT EXTRACTION W/ INTRAOCULAR LENS  IMPLANT, BILATERAL Bilateral 2010  . COLONOSCOPY  2008   Dr. Sharlett Iles in [place]  . HEMORRHOID SURGERY  1980's  . INGUINAL HERNIA REPAIR Right 2007,2009  . LUMBAR MICRODISCECTOMY  1999  . PROSTATE BIOPSY  ~ 1990; ~ 2009  . TOTAL HIP ARTHROPLASTY Bilateral 2000-2001   Dr. Percell Miller  . VEIN LIGATION AND STRIPPING Left 1953, 1968, 1980's; 1992  . VEIN LIGATION AND STRIPPING Right 1992    Allergies  Allergen Reactions  . Avalide [Irbesartan-Hydrochlorothiazide] Other (See Comments)    Unknown  . Crestor [Rosuvastatin] Other (See Comments)    Muscle weakness in legs Reaction Unknown  . Darvocet [Propoxyphene N-Acetaminophen] Other (See Comments)    Unknown  . Lovastatin     Other reaction(s): Muscle Pain Weakness in legs Unknown  . Propoxyphene   . Zocor [Simvastatin]     Other reaction(s): Muscle Pain Weakness in legs Unknown    Allergies as of 03/07/2017      Reactions   Avalide [irbesartan-hydrochlorothiazide] Other (See Comments)   Unknown  Crestor [rosuvastatin] Other (See Comments)   Muscle weakness in legs Reaction Unknown   Darvocet [propoxyphene N-acetaminophen] Other (See Comments)   Unknown   Lovastatin    Other reaction(s): Muscle Pain Weakness in legs Unknown   Propoxyphene    Zocor [simvastatin]    Other reaction(s): Muscle Pain Weakness in legs Unknown      Medication List       Accurate as of 03/07/17  1:27 PM. Always use your most recent med list.          CENTRUM PO Take 1 tablet by mouth daily.   Cholecalciferol 2000 units Caps Take 4,000 Units by mouth daily.   clopidogrel 75 MG tablet Commonly known as:  PLAVIX Take 75 mg by mouth  daily.   COQ10 PO Take 1 capsule by mouth daily.   docusate sodium 100 MG capsule Commonly known as:  COLACE Take 1 capsule (100 mg total) by mouth 2 (two) times daily.   fludrocortisone 0.1 MG tablet Commonly known as:  FLORINEF Take 0.1 mg by mouth daily.   HYDROcodone-acetaminophen 5-325 MG tablet Commonly known as:  NORCO/VICODIN Take 1 tablet by mouth every 6 (six) hours as needed for moderate pain.   MINERIN Crea Apply 1 application topically as needed. Apply liberal amount topically to dry skin as needed.   polyethylene glycol packet Commonly known as:  MIRALAX / GLYCOLAX Take 17 g by mouth daily as needed.   potassium chloride 10 MEQ tablet Commonly known as:  K-DUR Take 1 tablet (10 mEq total) by mouth daily.   tamsulosin 0.4 MG Caps capsule Commonly known as:  FLOMAX Take 1 capsule (0.4 mg total) by mouth daily.       Review of Systems  Immunization History  Administered Date(s) Administered  . PPD Test 02/07/2017, 02/21/2017   Pertinent  Health Maintenance Due  Topic Date Due  . PNA vac Low Risk Adult (1 of 2 - PCV13) 06/20/1993  . INFLUENZA VACCINE  07/20/2016   No flowsheet data found. Functional Status Survey:    Vitals:   03/07/17 0855  BP: 132/78  Pulse: 76  Resp: 18  Temp: 98.2 F (36.8 C)  TempSrc: Oral  SpO2: 94%  Weight: 157 lb 6.4 oz (71.4 kg)  Height: 5\' 8"  (1.727 m)   Body mass index is 23.93 kg/m. Physical Exam  Labs reviewed:  Recent Labs  05/06/16 1141  01/30/17 0940 01/31/17 0359 02/01/17 0511  NA 128*  --  137 136 133*  K 3.8  --  3.5 3.2* 3.7  CL 92*  --  103 104 104  CO2 27  --  27 26 25   GLUCOSE 112*  --  83 84 92  BUN 26*  --  25* 22* 26*  CREATININE 0.87  < > 0.57* 0.78 0.79  CALCIUM 8.8  --  8.7* 8.0* 8.1*  MG 1.9  --   --   --   --   < > = values in this interval not displayed.  Recent Labs  01/30/17 0940 01/31/17 0359  AST 24 24  ALT 17 15*  ALKPHOS 103 89  BILITOT 0.8 0.9  PROT 6.3* 5.5*   ALBUMIN 3.6 3.0*    Recent Labs  01/30/17 0940 01/31/17 0359 02/01/17 0511  WBC 11.6* 21.6* 14.0*  NEUTROABS 9.5*  --   --   HGB 12.7* 11.8* 10.7*  HCT 36.5* 34.1* 31.7*  MCV 92.0 91.9 92.2  PLT 199 188 163   Lab Results  Component  Value Date   TSH 2.152 03/19/2015   Lab Results  Component Value Date   HGBA1C 5.8 (H) 07/08/2014   Lab Results  Component Value Date   CHOL 194 05/06/2016   HDL 56 05/06/2016   LDLCALC 100 05/06/2016   TRIG 189 (H) 05/06/2016   CHOLHDL 3.5 05/06/2016    Significant Diagnostic Results in last 30 days:  No results found.  Assessment/Plan 1. Generalized muscle weakness   2. Benign prostatic hyperplasia without lower urinary tract symptoms   3. Right hand pain   4. Avitaminosis D   5. Slow transit constipation  6. History of CVA with residual deficit     Family/ staff Communication: Reviewed plan of care with patient and facility Nurse supervisor   Labs/tests ordered: None   Nelda Bucks Ngetich, NP

## 2017-03-07 NOTE — Progress Notes (Signed)
Location:  [facility] Room Number: 6294 Place of Service:  SNF (31)  Provider: Marlowe Sax FNP-C   PCP: PROVIDER NOT IN SYSTEM Patient Care Team: Provider Not In System as PCP - General Seeplaputhur Robinette Haines, MD (General Surgery)  Extended Emergency Contact Information Primary Emergency Contact: Coia,Betty Address: [address]          [place], [place] [address] Johnnette Litter of [place] Phone: [phone] Relation: Spouse Secondary Emergency Contact: Salvadore Dom, [place] [address] Johnnette Litter of [place] Mobile Phone: [phone] Relation: Daughter  Code Status: DNR Goals of care:  Advanced Directive information Advanced Directives 03/07/2017  Does Patient Have a Medical Advance Directive? Yes  Type of Advance Directive Out of facility DNR (pink MOST or yellow form)  Does patient want to make changes to medical advance directive? No - Patient declined  Copy of [place] in Chart? -  Would patient like information on creating a medical advance directive? -  Pre-existing out of facility DNR order (yellow form or pink MOST form) Yellow form placed in chart (order not valid for inpatient use)     Allergies  Allergen Reactions  . Avalide [Irbesartan-Hydrochlorothiazide] Other (See Comments)    Unknown  . Crestor [Rosuvastatin] Other (See Comments)    Muscle weakness in legs Reaction Unknown  . Darvocet [Propoxyphene N-Acetaminophen] Other (See Comments)    Unknown  . Lovastatin     Other reaction(s): Muscle Pain Weakness in legs Unknown  . Propoxyphene   . Zocor [Simvastatin]     Other reaction(s): Muscle Pain Weakness in legs Unknown    Chief Complaint  Patient presents with  . Discharge Note    discharge to [facility]     HPI:  83 y.o. male seen today at [facility] for discharge home. He was here for short term rehabilitation for post hospital admission  from 01/30/2017-02/02/2017 with generalized weakness from E.coli UTI. He has a medical history of HTN,Hyperlipidemia,CAD,CVA, BPH,OA among other conditions.He is seen in his room today. He denies any acute issues this visit.He has had unremarkable stay her in Rehab. He has worked well with PT/OT now stable for discharge to [facility].He will be discharged with Home health PT/OT to continue with ROM, Exercise, Gait stability and muscle strengthening. He does not require any DME.Home health services will be arranged by facility social worker prior to discharge. Prescription medication will be written x 1 month then patient to follow up with PCP in 1-2 weeks.Facility staff report no new concerns.    Past Medical History:  Diagnosis Date  . Allergy   . Carotid artery disease ([place])    per CTA but no significant stenosis  . Cerebellar stroke, acute ([place]) 2008   denies residual on 07/08/2014  . Enlarged prostate   . Exertional dyspnea   . Hernia 2006  . Hyperlipidemia   . Hypertension   . OA (osteoarthritis)    "~ qwhere you can have it"  . Prostate cancer [facility])    pt denies this hx on 07/08/2014  . Special screening for malignant neoplasms, colon   . Syncope and collapse  2008; October 2012  . Varicose veins   . Vertigo 2008    Past Surgical History:  Procedure Laterality Date  . ANTERIOR CERVICAL DECOMP/DISCECTOMY FUSION  11/1998   C5-C6  . CARDIAC CATHETERIZATION  02/2006   Cath at [facility]; no significant  CAD noted.   Marland Kitchen CARDIOVASCULAR STRESS TEST  11/13/2007   EF 67%, NO ISCHEMIA   . CATARACT EXTRACTION W/ INTRAOCULAR LENS  IMPLANT, BILATERAL Bilateral 2010  . COLONOSCOPY  2008   Dr. Sharlett Iles in [place]  . HEMORRHOID SURGERY  1980's  . INGUINAL HERNIA REPAIR Right 2007,2009  . LUMBAR MICRODISCECTOMY  1999  . PROSTATE BIOPSY  ~ 1990; ~ 2009  . TOTAL HIP ARTHROPLASTY Bilateral 2000-2001   Dr. Percell Miller  . VEIN LIGATION AND STRIPPING Left 1953, 1968, 1980's; 1992  . VEIN LIGATION AND STRIPPING  Right 1992      reports that he has never smoked. He has never used smokeless tobacco. He reports that he does not drink alcohol or use drugs. Social History   Social History  . Marital status: Married    Spouse name: N/A  . Number of children: N/A  . Years of education: N/A   Occupational History  . Not on file.   Social History Main Topics  . Smoking status: Never Smoker  . Smokeless tobacco: Never Used  . Alcohol use No  . Drug use: No  . Sexual activity: No   Other Topics Concern  . Not on file   Social History Narrative  . No narrative on file    Allergies  Allergen Reactions  . Avalide [Irbesartan-Hydrochlorothiazide] Other (See Comments)    Unknown  . Crestor [Rosuvastatin] Other (See Comments)    Muscle weakness in legs Reaction Unknown  . Darvocet [Propoxyphene N-Acetaminophen] Other (See Comments)    Unknown  . Lovastatin     Other reaction(s): Muscle Pain Weakness in legs Unknown  . Propoxyphene   . Zocor [Simvastatin]     Other reaction(s): Muscle Pain Weakness in legs Unknown    Pertinent  Health Maintenance Due  Topic Date Due  . PNA vac Low Risk Adult (1 of 2 - PCV13) 06/20/1993  . INFLUENZA VACCINE  07/20/2016    Medications: Allergies as of 03/07/2017      Reactions   Avalide [irbesartan-hydrochlorothiazide] Other (See Comments)   Unknown   Crestor [rosuvastatin] Other (See Comments)   Muscle weakness in legs Reaction Unknown   Darvocet [propoxyphene N-acetaminophen] Other (See Comments)   Unknown   Lovastatin    Other reaction(s): Muscle Pain Weakness in legs Unknown   Propoxyphene    Zocor [simvastatin]    Other reaction(s): Muscle Pain Weakness in legs Unknown      Medication List       Accurate as of 03/07/17  1:36 PM. Always use your most recent med list.          CENTRUM PO Take 1 tablet by mouth daily.   Cholecalciferol 2000 units Caps Take 4,000 Units by mouth daily.   clopidogrel 75 MG tablet Commonly  known as:  PLAVIX Take 75 mg by mouth daily.   COQ10 PO Take 1 capsule by mouth daily.   docusate sodium 100 MG capsule Commonly known as:  COLACE Take 1 capsule (100 mg total) by mouth 2 (two) times daily.   fludrocortisone 0.1 MG tablet Commonly known as:  FLORINEF Take 0.1 mg by mouth daily.   HYDROcodone-acetaminophen 5-325 MG tablet Commonly known as:  NORCO/VICODIN Take 1 tablet by mouth every 6 (six) hours as needed for moderate pain.   MINERIN Crea Apply 1 application topically as needed. Apply liberal amount topically to dry skin as needed.   polyethylene glycol packet Commonly known as:  MIRALAX / GLYCOLAX Take 17 g by mouth  daily as needed.   potassium chloride 10 MEQ tablet Commonly known as:  K-DUR Take 1 tablet (10 mEq total) by mouth daily.   tamsulosin 0.4 MG Caps capsule Commonly known as:  FLOMAX Take 1 capsule (0.4 mg total) by mouth daily.       Review of Systems  Constitutional: Negative for activity change, appetite change, chills and fever.  HENT: Negative for congestion, rhinorrhea, sinus pain, sinus pressure, sneezing and sore throat.   Eyes: Negative.   Respiratory: Negative for cough, chest tightness, shortness of breath and wheezing.   Cardiovascular: Negative for chest pain, palpitations and leg swelling.  Gastrointestinal: Negative for abdominal distention, abdominal pain, constipation, diarrhea, nausea and vomiting.  Endocrine: Negative.   Genitourinary: Negative for dysuria, flank pain and urgency.  Musculoskeletal: Positive for arthralgias and gait problem.  Skin: Negative for color change, pallor, rash and wound.  Neurological: Negative for dizziness, seizures, syncope and headaches.       Leg weakness   Hematological: Does not bruise/bleed easily.  Psychiatric/Behavioral: Negative for agitation, confusion, hallucinations and sleep disturbance. The patient is not nervous/anxious.     Vitals:   03/07/17 0855  BP: 132/78  Pulse:  76  Resp: 18  Temp: 98.2 F (36.8 C)  TempSrc: Oral  SpO2: 94%  Weight: 157 lb 6.4 oz (71.4 kg)  Height: 5\' 8"  (1.727 m)   Body mass index is 23.93 kg/m. Physical Exam  Constitutional: He appears well-developed and well-nourished. No distress.  Elderly   HENT:  Head: Normocephalic.  Mouth/Throat: Oropharynx is clear and moist. No oropharyngeal exudate.  Eyes: Conjunctivae and EOM are normal. Pupils are equal, round, and reactive to light. Right eye exhibits no discharge. Left eye exhibits no discharge.  Neck: Normal range of motion. No JVD present. No thyromegaly present.  Cardiovascular: Normal rate, regular rhythm, normal heart sounds and intact distal pulses.  Exam reveals no gallop and no friction rub.   No murmur heard. Pulmonary/Chest: Effort normal and breath sounds normal. No respiratory distress. He has no wheezes. He has no rales.  Abdominal: Soft. Bowel sounds are normal. He exhibits no distension. There is no tenderness. There is no rebound and no guarding.  Musculoskeletal: He exhibits no tenderness or deformity.  Unsteady gait. Bilateral lower extremities weakness. RUE limited ROM due to pain. Right hand 1 -2+ edema   Lymphadenopathy:    He has no cervical adenopathy.  Neurological:  Alert and oriented to person, place and day.   Skin: Skin is warm and dry. No rash noted. No erythema. No pallor.  Psychiatric: He has a normal mood and affect.    Labs reviewed: Basic Metabolic Panel:  Recent Labs  05/06/16 1141  01/30/17 0940 01/31/17 0359 02/01/17 0511  NA 128*  --  137 136 133*  K 3.8  --  3.5 3.2* 3.7  CL 92*  --  103 104 104  CO2 27  --  27 26 25   GLUCOSE 112*  --  83 84 92  BUN 26*  --  25* 22* 26*  CREATININE 0.87  < > 0.57* 0.78 0.79  CALCIUM 8.8  --  8.7* 8.0* 8.1*  MG 1.9  --   --   --   --   < > = values in this interval not displayed. Liver Function Tests:  Recent Labs  01/30/17 0940 01/31/17 0359  AST 24 24  ALT 17 15*  ALKPHOS 103  89  BILITOT 0.8 0.9  PROT 6.3* 5.5*  ALBUMIN  3.6 3.0*   CBC:  Recent Labs  01/30/17 0940 01/31/17 0359 02/01/17 0511  WBC 11.6* 21.6* 14.0*  NEUTROABS 9.5*  --   --   HGB 12.7* 11.8* 10.7*  HCT 36.5* 34.1* 31.7*  MCV 92.0 91.9 92.2  PLT 199 188 163   Cardiac Enzymes:  Recent Labs  01/30/17 0940  TROPONINI 0.03*   Assessment/Plan:    1. Generalized muscle weakness Has worked well with PT/ OT.Will discharge with home health PT/OT to continue with ROM, Exercise, Gait stability and muscle strengthening. No DME required.Fall and safety precautions.   2. Benign prostatic hyperplasia  Continue on Flomax.  3. Right hand pain Continue on Norco 5/325 mg Tablet one every 6 HRS PRN.    4. Avitaminosis D Continue vit D supplements.   5. Slow transit constipation Current regimen effective. Continue to encourage oral and fluid intake.   6. History of CVA with residual deficit Continue on Plavix.     Patient is being discharged with the following home health services:   -PT/OT for ROM, exercise, gait stability and muscle strengthening  Patient is being discharged with the following durable medical equipment:   - No required.   Patient has been advised to f/u with their PCP in 1-2 weeks to for a transitions of care visit.Social services at their facility was responsible for arranging this appointment.  Pt was provided with adequate prescriptions of noncontrolled medications to reach the scheduled appointment.For controlled substances, a limited supply was provided as appropriate for the individual patient. If the pt normally receives these medications from a pain clinic or has a contract with another physician, these medications should be received from that clinic or physician only).    Future labs/tests needed:  CBC, BMP in 1-2 weeks PCP

## 2017-03-07 NOTE — Progress Notes (Signed)
Patient ID: Matthew Bright, male   DOB: 09-Nov-1926, 83 y.o.   MRN: [id number]

## 2017-03-10 DIAGNOSIS — G8191 Hemiplegia, unspecified affecting right dominant side: Secondary | ICD-10-CM | POA: Diagnosis not present

## 2017-03-10 DIAGNOSIS — F39 Unspecified mood [affective] disorder: Secondary | ICD-10-CM | POA: Diagnosis not present

## 2017-03-10 DIAGNOSIS — N400 Enlarged prostate without lower urinary tract symptoms: Secondary | ICD-10-CM

## 2017-03-10 DIAGNOSIS — I6529 Occlusion and stenosis of unspecified carotid artery: Secondary | ICD-10-CM

## 2017-03-10 DIAGNOSIS — I951 Orthostatic hypotension: Secondary | ICD-10-CM

## 2017-04-11 DIAGNOSIS — G8191 Hemiplegia, unspecified affecting right dominant side: Secondary | ICD-10-CM | POA: Diagnosis not present

## 2017-04-11 DIAGNOSIS — I951 Orthostatic hypotension: Secondary | ICD-10-CM | POA: Diagnosis not present

## 2017-04-11 DIAGNOSIS — M159 Polyosteoarthritis, unspecified: Secondary | ICD-10-CM | POA: Diagnosis not present

## 2017-04-11 DIAGNOSIS — I6529 Occlusion and stenosis of unspecified carotid artery: Secondary | ICD-10-CM

## 2017-04-11 DIAGNOSIS — N400 Enlarged prostate without lower urinary tract symptoms: Secondary | ICD-10-CM | POA: Diagnosis not present

## 2017-04-19 IMAGING — MR MR CERVICAL SPINE WO/W CM
8 series · 45 of 48 positions shown · IV contrast (multihance)
Comparison: Neck CT scan 02/21/2015. Cervical spine MRI 02/24/2015.

CLINICAL DATA: History of 2 falls in the past 2 weeks. Right hand
weakness and loss of sensation for 3 months. Intermittent left neck
pain. History of prior cervical fusion.

EXAM:
MRI CERVICAL SPINE WITHOUT AND WITH CONTRAST
TECHNIQUE: Multiplanar and multiecho pulse sequences of the cervical spine, to
include the craniocervical junction and cervicothoracic junction,
were obtained without and with intravenous contrast.
CONTRAST:  15 ml MULTIHANCE GADOBENATE DIMEGLUMINE 529 MG/ML IV SOLN

[Series 3: T2 · sagittal · 3.0mm · 0.70mm/px · 4 of 17 slices shown (1 of 2)]
[im 1/17]
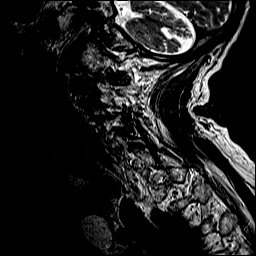
[im 6/17]
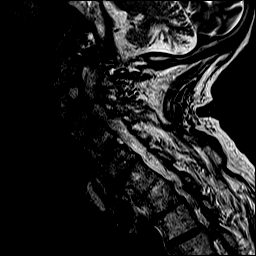
[im 11/17]
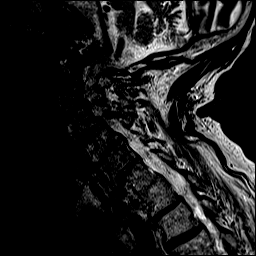
[im 17/17]
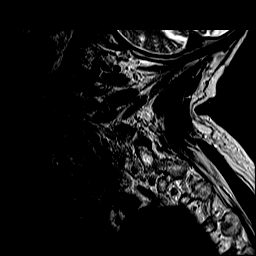

[Series 4: T1 · sagittal · 3.0mm · 0.70mm/px · 4 of 17 slices shown (1 of 3)]
[im 1/17]
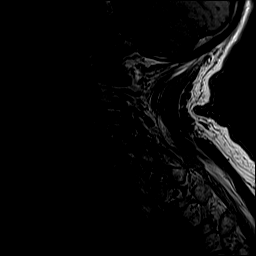
[im 6/17]
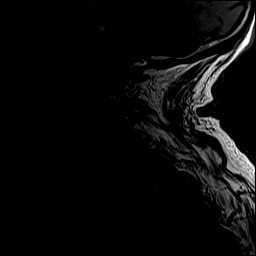
[im 11/17]
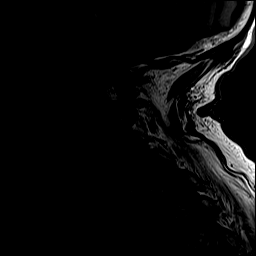
[im 17/17]
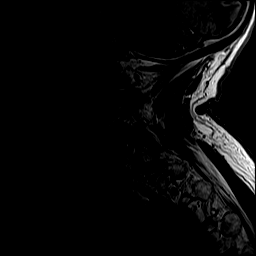

[Series 5: STIR · sagittal · 3.0mm · 0.70mm/px · 4 of 17 slices shown]
[im 1/17]
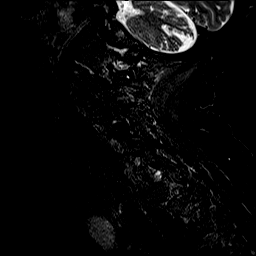
[im 6/17]
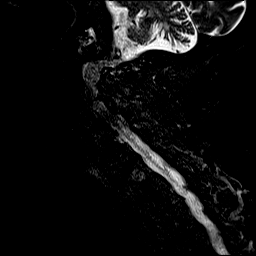
[im 11/17]
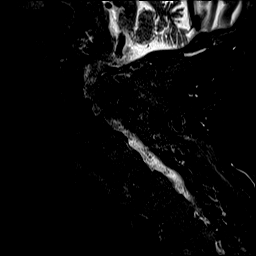
[im 17/17]
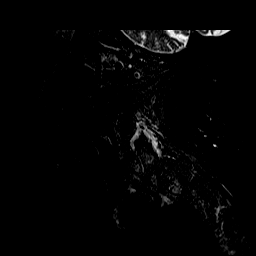

[Series 6: T2 · axial · 3.0mm · 0.70mm/px · z∈[-46,+50]mm · 8 of 29 slices shown (2 of 2)]
[im 1/29]
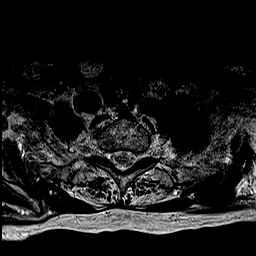
[im 5/29]
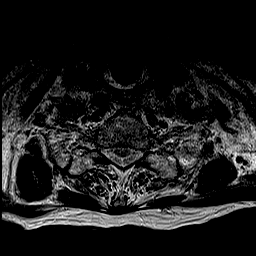
[im 9/29]
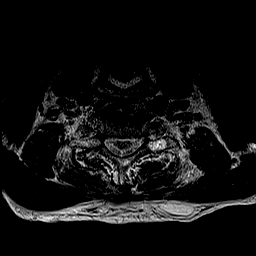
[im 13/29]
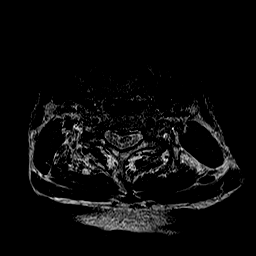
[im 17/29]
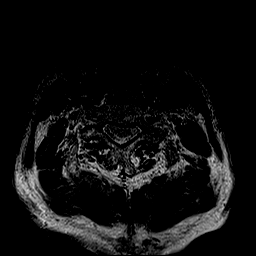
[im 21/29]
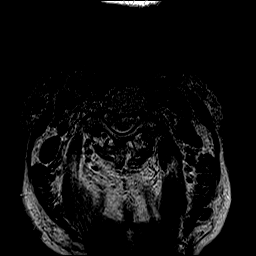
[im 25/29]
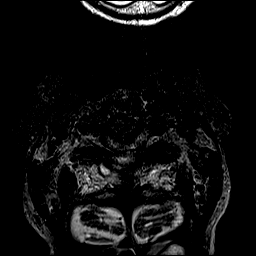
[im 29/29]
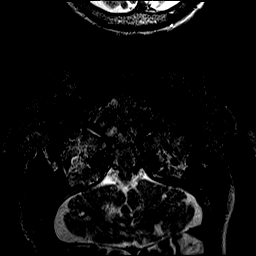

[Series 7: mpgr ax · axial · 3.0mm · 0.35mm/px · z∈[-46,+9]mm · 5 of 29 slices shown]
[im 1/29]
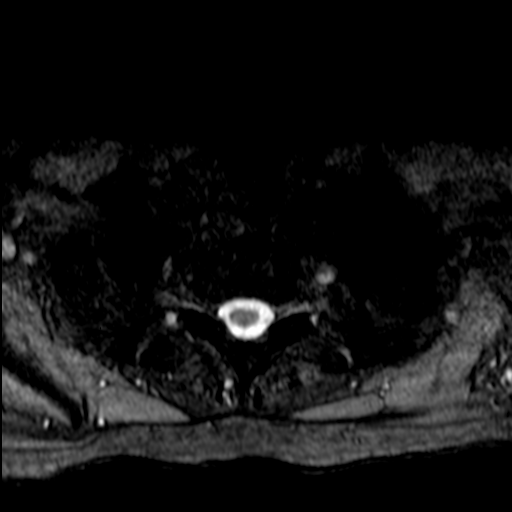
[im 5/29]
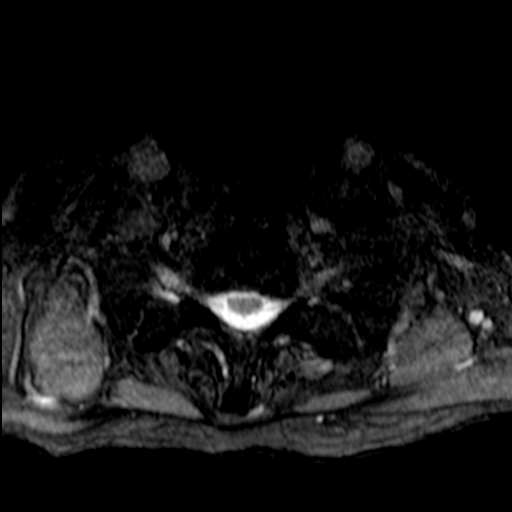
[im 9/29]
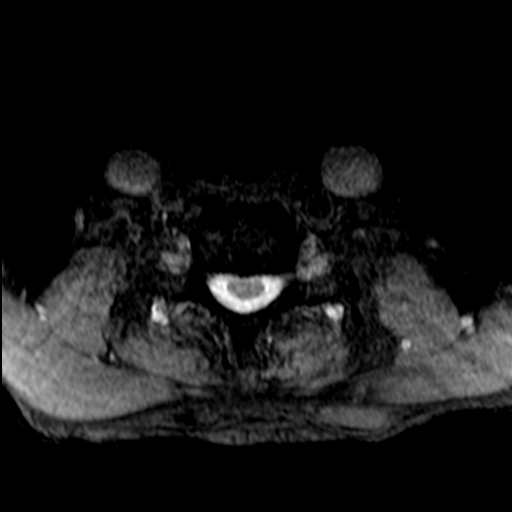
[im 13/29]
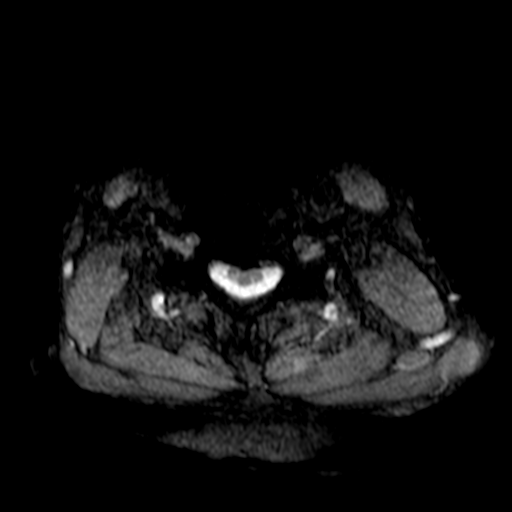
[im 17/29]
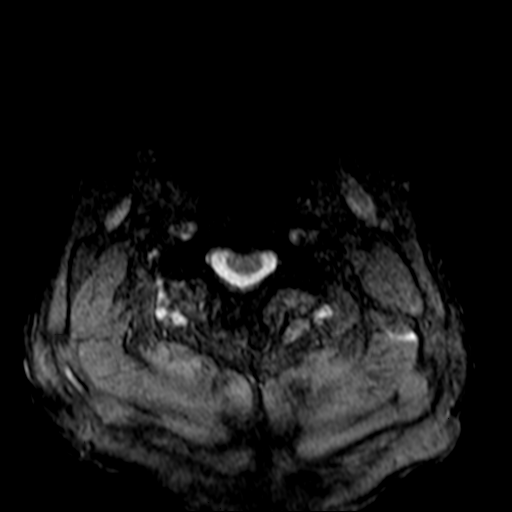

[Series 8: T1 · axial · 3.0mm · 0.70mm/px · z∈[-46,+50]mm · 8 of 29 slices shown (2 of 3)]
[im 1/29]
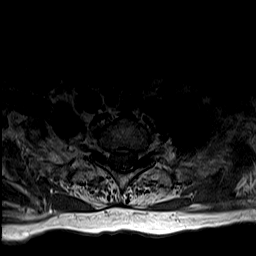
[im 5/29]
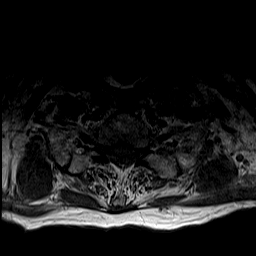
[im 9/29]
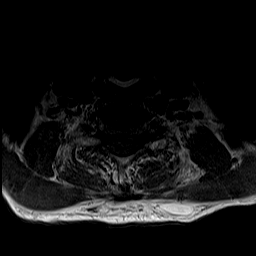
[im 13/29]
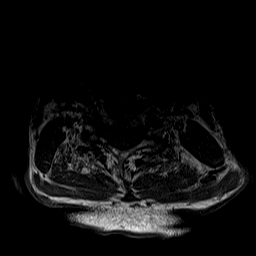
[im 17/29]
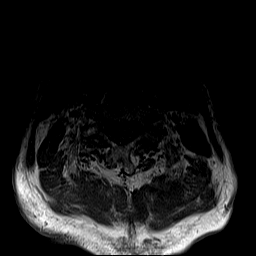
[im 21/29]
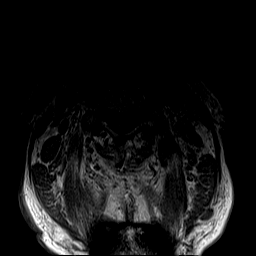
[im 25/29]
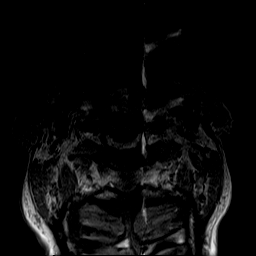
[im 29/29]
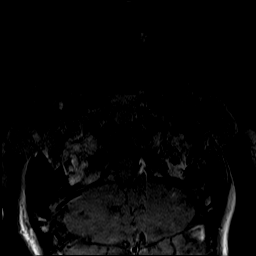

[Series 9: T1 fat-sat post-contrast · sagittal · 3.0mm · 0.70mm/px · 4 of 17 slices shown]
[im 1/17]
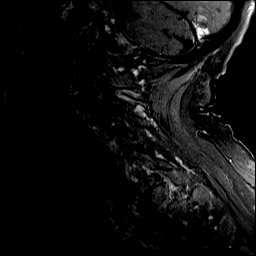
[im 6/17]
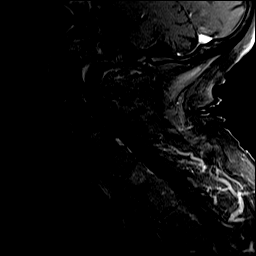
[im 11/17]
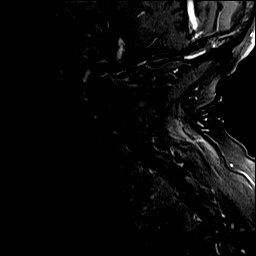
[im 17/17]
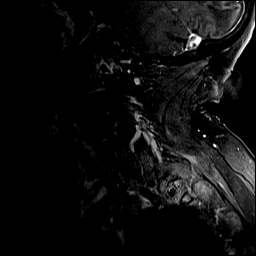

[Series 10: T1 · axial · 3.0mm · 0.70mm/px · z∈[-46,+50]mm · 8 of 29 slices shown (3 of 3)]
[im 1/29]
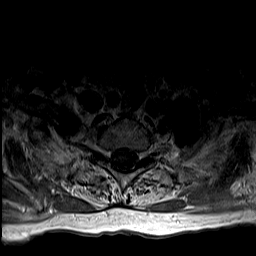
[im 5/29]
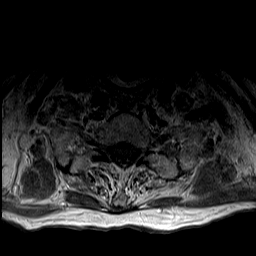
[im 9/29]
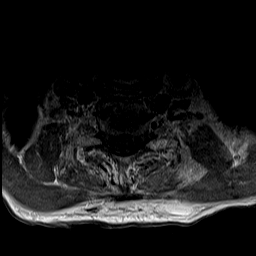
[im 13/29]
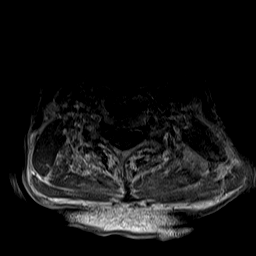
[im 17/29]
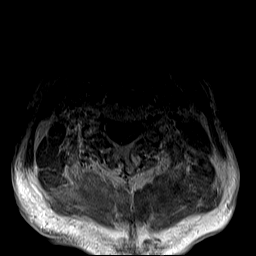
[im 21/29]
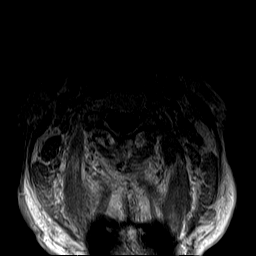
[im 25/29]
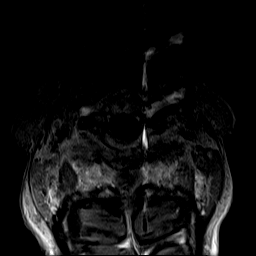
[im 29/29]
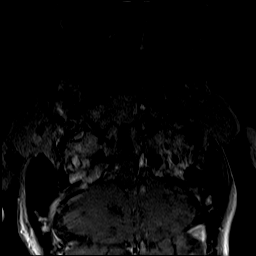

[45 of 48 positions shown; findings below may reference images not displayed]

FINDINGS: Alignment: The lateral masses of C1 are retrolisthesis approximately
1.0 cm on the lateral masses of C2. Facet mediated 0.3 cm
anterolisthesis C4 on C5 is also identified.

Vertebrae: No fracture or worrisome lesion.

Cord: There is thinning and increased T2 signal in the cord is seen
about the C5-6 level, not notably changed since the prior MRI.

Posterior Fossa, vertebral arteries, paraspinal tissues:
Retrolisthesis of C1 on C2 and bulky calcification of the transverse
ligament of the atlas cause kinking and deformity of the cord. This
finding is new since the prior MRI but the appearance is not grossly
changed compared to the prior CT.

Disc levels:

C2-3: Advanced bilateral facet degenerative change is worse on the
left. Shallow disc bulge endplate spur narrow but do not efface the
ventral thecal sac. The foramina are markedly narrowed.

C3-4: Advanced bilateral facet degenerative disease is present and
there is a disc osteophyte complex. The ventral cord is mildly
flattened. The foramina appear severely narrowed.

C4-5: Advanced bilateral facet degenerative change is seen. The disc
is uncovered with a bulge. The ventral thecal sac is effaced. The
foramina are severely narrowed.

C5-6: Large calcified left paracentral disc protrusion and bulky
left uncovertebral disease are seen. The left side of the ventral
thecal sac is effaced. The foramina appear severely narrowed
bilaterally.

C6-7: Disc osteophyte complex effaces the ventral thecal sac.
Uncovertebral disease is seen. Moderate to moderately severe
foraminal narrowing appears worse on the left.

C7-T1: This level is fused. The central canal and foramina are open.
IMPRESSION: Bulky calcification of the transverse ligament of the atlas and
retrolisthesis of the lateral masses of C1 on C2 cause marked
narrowing at the C1-2 level where the cord is kinked and deformed.
The appearance is not grossly changed compared to the 2346 CT. This
level is imaged in the sagittal plane only.

Marked multilevel spondylosis of the cervical spine appears worst at
C2-3, C3-4 and C4-5 as described above. Degenerative change appears
somewhat worse at C3-4 and C4-5 compared to the prior MRI.

Marked left foraminal narrowing C5-6, unchanged.

Chronic myelomalacia of the cervical cord centered about the C5-6
level, unchanged.

## 2017-04-27 DIAGNOSIS — R2231 Localized swelling, mass and lump, right upper limb: Secondary | ICD-10-CM | POA: Diagnosis not present

## 2017-04-27 DIAGNOSIS — M1990 Unspecified osteoarthritis, unspecified site: Secondary | ICD-10-CM | POA: Diagnosis not present

## 2017-04-27 DIAGNOSIS — I69359 Hemiplegia and hemiparesis following cerebral infarction affecting unspecified side: Secondary | ICD-10-CM | POA: Diagnosis not present

## 2017-06-10 DIAGNOSIS — I69354 Hemiplegia and hemiparesis following cerebral infarction affecting left non-dominant side: Secondary | ICD-10-CM

## 2017-06-10 DIAGNOSIS — M1990 Unspecified osteoarthritis, unspecified site: Secondary | ICD-10-CM

## 2017-06-10 DIAGNOSIS — N401 Enlarged prostate with lower urinary tract symptoms: Secondary | ICD-10-CM

## 2017-06-10 DIAGNOSIS — I951 Orthostatic hypotension: Secondary | ICD-10-CM

## 2017-08-11 DIAGNOSIS — G8191 Hemiplegia, unspecified affecting right dominant side: Secondary | ICD-10-CM | POA: Diagnosis not present

## 2017-08-11 DIAGNOSIS — N400 Enlarged prostate without lower urinary tract symptoms: Secondary | ICD-10-CM | POA: Diagnosis not present

## 2017-08-11 DIAGNOSIS — I2510 Atherosclerotic heart disease of native coronary artery without angina pectoris: Secondary | ICD-10-CM

## 2017-08-11 DIAGNOSIS — I951 Orthostatic hypotension: Secondary | ICD-10-CM

## 2017-08-11 DIAGNOSIS — M159 Polyosteoarthritis, unspecified: Secondary | ICD-10-CM

## 2017-09-22 DIAGNOSIS — G8191 Hemiplegia, unspecified affecting right dominant side: Secondary | ICD-10-CM

## 2017-09-22 DIAGNOSIS — G2581 Restless legs syndrome: Secondary | ICD-10-CM

## 2017-09-22 DIAGNOSIS — M159 Polyosteoarthritis, unspecified: Secondary | ICD-10-CM

## 2017-09-22 DIAGNOSIS — N400 Enlarged prostate without lower urinary tract symptoms: Secondary | ICD-10-CM

## 2017-12-09 DIAGNOSIS — N401 Enlarged prostate with lower urinary tract symptoms: Secondary | ICD-10-CM | POA: Diagnosis not present

## 2017-12-09 DIAGNOSIS — H25811 Combined forms of age-related cataract, right eye: Secondary | ICD-10-CM | POA: Diagnosis not present

## 2017-12-09 DIAGNOSIS — I2510 Atherosclerotic heart disease of native coronary artery without angina pectoris: Secondary | ICD-10-CM

## 2017-12-09 DIAGNOSIS — I69390 Apraxia following cerebral infarction: Secondary | ICD-10-CM | POA: Diagnosis not present

## 2017-12-09 DIAGNOSIS — M1990 Unspecified osteoarthritis, unspecified site: Secondary | ICD-10-CM | POA: Diagnosis not present

## 2018-02-09 DIAGNOSIS — M189 Osteoarthritis of first carpometacarpal joint, unspecified: Secondary | ICD-10-CM | POA: Diagnosis not present

## 2018-02-09 DIAGNOSIS — G2581 Restless legs syndrome: Secondary | ICD-10-CM | POA: Diagnosis not present

## 2018-02-09 DIAGNOSIS — G8191 Hemiplegia, unspecified affecting right dominant side: Secondary | ICD-10-CM | POA: Diagnosis not present

## 2018-02-09 DIAGNOSIS — I6529 Occlusion and stenosis of unspecified carotid artery: Secondary | ICD-10-CM | POA: Diagnosis not present

## 2018-02-09 DIAGNOSIS — N400 Enlarged prostate without lower urinary tract symptoms: Secondary | ICD-10-CM | POA: Diagnosis not present

## 2018-04-12 DIAGNOSIS — N401 Enlarged prostate with lower urinary tract symptoms: Secondary | ICD-10-CM | POA: Diagnosis not present

## 2018-04-12 DIAGNOSIS — M1990 Unspecified osteoarthritis, unspecified site: Secondary | ICD-10-CM | POA: Diagnosis not present

## 2018-04-12 DIAGNOSIS — I2510 Atherosclerotic heart disease of native coronary artery without angina pectoris: Secondary | ICD-10-CM | POA: Diagnosis not present

## 2018-04-12 DIAGNOSIS — I69354 Hemiplegia and hemiparesis following cerebral infarction affecting left non-dominant side: Secondary | ICD-10-CM | POA: Diagnosis not present

## 2018-06-01 DIAGNOSIS — I6529 Occlusion and stenosis of unspecified carotid artery: Secondary | ICD-10-CM

## 2018-06-01 DIAGNOSIS — G2581 Restless legs syndrome: Secondary | ICD-10-CM

## 2018-06-01 DIAGNOSIS — G8191 Hemiplegia, unspecified affecting right dominant side: Secondary | ICD-10-CM | POA: Diagnosis not present

## 2018-06-01 DIAGNOSIS — N400 Enlarged prostate without lower urinary tract symptoms: Secondary | ICD-10-CM | POA: Diagnosis not present

## 2018-07-14 DIAGNOSIS — G2581 Restless legs syndrome: Secondary | ICD-10-CM | POA: Diagnosis not present

## 2018-08-07 DIAGNOSIS — L309 Dermatitis, unspecified: Secondary | ICD-10-CM | POA: Diagnosis not present

## 2018-08-11 DIAGNOSIS — N401 Enlarged prostate with lower urinary tract symptoms: Secondary | ICD-10-CM | POA: Diagnosis not present

## 2018-08-11 DIAGNOSIS — I69398 Other sequelae of cerebral infarction: Secondary | ICD-10-CM

## 2018-08-11 DIAGNOSIS — G2581 Restless legs syndrome: Secondary | ICD-10-CM

## 2018-08-11 DIAGNOSIS — I6529 Occlusion and stenosis of unspecified carotid artery: Secondary | ICD-10-CM

## 2018-08-11 DIAGNOSIS — M1990 Unspecified osteoarthritis, unspecified site: Secondary | ICD-10-CM

## 2018-09-21 DIAGNOSIS — I6529 Occlusion and stenosis of unspecified carotid artery: Secondary | ICD-10-CM | POA: Diagnosis not present

## 2018-09-21 DIAGNOSIS — G2581 Restless legs syndrome: Secondary | ICD-10-CM | POA: Diagnosis not present

## 2018-09-21 DIAGNOSIS — N400 Enlarged prostate without lower urinary tract symptoms: Secondary | ICD-10-CM | POA: Diagnosis not present

## 2018-09-21 DIAGNOSIS — G8191 Hemiplegia, unspecified affecting right dominant side: Secondary | ICD-10-CM | POA: Diagnosis not present

## 2018-11-29 DIAGNOSIS — B351 Tinea unguium: Secondary | ICD-10-CM | POA: Diagnosis not present

## 2018-11-29 DIAGNOSIS — I6521 Occlusion and stenosis of right carotid artery: Secondary | ICD-10-CM

## 2018-11-29 DIAGNOSIS — N401 Enlarged prostate with lower urinary tract symptoms: Secondary | ICD-10-CM

## 2018-11-29 DIAGNOSIS — I69398 Other sequelae of cerebral infarction: Secondary | ICD-10-CM

## 2018-11-29 DIAGNOSIS — M1990 Unspecified osteoarthritis, unspecified site: Secondary | ICD-10-CM

## 2018-11-29 DIAGNOSIS — G2581 Restless legs syndrome: Secondary | ICD-10-CM | POA: Diagnosis not present

## 2018-12-06 DIAGNOSIS — R55 Syncope and collapse: Secondary | ICD-10-CM | POA: Diagnosis not present

## 2018-12-12 DIAGNOSIS — G3184 Mild cognitive impairment, so stated: Secondary | ICD-10-CM

## 2018-12-28 DIAGNOSIS — R070 Pain in throat: Secondary | ICD-10-CM

## 2019-01-11 DIAGNOSIS — G40509 Epileptic seizures related to external causes, not intractable, without status epilepticus: Secondary | ICD-10-CM | POA: Diagnosis not present

## 2019-02-05 DIAGNOSIS — E441 Mild protein-calorie malnutrition: Secondary | ICD-10-CM | POA: Diagnosis not present

## 2019-02-05 DIAGNOSIS — N400 Enlarged prostate without lower urinary tract symptoms: Secondary | ICD-10-CM | POA: Diagnosis not present

## 2019-02-05 DIAGNOSIS — I6529 Occlusion and stenosis of unspecified carotid artery: Secondary | ICD-10-CM

## 2019-02-05 DIAGNOSIS — G8191 Hemiplegia, unspecified affecting right dominant side: Secondary | ICD-10-CM

## 2019-02-05 DIAGNOSIS — G40509 Epileptic seizures related to external causes, not intractable, without status epilepticus: Secondary | ICD-10-CM | POA: Diagnosis not present

## 2019-04-03 DIAGNOSIS — I872 Venous insufficiency (chronic) (peripheral): Secondary | ICD-10-CM | POA: Diagnosis not present

## 2019-04-10 DIAGNOSIS — R0602 Shortness of breath: Secondary | ICD-10-CM | POA: Diagnosis not present

## 2019-04-11 DIAGNOSIS — G2581 Restless legs syndrome: Secondary | ICD-10-CM

## 2019-04-11 DIAGNOSIS — G40901 Epilepsy, unspecified, not intractable, with status epilepticus: Secondary | ICD-10-CM

## 2019-04-11 DIAGNOSIS — E43 Unspecified severe protein-calorie malnutrition: Secondary | ICD-10-CM | POA: Diagnosis not present

## 2019-04-11 DIAGNOSIS — M1990 Unspecified osteoarthritis, unspecified site: Secondary | ICD-10-CM

## 2019-04-11 DIAGNOSIS — I6529 Occlusion and stenosis of unspecified carotid artery: Secondary | ICD-10-CM

## 2019-04-11 DIAGNOSIS — N401 Enlarged prostate with lower urinary tract symptoms: Secondary | ICD-10-CM

## 2019-04-11 DIAGNOSIS — K219 Gastro-esophageal reflux disease without esophagitis: Secondary | ICD-10-CM

## 2019-04-19 DIAGNOSIS — J181 Lobar pneumonia, unspecified organism: Secondary | ICD-10-CM

## 2019-04-23 DIAGNOSIS — J181 Lobar pneumonia, unspecified organism: Secondary | ICD-10-CM | POA: Diagnosis not present

## 2019-05-01 DIAGNOSIS — J9601 Acute respiratory failure with hypoxia: Secondary | ICD-10-CM

## 2019-05-02 DIAGNOSIS — J9601 Acute respiratory failure with hypoxia: Secondary | ICD-10-CM

## 2019-05-02 DIAGNOSIS — I5030 Unspecified diastolic (congestive) heart failure: Secondary | ICD-10-CM | POA: Diagnosis not present

## 2019-05-03 DIAGNOSIS — I5020 Unspecified systolic (congestive) heart failure: Secondary | ICD-10-CM | POA: Diagnosis not present

## 2019-05-24 DIAGNOSIS — R4182 Altered mental status, unspecified: Secondary | ICD-10-CM

## 2019-06-14 DIAGNOSIS — I5032 Chronic diastolic (congestive) heart failure: Secondary | ICD-10-CM

## 2019-06-14 DIAGNOSIS — G8191 Hemiplegia, unspecified affecting right dominant side: Secondary | ICD-10-CM

## 2019-06-14 DIAGNOSIS — I6521 Occlusion and stenosis of right carotid artery: Secondary | ICD-10-CM

## 2019-06-14 DIAGNOSIS — G40909 Epilepsy, unspecified, not intractable, without status epilepticus: Secondary | ICD-10-CM

## 2019-06-21 DIAGNOSIS — H6120 Impacted cerumen, unspecified ear: Secondary | ICD-10-CM

## 2019-08-15 DIAGNOSIS — N401 Enlarged prostate with lower urinary tract symptoms: Secondary | ICD-10-CM | POA: Diagnosis not present

## 2019-08-15 DIAGNOSIS — K219 Gastro-esophageal reflux disease without esophagitis: Secondary | ICD-10-CM | POA: Diagnosis not present

## 2019-08-15 DIAGNOSIS — G40901 Epilepsy, unspecified, not intractable, with status epilepticus: Secondary | ICD-10-CM

## 2019-08-15 DIAGNOSIS — I5030 Unspecified diastolic (congestive) heart failure: Secondary | ICD-10-CM | POA: Diagnosis not present

## 2019-08-15 DIAGNOSIS — I6529 Occlusion and stenosis of unspecified carotid artery: Secondary | ICD-10-CM

## 2019-08-15 DIAGNOSIS — I69351 Hemiplegia and hemiparesis following cerebral infarction affecting right dominant side: Secondary | ICD-10-CM

## 2019-08-15 DIAGNOSIS — M1990 Unspecified osteoarthritis, unspecified site: Secondary | ICD-10-CM | POA: Diagnosis not present

## 2019-09-14 DIAGNOSIS — R410 Disorientation, unspecified: Secondary | ICD-10-CM | POA: Diagnosis not present

## 2019-09-19 DIAGNOSIS — S91209A Unspecified open wound of unspecified toe(s) with damage to nail, initial encounter: Secondary | ICD-10-CM

## 2019-09-20 DIAGNOSIS — R197 Diarrhea, unspecified: Secondary | ICD-10-CM

## 2019-10-09 DIAGNOSIS — G8191 Hemiplegia, unspecified affecting right dominant side: Secondary | ICD-10-CM | POA: Diagnosis not present

## 2019-10-09 DIAGNOSIS — N400 Enlarged prostate without lower urinary tract symptoms: Secondary | ICD-10-CM | POA: Diagnosis not present

## 2019-10-09 DIAGNOSIS — G4089 Other seizures: Secondary | ICD-10-CM

## 2019-10-09 DIAGNOSIS — K219 Gastro-esophageal reflux disease without esophagitis: Secondary | ICD-10-CM | POA: Diagnosis not present

## 2019-10-09 DIAGNOSIS — I5032 Chronic diastolic (congestive) heart failure: Secondary | ICD-10-CM | POA: Diagnosis not present

## 2019-10-18 DIAGNOSIS — R569 Unspecified convulsions: Secondary | ICD-10-CM | POA: Diagnosis not present

## 2019-11-08 DIAGNOSIS — D692 Other nonthrombocytopenic purpura: Secondary | ICD-10-CM

## 2019-11-08 DIAGNOSIS — K219 Gastro-esophageal reflux disease without esophagitis: Secondary | ICD-10-CM

## 2019-11-19 DIAGNOSIS — I6930 Unspecified sequelae of cerebral infarction: Secondary | ICD-10-CM

## 2019-12-21 DEATH — deceased
